# Patient Record
Sex: Female | Born: 1940 | Race: White | Hispanic: No | Smoking: Never smoker
Health system: Southern US, Community
[De-identification: ages and names within clinical notes are randomized; demographics above are authoritative.]

## PROBLEM LIST (undated history)

## (undated) DIAGNOSIS — Z8709 Personal history of other diseases of the respiratory system: Secondary | ICD-10-CM

## (undated) DIAGNOSIS — R011 Cardiac murmur, unspecified: Secondary | ICD-10-CM

## (undated) DIAGNOSIS — I350 Nonrheumatic aortic (valve) stenosis: Secondary | ICD-10-CM

## (undated) DIAGNOSIS — H269 Unspecified cataract: Secondary | ICD-10-CM

## (undated) DIAGNOSIS — Z953 Presence of xenogenic heart valve: Secondary | ICD-10-CM

## (undated) DIAGNOSIS — I5032 Chronic diastolic (congestive) heart failure: Secondary | ICD-10-CM

## (undated) DIAGNOSIS — E785 Hyperlipidemia, unspecified: Secondary | ICD-10-CM

## (undated) DIAGNOSIS — R590 Localized enlarged lymph nodes: Secondary | ICD-10-CM

## (undated) DIAGNOSIS — K759 Inflammatory liver disease, unspecified: Secondary | ICD-10-CM

## (undated) DIAGNOSIS — Q2381 Bicuspid aortic valve: Secondary | ICD-10-CM

## (undated) DIAGNOSIS — R112 Nausea with vomiting, unspecified: Secondary | ICD-10-CM

## (undated) DIAGNOSIS — Q231 Congenital insufficiency of aortic valve: Secondary | ICD-10-CM

## (undated) DIAGNOSIS — I10 Essential (primary) hypertension: Secondary | ICD-10-CM

## (undated) DIAGNOSIS — Z9889 Other specified postprocedural states: Secondary | ICD-10-CM

## (undated) DIAGNOSIS — M1990 Unspecified osteoarthritis, unspecified site: Secondary | ICD-10-CM

## (undated) HISTORY — PX: TONSILLECTOMY: SUR1361

## (undated) HISTORY — DX: Localized enlarged lymph nodes: R59.0

## (undated) HISTORY — DX: Bicuspid aortic valve: Q23.81

## (undated) HISTORY — DX: Congenital insufficiency of aortic valve: Q23.1

## (undated) HISTORY — PX: TUBAL LIGATION: SHX77

## (undated) HISTORY — PX: DILATION AND CURETTAGE OF UTERUS: SHX78

## (undated) HISTORY — DX: Nonrheumatic aortic (valve) stenosis: I35.0

## (undated) HISTORY — DX: Personal history of other diseases of the respiratory system: Z87.09

---

## 1983-06-03 HISTORY — PX: ABDOMINAL HYSTERECTOMY: SHX81

## 1983-06-03 HISTORY — PX: CARPAL TUNNEL RELEASE: SHX101

## 2010-06-02 DIAGNOSIS — R011 Cardiac murmur, unspecified: Secondary | ICD-10-CM

## 2010-06-02 HISTORY — DX: Cardiac murmur, unspecified: R01.1

## 2011-10-01 HISTORY — PX: HAMMER TOE SURGERY: SHX385

## 2012-03-16 ENCOUNTER — Encounter (INDEPENDENT_AMBULATORY_CARE_PROVIDER_SITE_OTHER): Payer: Self-pay

## 2012-04-07 ENCOUNTER — Ambulatory Visit (INDEPENDENT_AMBULATORY_CARE_PROVIDER_SITE_OTHER): Payer: Medicare Other | Admitting: Surgery

## 2012-04-07 ENCOUNTER — Encounter (INDEPENDENT_AMBULATORY_CARE_PROVIDER_SITE_OTHER): Payer: Self-pay | Admitting: Surgery

## 2012-04-07 VITALS — BP 126/80 | HR 80 | Temp 98.0°F | Resp 18 | Ht 65.0 in | Wt 201.0 lb

## 2012-04-07 DIAGNOSIS — R590 Localized enlarged lymph nodes: Secondary | ICD-10-CM | POA: Insufficient documentation

## 2012-04-07 DIAGNOSIS — R599 Enlarged lymph nodes, unspecified: Secondary | ICD-10-CM

## 2012-04-07 NOTE — Patient Instructions (Signed)
See me in six weeks for a recheck

## 2012-04-08 NOTE — Progress Notes (Signed)
NAME: [name] DOB: Jun 29, 1940 MRN: [id number]                                                                                      DATE: 04/07/2012  PCP: Arma Heading, MD Referring Provider: Arma Heading, MD  IMPRESSION:  Left supraclavicular lymphadenopathy, probably benign  PLAN:   I reviewed the options with the patient. The area is not enlarged any of the last several weeks. It is asymptomatic. It looked negative on the CT scan for malignancy. In I think the alternatives are surgical excisional biopsy or six-week followup visit and if no improvement then consider biopsy the patient was agreeable to that plan and will come back in 6 weeks                 CC:  Chief Complaint  Patient presents with  . Lymphadenopathy    Left    HPI:  Jasmin Allen is a 71 y.o.  female who presents for evaluation of left cervical supraclavicular enlarged lymph node. This was found on her routine physical by her primary physician. The patient is unaware of any enlarged lymph node was having no symptoms. He did not improve after a short course of antibiotics. A CT scan of the neck was done which showed no obvious malignancy the cervical prominent lymph nodes. Surgical consultation was requested.  PMH:  has no past medical history on file.  PSH:   has past surgical history that includes Carpal tunnel release (1985); Abdominal hysterectomy (1985); and Hammer toe surgery (10/01/2011).  ALLERGIES:   Allergies  Allergen Reactions  . Penicillins   . Sulfa Antibiotics     MEDICATIONS: Current outpatient prescriptions:aspirin 81 MG tablet, Take 81 mg by mouth daily., Disp: , Rfl: ;  b complex vitamins tablet, Take 1 tablet by mouth daily., Disp: , Rfl: ;  folic acid (FOLVITE) 400 MCG tablet, Take 400 mcg by mouth daily., Disp: , Rfl: ;  carvedilol (COREG) 6.25 MG tablet, , Disp: , Rfl: ;  hydrochlorothiazide (HYDRODIURIL) 25 MG tablet, , Disp: , Rfl: ;  losartan (COZAAR) 100 MG tablet, ,  Disp: , Rfl:  pravastatin (PRAVACHOL) 20 MG tablet, , Disp: , Rfl:   ROS: She has filled out our 12 point review of systems and it is negative . EXAM:   Vital signs:BP 126/80  Pulse 80  Temp 98 F (36.7 C) (Oral)  Resp 18  Ht 5\' 5"  (1.651 m)  Wt 201 lb (91.173 kg)  BMI 33.45 kg/m2 General: The patient is alert oriented and healthy appearing, NAD. Neck: The neck is supple and there is no thyromegaly. The thyroid gland is normal. There is no adenopathy on the right side of the neck that I can palpate either anterior posterior cervical or supraclavicular. On the left side there is a node in the left clavicular area there is approximately 1.5 cm. It is not particularly hard. It seems mobile. It is not tender. No other cervical adenopathy is noted. Axilla: there is a question of some mild fatty type lymph nodes in the right axilla. Nothing feels pathologic.  DATA REVIEWED:  I reviewed the CT scan report.  Djuna Frechette J 04/08/2012  CC: Arma Heading, MD, Arma Heading, MD

## 2012-05-19 ENCOUNTER — Ambulatory Visit (INDEPENDENT_AMBULATORY_CARE_PROVIDER_SITE_OTHER): Payer: Medicare Other | Admitting: Surgery

## 2012-05-19 ENCOUNTER — Encounter (INDEPENDENT_AMBULATORY_CARE_PROVIDER_SITE_OTHER): Payer: Self-pay | Admitting: Surgery

## 2012-05-19 VITALS — BP 136/84 | HR 72 | Temp 97.7°F | Resp 16 | Ht 65.0 in | Wt 202.2 lb

## 2012-05-19 DIAGNOSIS — R590 Localized enlarged lymph nodes: Secondary | ICD-10-CM

## 2012-05-19 DIAGNOSIS — R599 Enlarged lymph nodes, unspecified: Secondary | ICD-10-CM

## 2012-05-19 NOTE — Progress Notes (Signed)
NAME: Jasmin Allen DOB: 11/06/1940 MRN: [id number]                                                                                      DATE: 05/19/2012  PCP: Arma Heading, MD Referring Provider: Arma Heading, MD  IMPRESSION:  Left supraclavicular lymphadenopathy, probably benign  PLAN:  I again reviewed the options with the patient. There's been no change in the lymph node and after discussion we are going to set up excisional biopsy to be done as an outpatient. We discussed the risks and complications of surgery.                CC:  Chief Complaint  Patient presents with  . Mass    HPI:  Jasmin Allen is a 71 y.o.  female who presents forreevaluation of left cervical supraclavicular enlarged lymph node. This was found on her routine physical by her primary physician.I saw her 6 weeks ago and at that point thought that this was most likely benign. She comes back today for a 6 weeks followup. She does not think it has changed. He continues to be without symptoms.  PMH:  has no past medical history on file.  PSH:   has past surgical history that includes Carpal tunnel release (1985); Abdominal hysterectomy (1985); and Hammer toe surgery (10/01/2011).  ALLERGIES:   Allergies  Allergen Reactions  . Penicillins   . Sulfa Antibiotics     MEDICATIONS: Current outpatient prescriptions:aspirin 81 MG tablet, Take 81 mg by mouth daily., Disp: , Rfl: ;  b complex vitamins tablet, Take 1 tablet by mouth daily., Disp: , Rfl: ;  carvedilol (COREG) 6.25 MG tablet, , Disp: , Rfl: ;  folic acid (FOLVITE) 400 MCG tablet, Take 400 mcg by mouth daily., Disp: , Rfl: ;  hydrochlorothiazide (HYDRODIURIL) 25 MG tablet, , Disp: , Rfl: ;  losartan (COZAAR) 100 MG tablet, , Disp: , Rfl:  pravastatin (PRAVACHOL) 20 MG tablet, , Disp: , Rfl:   ROS: She has filled out our 12 point review of systems and it is negative . EXAM:   Vital signs:BP 136/84  Pulse 72  Temp 97.7 F (36.5 C)  Resp 16   Ht 5\' 5"  (1.651 m)  Wt 202 lb 3.2 oz (91.717 kg)  BMI 33.65 kg/m2 General: The patient is alert oriented and healthy appearing, NAD. Neck: The neck is supple and there is no thyromegaly. The thyroid gland is normal. There is no adenopathy on the right side of the neck that I can palpate either anterior posterior cervical or supraclavicular. On the left side there is a node in the left clavicular area there is approximately 1.5 cm. It is not particularly hard. It seems mobile. It is not tender. No other cervical adenopathy is noted. Axilla: there is a question of some mild fatty type lymph nodes in the right axilla. Nothing feels pathologic.This is the same exam as 6 weeks ago  DATA REVIEWED:  I reviewed the CT scan report.there is no evidence of malignancy although multiple chest subcentimeter nodes were noted.    Shellee Streng J 05/19/2012  CC: Arma Heading, MD, Arma Heading, MD

## 2012-05-19 NOTE — Patient Instructions (Signed)
We will schedule surgery to remove this small but enlarged lymph node from the left side of her neck

## 2012-06-09 ENCOUNTER — Encounter (HOSPITAL_BASED_OUTPATIENT_CLINIC_OR_DEPARTMENT_OTHER): Payer: Self-pay | Admitting: *Deleted

## 2012-06-09 NOTE — Progress Notes (Signed)
Pt lives [place]-cannot come into [place]-will need to come 2 hr early for bmet-ekg-will get notes from dr Samson Frederic pcp

## 2012-06-14 ENCOUNTER — Encounter (HOSPITAL_BASED_OUTPATIENT_CLINIC_OR_DEPARTMENT_OTHER): Payer: Self-pay | Admitting: Anesthesiology

## 2012-06-14 ENCOUNTER — Ambulatory Visit (HOSPITAL_BASED_OUTPATIENT_CLINIC_OR_DEPARTMENT_OTHER)
Admission: RE | Admit: 2012-06-14 | Discharge: 2012-06-14 | Disposition: A | Discharge status: Home / Self Care | Payer: Medicare Other | Source: Ambulatory Visit | Attending: Surgery | Admitting: Surgery

## 2012-06-14 ENCOUNTER — Ambulatory Visit (HOSPITAL_BASED_OUTPATIENT_CLINIC_OR_DEPARTMENT_OTHER): Payer: Medicare Other | Admitting: Anesthesiology

## 2012-06-14 ENCOUNTER — Encounter (HOSPITAL_BASED_OUTPATIENT_CLINIC_OR_DEPARTMENT_OTHER): Payer: Self-pay | Admitting: *Deleted

## 2012-06-14 ENCOUNTER — Encounter (HOSPITAL_BASED_OUTPATIENT_CLINIC_OR_DEPARTMENT_OTHER)
Admission: RE | Disposition: A | Discharge status: Home / Self Care | Payer: Self-pay | Source: Ambulatory Visit | Attending: Surgery

## 2012-06-14 DIAGNOSIS — Z7982 Long term (current) use of aspirin: Secondary | ICD-10-CM | POA: Insufficient documentation

## 2012-06-14 DIAGNOSIS — R590 Localized enlarged lymph nodes: Secondary | ICD-10-CM

## 2012-06-14 DIAGNOSIS — I10 Essential (primary) hypertension: Secondary | ICD-10-CM | POA: Insufficient documentation

## 2012-06-14 DIAGNOSIS — Z79899 Other long term (current) drug therapy: Secondary | ICD-10-CM | POA: Insufficient documentation

## 2012-06-14 DIAGNOSIS — Z6833 Body mass index (BMI) 33.0-33.9, adult: Secondary | ICD-10-CM | POA: Insufficient documentation

## 2012-06-14 DIAGNOSIS — R599 Enlarged lymph nodes, unspecified: Secondary | ICD-10-CM | POA: Insufficient documentation

## 2012-06-14 DIAGNOSIS — R011 Cardiac murmur, unspecified: Secondary | ICD-10-CM | POA: Insufficient documentation

## 2012-06-14 HISTORY — DX: Unspecified osteoarthritis, unspecified site: M19.90

## 2012-06-14 HISTORY — DX: Essential (primary) hypertension: I10

## 2012-06-14 HISTORY — DX: Hyperlipidemia, unspecified: E78.5

## 2012-06-14 HISTORY — DX: Cardiac murmur, unspecified: R01.1

## 2012-06-14 HISTORY — PX: LYMPH NODE BIOPSY: SHX201

## 2012-06-14 SURGERY — LYMPH NODE BIOPSY
Anesthesia: Monitor Anesthesia Care | Site: Neck | Laterality: Left | Wound class: Clean

## 2012-06-14 MED ORDER — ONDANSETRON HCL 4 MG/2ML IJ SOLN
INTRAMUSCULAR | Status: DC | PRN
  Administered 2012-06-14: 4 mg via INTRAVENOUS

## 2012-06-14 MED ORDER — FENTANYL CITRATE 0.05 MG/ML IJ SOLN: 25.0000 ug | INTRAMUSCULAR | Status: DC | PRN

## 2012-06-14 MED ORDER — MEPERIDINE HCL 25 MG/ML IJ SOLN: 6.2500 mg | INTRAMUSCULAR | Status: DC | PRN

## 2012-06-14 MED ORDER — LACTATED RINGERS IV SOLN
INTRAVENOUS | Status: DC
  Administered 2012-06-14: 10:00:00 via INTRAVENOUS

## 2012-06-14 MED ORDER — MIDAZOLAM HCL 5 MG/5ML IJ SOLN
INTRAMUSCULAR | Status: DC | PRN
  Administered 2012-06-14: 2 mg via INTRAVENOUS

## 2012-06-14 MED ORDER — OXYCODONE HCL 5 MG/5ML PO SOLN: 5.0000 mg | Freq: Once | ORAL | Status: DC | PRN

## 2012-06-14 MED ORDER — LIDOCAINE-EPINEPHRINE (PF) 1 %-1:200000 IJ SOLN
INTRAMUSCULAR | Status: DC | PRN
  Administered 2012-06-14: 10 mL

## 2012-06-14 MED ORDER — CHLORHEXIDINE GLUCONATE 4 % EX LIQD: 1.0000 "application " | Freq: Once | CUTANEOUS | Status: DC

## 2012-06-14 MED ORDER — FENTANYL CITRATE 0.05 MG/ML IJ SOLN
INTRAMUSCULAR | Status: DC | PRN
  Administered 2012-06-14 (×2): 50 ug via INTRAVENOUS

## 2012-06-14 MED ORDER — PROPOFOL 10 MG/ML IV EMUL
INTRAVENOUS | Status: DC | PRN
  Administered 2012-06-14: 75 ug/kg/min via INTRAVENOUS

## 2012-06-14 MED ORDER — MIDAZOLAM HCL 2 MG/2ML IJ SOLN: 0.5000 mg | Freq: Once | INTRAMUSCULAR | Status: DC | PRN

## 2012-06-14 MED ORDER — EPHEDRINE SULFATE 50 MG/ML IJ SOLN
INTRAMUSCULAR | Status: DC | PRN
  Administered 2012-06-14: 10 mg via INTRAVENOUS

## 2012-06-14 MED ORDER — OXYCODONE HCL 5 MG PO TABS: 5.0000 mg | ORAL_TABLET | Freq: Once | ORAL | Status: DC | PRN

## 2012-06-14 MED ORDER — PROMETHAZINE HCL 25 MG/ML IJ SOLN: 6.2500 mg | INTRAMUSCULAR | Status: DC | PRN

## 2012-06-14 SURGICAL SUPPLY — 44 items
BANDAGE ELASTIC 6 VELCRO ST LF (GAUZE/BANDAGES/DRESSINGS) ×2 IMPLANT
BLADE HEX COATED 2.75 (ELECTRODE) ×2 IMPLANT
BLADE SURG 15 STRL LF DISP TIS (BLADE) ×1 IMPLANT
BLADE SURG 15 STRL SS (BLADE) ×1
CANISTER SUCTION 1200CC (MISCELLANEOUS) IMPLANT
CHLORAPREP W/TINT 26ML (MISCELLANEOUS) ×2 IMPLANT
CLIP TI MEDIUM 6 (CLIP) IMPLANT
CLIP TI WIDE RED SMALL 6 (CLIP) IMPLANT
CLOTH BEACON ORANGE TIMEOUT ST (SAFETY) ×2 IMPLANT
COVER MAYO STAND STRL (DRAPES) ×2 IMPLANT
COVER TABLE BACK 60X90 (DRAPES) ×2 IMPLANT
DECANTER SPIKE VIAL GLASS SM (MISCELLANEOUS) ×2 IMPLANT
DERMABOND ADVANCED (GAUZE/BANDAGES/DRESSINGS) ×1
DERMABOND ADVANCED .7 DNX12 (GAUZE/BANDAGES/DRESSINGS) ×1 IMPLANT
DRAPE LAPAROTOMY TRNSV 102X78 (DRAPE) ×2 IMPLANT
DRAPE UTILITY XL STRL (DRAPES) ×2 IMPLANT
ELECT REM PT RETURN 9FT ADLT (ELECTROSURGICAL) ×2
ELECTRODE REM PT RTRN 9FT ADLT (ELECTROSURGICAL) ×1 IMPLANT
GLOVE BIOGEL PI IND STRL 7.0 (GLOVE) ×1 IMPLANT
GLOVE BIOGEL PI INDICATOR 7.0 (GLOVE) ×1
GLOVE ECLIPSE 7.0 STRL STRAW (GLOVE) ×4 IMPLANT
GLOVE EUDERMIC 7 POWDERFREE (GLOVE) ×2 IMPLANT
GOWN PREVENTION PLUS XLARGE (GOWN DISPOSABLE) ×6 IMPLANT
NEEDLE HYPO 25X1 1.5 SAFETY (NEEDLE) ×2 IMPLANT
NS IRRIG 1000ML POUR BTL (IV SOLUTION) IMPLANT
PACK BASIN DAY SURGERY FS (CUSTOM PROCEDURE TRAY) ×2 IMPLANT
PEN SKIN MARKING BROAD TIP (MISCELLANEOUS) IMPLANT
PENCIL BUTTON HOLSTER BLD 10FT (ELECTRODE) ×2 IMPLANT
SLEEVE SCD COMPRESS KNEE MED (MISCELLANEOUS) IMPLANT
SPONGE INTESTINAL PEANUT (DISPOSABLE) IMPLANT
SPONGE LAP 4X18 X RAY DECT (DISPOSABLE) ×2 IMPLANT
STAPLER VISISTAT 35W (STAPLE) IMPLANT
SUT MNCRL AB 4-0 PS2 18 (SUTURE) ×2 IMPLANT
SUT VIC AB 3-0 CT1 27 (SUTURE) ×1
SUT VIC AB 3-0 CT1 27XBRD (SUTURE) ×1 IMPLANT
SUT VIC AB 4-0 BRD 54 (SUTURE) ×2 IMPLANT
SUT VICRYL 3-0 CR8 SH (SUTURE) IMPLANT
SYR CONTROL 10ML LL (SYRINGE) ×2 IMPLANT
TAPE HYPAFIX 4 X10 (GAUZE/BANDAGES/DRESSINGS) IMPLANT
TOWEL OR 17X24 6PK STRL BLUE (TOWEL DISPOSABLE) ×2 IMPLANT
TOWEL OR NON WOVEN STRL DISP B (DISPOSABLE) ×2 IMPLANT
TUBE CONNECTING 20X1/4 (TUBING) IMPLANT
WATER STERILE IRR 1000ML POUR (IV SOLUTION) ×2 IMPLANT
YANKAUER SUCT BULB TIP NO VENT (SUCTIONS) IMPLANT

## 2012-06-14 NOTE — Interval H&P Note (Signed)
History and Physical Interval Note:  06/14/2012 10:30 AM  Jasmin Allen  has presented today for surgery, with the diagnosis of lymphadenopathy  The various methods of treatment have been discussed with the patient and family. After consideration of risks, benefits and other options for treatment, the patient has consented to  Procedure(s) (LRB) with comments: LYMPH NODE BIOPSY (Left) - remove left neck lymph node as a surgical intervention .  The patient's history has been reviewed, patient examined, no change in status, stable for surgery.  I have reviewed the patient's chart and labs.  Questions were answered to the patient's satisfaction.   I located the lymph node and marked the skin directly over it for the incision  [name]

## 2012-06-14 NOTE — Transfer of Care (Signed)
Immediate Anesthesia Transfer of Care Note  Patient: Jasmin Allen  Procedure(s) Performed: Procedure(s) (LRB) with comments: LYMPH NODE BIOPSY (Left) - remove left neck lymph node  Patient Location: PACU  Anesthesia Type:MAC  Level of Consciousness: awake, alert  and oriented  Airway & Oxygen Therapy: Patient Spontanous Breathing and Patient connected to face mask oxygen  Post-op Assessment: Report given to PACU RN and Post -op Vital signs reviewed and stable  Post vital signs: Reviewed and stable  Complications: No apparent anesthesia complications

## 2012-06-14 NOTE — Op Note (Signed)
Jasmin Allen [name] Oct 03, 1940 [id number] 05/19/2012  Preoperative diagnosis: enlarged left posterior cervical lymph node  Postoperative diagnosis: same  Procedure: excision of cervical lymph node  Surgeon: Currie Paris, MD, FACS   Anesthesia: MAC   Clinical History and Indications: this patient has a persistent fairly well circumscribed left cervical mass consistent with enlarged lymph node. We have followed this for a time and has now elected to do excisional biopsy. Identified the mass the preoperative area and marked with the patient's cooperation.    Description of Procedure: the patient was taken to the operating room and given IV sedation. With her position on the operating room table the mass was a little bit more posterior than my original marked but still in the same skin crease. After a prep and drape in a time out the area was anesthetized with 1% Xylocaine with epinephrine. I made a curved incision directly over the mass. It was also placed to only with a hemostat. I was able to palpate the mass and grasp it with the forceps and excised it. It is somewhat firm, but grossly was very yellow and could represent either a fat infiltrated lymph node or perhaps a lipoma. Careful palpation revealed no other masses and I believe this is the one that we had palpated preoperatively.  The incision was closed this with 3-0 Vicryl, 4-0 Monocryl subcuticular, plus Dermabond. She tolerated the procedure well. There were no complications. Counts were correct.  Currie Paris, MD, FACS 06/14/2012 11:33 AM

## 2012-06-14 NOTE — H&P (View-Only) (Signed)
NAME: Jasmin Allen DOB: 07/26/1940 MRN: [id number]                                                                                      DATE: 05/19/2012  PCP: [name],[name], MD Referring Provider: [name], [name], MD  IMPRESSION:  Left supraclavicular lymphadenopathy, probably benign  PLAN:  I again reviewed the options with the patient. There's been no change in the lymph node and after discussion we are going to set up excisional biopsy to be done as an outpatient. We discussed the risks and complications of surgery.                CC:  Chief Complaint  Patient presents with  . Mass    HPI:  Jasmin Allen is a 71 y.o.  female who presents forreevaluation of left cervical supraclavicular enlarged lymph node. This was found on her routine physical by her primary physician.I saw her 6 weeks ago and at that point thought that this was most likely benign. She comes back today for a 6 weeks followup. She does not think it has changed. He continues to be without symptoms.  PMH:  has no past medical history on file.  PSH:   has past surgical history that includes Carpal tunnel release (1985); Abdominal hysterectomy (1985); and Hammer toe surgery (10/01/2011).  ALLERGIES:   Allergies  Allergen Reactions  . Penicillins   . Sulfa Antibiotics     MEDICATIONS: Current outpatient prescriptions:aspirin 81 MG tablet, Take 81 mg by mouth daily., Disp: , Rfl: ;  b complex vitamins tablet, Take 1 tablet by mouth daily., Disp: , Rfl: ;  carvedilol (COREG) 6.25 MG tablet, , Disp: , Rfl: ;  folic acid (FOLVITE) 400 MCG tablet, Take 400 mcg by mouth daily., Disp: , Rfl: ;  hydrochlorothiazide (HYDRODIURIL) 25 MG tablet, , Disp: , Rfl: ;  losartan (COZAAR) 100 MG tablet, , Disp: , Rfl:  pravastatin (PRAVACHOL) 20 MG tablet, , Disp: , Rfl:   ROS: She has filled out our 12 point review of systems and it is negative . EXAM:   Vital signs:BP 136/84  Pulse 72  Temp 97.7 F (36.5 C)  Resp 16   Ht 5' 5" (1.651 m)  Wt 202 lb 3.2 oz (91.717 kg)  BMI 33.65 kg/m2 General: The patient is alert oriented and healthy appearing, NAD. Neck: The neck is supple and there is no thyromegaly. The thyroid gland is normal. There is no adenopathy on the right side of the neck that I can palpate either anterior posterior cervical or supraclavicular. On the left side there is a node in the left clavicular area there is approximately 1.5 cm. It is not particularly hard. It seems mobile. It is not tender. No other cervical adenopathy is noted. Axilla: there is a question of some mild fatty type lymph nodes in the right axilla. Nothing feels pathologic.This is the same exam as 6 weeks ago  DATA REVIEWED:  I reviewed the CT scan report.there is no evidence of malignancy although multiple chest subcentimeter nodes were noted.    Jasmin Allen 05/19/2012  CC: [name], [name], MD, [name],[name], MD        

## 2012-06-14 NOTE — Anesthesia Preprocedure Evaluation (Addendum)
Anesthesia Evaluation  Patient identified by MRN, date of birth, ID band Patient awake    Reviewed: Allergy & Precautions, H&P , NPO status , Patient's Chart, lab work & pertinent test results, reviewed documented beta blocker date and time   Airway Mallampati: II TM Distance: >3 FB Neck ROM: Full    Dental  (+) Teeth Intact, Dental Advisory Given and Caps   Pulmonary neg pulmonary ROS,  breath sounds clear to auscultation  Pulmonary exam normal       Cardiovascular hypertension, Pt. on medications and Pt. on home beta blockers + Valvular Problems/Murmurs (8/12 ECHO: possible bicuspid aortic valve, mod AS, normal LVF) AS Rhythm:Regular Rate:Normal + Systolic murmurs (III/VI SEM)    Neuro/Psych negative neurological ROS  negative psych ROS   GI/Hepatic negative GI ROS, Neg liver ROS,   Endo/Other  Morbid obesity  Renal/GU negative Renal ROS     Musculoskeletal   Abdominal (+) + obese,   Peds  Hematology negative hematology ROS (+)   Anesthesia Other Findings   Reproductive/Obstetrics                          Anesthesia Physical Anesthesia Plan  ASA: III  Anesthesia Plan: MAC   Post-op Pain Management:    Induction: Intravenous  Airway Management Planned: Natural Airway and Simple Face Mask  Additional Equipment:   Intra-op Plan:   Post-operative Plan:   Informed Consent: I have reviewed the patients History and Physical, chart, labs and discussed the procedure including the risks, benefits and alternatives for the proposed anesthesia with the patient or authorized representative who has indicated his/her understanding and acceptance.   Dental advisory given  Plan Discussed with: CRNA and Surgeon  Anesthesia Plan Comments: (Plan routine monitors, MAC)       Anesthesia Quick Evaluation

## 2012-06-14 NOTE — Anesthesia Postprocedure Evaluation (Signed)
  Anesthesia Post-op Note  Patient: Jasmin Allen  Procedure(s) Performed: Procedure(s) (LRB) with comments: LYMPH NODE BIOPSY (Left) - remove left neck lymph node  Patient Location: PACU  Anesthesia Type:MAC  Level of Consciousness: awake, alert , oriented and patient cooperative  Airway and Oxygen Therapy: Patient Spontanous Breathing  Post-op Pain: none  Post-op Assessment: Post-op Vital signs reviewed, Patient's Cardiovascular Status Stable, Respiratory Function Stable, Patent Airway, No signs of Nausea or vomiting and Pain level controlled  Post-op Vital Signs: Reviewed and stable  Complications: No apparent anesthesia complications

## 2012-06-15 ENCOUNTER — Encounter (HOSPITAL_BASED_OUTPATIENT_CLINIC_OR_DEPARTMENT_OTHER): Payer: Self-pay | Admitting: Surgery

## 2012-06-15 LAB — POCT I-STAT, CHEM 8
BUN: 15 mg/dL (ref 6–23)
Chloride: 102 mEq/L (ref 96–112)
Potassium: 4.1 mEq/L (ref 3.5–5.1)
Sodium: 141 mEq/L (ref 135–145)
TCO2: 29 mmol/L (ref 0–100)

## 2012-06-17 ENCOUNTER — Telehealth (INDEPENDENT_AMBULATORY_CARE_PROVIDER_SITE_OTHER): Payer: Self-pay | Admitting: General Surgery

## 2012-06-17 NOTE — Telephone Encounter (Signed)
Patient made aware we do not have a pathology report yet. They are sending for additional testing on the specimen and we will call her when we get a report.

## 2012-06-29 ENCOUNTER — Encounter (INDEPENDENT_AMBULATORY_CARE_PROVIDER_SITE_OTHER): Payer: Self-pay | Admitting: Surgery

## 2012-06-29 ENCOUNTER — Telehealth (INDEPENDENT_AMBULATORY_CARE_PROVIDER_SITE_OTHER): Payer: Self-pay

## 2012-06-29 ENCOUNTER — Ambulatory Visit (INDEPENDENT_AMBULATORY_CARE_PROVIDER_SITE_OTHER): Payer: Medicare Other | Admitting: Surgery

## 2012-06-29 VITALS — BP 130/82 | HR 67 | Temp 97.0°F | Resp 18 | Ht 65.0 in | Wt 200.8 lb

## 2012-06-29 DIAGNOSIS — R599 Enlarged lymph nodes, unspecified: Secondary | ICD-10-CM

## 2012-06-29 DIAGNOSIS — R591 Generalized enlarged lymph nodes: Secondary | ICD-10-CM

## 2012-06-29 NOTE — Patient Instructions (Signed)
We will see you again on an as needed basis. Please call the office at [phone] if you have any questions or concerns. Thank you for allowing [place] to take care of you.  We will arrange a consultation with Dr Mariel Sleet in [place]. His office should call you with an appointment time

## 2012-06-29 NOTE — Progress Notes (Signed)
[name] Allen [facility]                                            DOB: 1941/02/10 DATE: 06/29/2012                                                  MRN: [id number]  CC:  Chief Complaint  Patient presents with  . Routine Post Op    lt neck lymph node    HPI: This patient comes in for post op follow-up .Sheunderwent REMOVAL OF ENLARGED LEFT CERVICAL lLymph node on 06/14/12. She feels that she is doing well.  PE:  VITAL SIGNS: BP 130/82  Pulse 67  Temp 97 F (36.1 C) (Temporal)  Resp 18  Ht 5\' 5"  (1.651 m)  Wt 200 lb 12.8 oz (91.082 kg)  BMI 33.41 kg/m2  General: The patient appears to be healthy, NAD Incision healing nicely  DATA REVIEWED:PaTH:  ADDITIONAL INFORMATION: PCR for immunoglobulin heavy chain gene and BCL-2 gene were performed on paraffin embedded tissue at the [facility] in [place], [place]. Both results are negative. Despite the negative results, close clinical follow-up and excisional biopsy is recommended if adenopathy is persistent or recurrent. 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 Microscopic Comment The sections show partial effacement of the lymph node architecture by an atypical lymphoid proliferation characterized by predominance of small to medium sized lymphoid cells with round to slightly irregular nuclei, dense chromatin, and small to inconspicuous nucleoli. The atypical lymphoid cells show scanty to moderately abundant cytoplasm which often appears clear giving a monocytoid appearance to many of the atypical cells. The atypical lymphoid cells form nodules and intersecting bands as well as small diffuse areas which occasionally surround variably sized but reactive appearing germinal centers. Obvious proliferation centers  or atypical follicular structures are not seen. No necrosis or increased mitosis is seen. In this background, scattered small foci of paracortical, reactive appearing tissue with scattered histiocytes are also seen. Flow cytometric analysis was performed but failed to show any monoclonal B cell population or abnormal B cell phenotype. Immunohistochemical stains for CD20, CD21, CD79a, CD3, CD43, CD5, CD10, Cyclin D1, BCL2, and BCL6 were performed with appropriate controls. The atypical lymphoid cells in question stain positively for B cell markers CD79a and CD20 in addition to staining for BCL2 protein. The cells in question are negative for Cyclin D1, CD5, CD21, CD10, and BCL6. CD21 in particular highlights many of the follicular dendritic networks throughout the lymph node. CD3, CD43 and CD5 highlights the admixed T cell component 1 of 2 FINAL for Jasmin Allen, Jasmin Allen [phone]) Microscopic Comment(continued) present in the background. BCL-2 is negative in scattered germinal centers present. The morphologic and immunoperoxidase findings are highly suspicious for partial involvement by low grade non-Hodgkin's B cell lymphoma, particularly marginal zone lymphoma. However I am unable to confirm this suspicion by flow cytometric analysis which essentially shows a polyclonal B cell population. The latter discrepancy may be due to sampling. Gene rearrangement study on paraffin embedded tissue will be performed and the results reported in an addendum. (BNS:kh 06-17-12)  Guerry Bruin MD Pathologist, Electronic Signature (Case signed 06/17/2012) Interpretation Tissue-Flow Cytometry - NO MONOCLONAL B-CELL POPULATION OR ABNORMAL B-CELL PHENOTYPE IDENTIFIED. - T-CELLS WITH A PREDOMINANT HELPER PHENOTYPE (NONSPECIFIC) Guerry Bruin MD Pathologist, Electronic Signature (Case signed 06/17/2012) GROSS AND MICROSCOPIC INFORMATION Source Tissue-Flow Cytometry Microscopic Gated population: Flow cytometric  immunophenotyping is performed using antibiodies to the antigens listed in the table below. Electronic gates are placed around a cell cluster displaying light scatter properties corresponding to lymphocytes. - Abnormal Cells in gated population: NA - Phenotype of Abnormal Cells: NA 1 of 2 FINAL for Jasmin Allen, Jasmin Allen Allen [phone]) Specimen Table Lymphoid Associated Myeloid Associated Misc. As CD2 tested CD19 tested CD11c ND CD45 ND CD3 tested CD20 tested CD13 ND HLA-DR tested CD4 tested CD21 tested CD14 ND CD10 tested CD5 tested CD22 tested CD15 ND CD56/16 ND CD7 tested CD23 tested CD33 ND ZAP70 ND CD8 tested CD103 ND MPO ND CD34 ND CD25 ND FMC7 ND CD117 ND CD52 ND sKappa tested CD38 ND sLambda tested 7AAD tested cKappa ND cLambda ND Gross ref. [phone] All controls stained appropriately. The above tests were developed and their performance characteristics determined by the [facility]. They have not been cleared or approved by the U.S. Food and Drug Administration." Report signed from the following location(s) [facility] [address], [place], [place] [address]. CLIA #: [id number],  IMPRESSION: The patient is doing well S/P LN biopsy.    PLAN: RTC PRN Gave her a copy of path and reviewed it and make recommendation for oncology follow up

## 2012-06-29 NOTE — Telephone Encounter (Signed)
LMOM giving the pt her new appt with Dr Mariel Sleet for 07/09/12 arrive at 3:15/3:30.

## 2012-07-09 ENCOUNTER — Encounter (HOSPITAL_COMMUNITY): Payer: Medicare Other | Attending: Oncology | Admitting: Oncology

## 2012-07-09 ENCOUNTER — Encounter (HOSPITAL_COMMUNITY): Payer: Self-pay | Admitting: Oncology

## 2012-07-09 VITALS — BP 127/79 | HR 69 | Temp 97.9°F | Resp 18 | Ht 64.75 in | Wt 198.5 lb

## 2012-07-09 DIAGNOSIS — R161 Splenomegaly, not elsewhere classified: Secondary | ICD-10-CM

## 2012-07-09 DIAGNOSIS — I10 Essential (primary) hypertension: Secondary | ICD-10-CM | POA: Insufficient documentation

## 2012-07-09 DIAGNOSIS — E7800 Pure hypercholesterolemia, unspecified: Secondary | ICD-10-CM | POA: Insufficient documentation

## 2012-07-09 DIAGNOSIS — R590 Localized enlarged lymph nodes: Secondary | ICD-10-CM

## 2012-07-09 DIAGNOSIS — R599 Enlarged lymph nodes, unspecified: Secondary | ICD-10-CM | POA: Insufficient documentation

## 2012-07-09 LAB — COMPREHENSIVE METABOLIC PANEL
ALT: 15 U/L (ref 0–35)
Alkaline Phosphatase: 99 U/L (ref 39–117)
CO2: 31 mEq/L (ref 19–32)
GFR calc Af Amer: 82 mL/min — ABNORMAL LOW (ref >90)
GFR calc non Af Amer: 71 mL/min — ABNORMAL LOW (ref >90)
Glucose, Bld: 88 mg/dL (ref 70–99)
Potassium: 4 mEq/L (ref 3.5–5.1)
Sodium: 138 mEq/L (ref 135–145)

## 2012-07-09 LAB — CBC WITH DIFFERENTIAL/PLATELET
Eosinophils Relative: 0 % (ref 0–5)
Hemoglobin: 12.6 g/dL (ref 12.0–15.0)
Lymphocytes Relative: 36 % (ref 12–46)
Lymphs Abs: 1.7 10*3/uL (ref 0.7–4.0)
MCV: 93.4 fL (ref 78.0–100.0)
Monocytes Relative: 6 % (ref 3–12)
Neutrophils Relative %: 58 % (ref 43–77)
Platelets: 176 10*3/uL (ref 150–400)
RBC: 3.79 MIL/uL — ABNORMAL LOW (ref 3.87–5.11)
WBC: 4.8 10*3/uL (ref 4.0–10.5)

## 2012-07-09 NOTE — Progress Notes (Signed)
Jasmin Allen presented for labwork. Labs per MD order drawn via Peripheral Line 23 gauge needle inserted in right AC.  Good blood return present. Procedure without incident.  Needle removed intact. Patient tolerated procedure well.

## 2012-07-09 NOTE — Progress Notes (Signed)
Problem number 1 possible lymphoma Jasmin Allen 72 year old lady who was found by her primary care physician in August have a mildly enlarged lymph node in the left supraclavicular fossa. She was treated with an antibiotic and reevaluated. It persisted. She was then referred in the fall to Dr. Cicero Duck. He also observed for 6 weeks still with persistence of disease. This was confirmed by CT scan done in [place]. He therefore proceeded with resection. It was very suspicious but not diagnostic of lymphoma. Special stains and consultation were also performed, consultation at the [facility] [place]. She does not have B. symptomatology. She is not aware of any other lymph nodes. She also has a history of hypertension, hypercholesterolemia, and excessive weight. She also takes folic acid on a daily basis. She was not however aware of folic acid deficiency specifically. She had one episode Christmas 2013 where she had a low-grade temperature 100.1 degrees for less than 24 hours.  She is accompanied by her husband. She has one son. He is in good health.  BP 127/79  Pulse 69  Temp 97.9 F (36.6 C) (Oral)  Resp 18  Ht 5' 4.75" (1.645 m)  Wt 198 lb 8 oz (90.039 kg)  BMI 33.29 kg/m2  She is in no acute distress. She is alert and oriented but clearly overweight for her height. She has no palpable lymphadenopathy in the cervical, supraclavicular, infraclavicular, axillary, epitrochlear, or inguinal areas. Her left supraclavicular incision is well-healed. Breast exam is negative for masses. Her lungs are clear to auscultation and percussion. Skin exam is unremarkable. Abdomen is obese without hepatomegaly but she clearly has a spleen tip palpable in the left upper quadrant both supine and in the right lateral decubitus position. Bowel sounds are normal. Heart exam does not reveal a murmur rub or gallop. She has no leg edema. Pulses 1+ and symmetrical. She is right handed. HEENT exam is unremarkable. Pupils  are equally round and reactive to light. There is a question of early cataracts bilaterally. She has no enlargement of tonsillar tissue.  I think this lady deserves a PET scan. She knows that she does not have an absolute diagnosis of lymphoma at this time. She does know that I felt her spleen tip. That certainly could be enlarged for other reasons such as fatty infiltration of the liver but I think we need to exclude lymphomatous involvement elsewhere in the body. We will do some blood work today and see her back in a couple weeks. She and her husband are both agreeable to this plan of action at this time

## 2012-07-09 NOTE — Patient Instructions (Addendum)
[facility] Discharge Instructions  RECOMMENDATIONS MADE BY THE CONSULTANT AND ANY TEST RESULTS WILL BE SENT TO YOUR REFERRING PHYSICIAN.  EXAM FINDINGS BY THE PHYSICIAN TODAY AND SIGNS OR SYMPTOMS TO REPORT TO CLINIC OR PRIMARY PHYSICIAN: Exam and discussion by MD.  Bonita Quin don't have a definite diagnosis of  Lymphoma.  Your lymph node was very suspicious but did not definitely show lymphoma.  Need to do some additional testing.  Your spleen was palpable upon exam but your spleen can be large due to other reasons.  Will check some blood work today and do PET Scan to see if there's anything that shows up.  MEDICATIONS PRESCRIBED:  none     SPECIAL INSTRUCTIONS/FOLLOW-UP: For PET scan - nothing to eat or drink for 6 hours prior to the scan.  No gum, candy or anything with sugar. Follow-up in 2 weeks.  Thank you for choosing [facility] to provide your oncology and hematology care.  To afford each patient quality time with our providers, please arrive at least 15 minutes before your scheduled appointment time.  With your help, our goal is to use those 15 minutes to complete the necessary work-up to ensure our physicians have the information they need to help with your evaluation and healthcare recommendations.    Effective January 1st, 2014, we ask that you re-schedule your appointment with our physicians should you arrive 10 or more minutes late for your appointment.  We strive to give you quality time with our providers, and arriving late affects you and other patients whose appointments are after yours.    Again, thank you for choosing [facility].  Our hope is that these requests will decrease the amount of time that you wait before being seen by our physicians.       _____________________________________________________________  Should you have questions after your visit to [facility], please contact our office at [phone]  between the hours of 8:30 a.m. and 5:00 p.m.  Voicemails left after 4:30 p.m. will not be returned until the following business day.  For prescription refill requests, have your pharmacy contact our office with your prescription refill request.

## 2012-07-20 ENCOUNTER — Ambulatory Visit (HOSPITAL_COMMUNITY)
Admission: RE | Admit: 2012-07-20 | Discharge: 2012-07-20 | Disposition: A | Discharge status: Home / Self Care | Payer: Medicare Other | Source: Ambulatory Visit | Attending: Oncology | Admitting: Oncology

## 2012-07-20 DIAGNOSIS — K449 Diaphragmatic hernia without obstruction or gangrene: Secondary | ICD-10-CM | POA: Insufficient documentation

## 2012-07-20 DIAGNOSIS — R590 Localized enlarged lymph nodes: Secondary | ICD-10-CM

## 2012-07-20 DIAGNOSIS — R599 Enlarged lymph nodes, unspecified: Secondary | ICD-10-CM | POA: Insufficient documentation

## 2012-07-20 DIAGNOSIS — C8589 Other specified types of non-Hodgkin lymphoma, extranodal and solid organ sites: Secondary | ICD-10-CM | POA: Insufficient documentation

## 2012-07-20 DIAGNOSIS — R161 Splenomegaly, not elsewhere classified: Secondary | ICD-10-CM

## 2012-07-20 MED ORDER — FLUDEOXYGLUCOSE F - 18 (FDG) INJECTION
19.2000 | Freq: Once | INTRAVENOUS | Status: AC | PRN
Start: 2012-07-20 — End: 2012-07-20
  Administered 2012-07-20: 19.2 via INTRAVENOUS

## 2012-07-26 ENCOUNTER — Encounter (HOSPITAL_COMMUNITY): Payer: Self-pay | Admitting: Oncology

## 2012-07-26 ENCOUNTER — Encounter (HOSPITAL_BASED_OUTPATIENT_CLINIC_OR_DEPARTMENT_OTHER): Payer: Medicare Other | Admitting: Oncology

## 2012-07-26 VITALS — BP 109/72 | HR 74 | Temp 98.2°F | Resp 18 | Wt 200.6 lb

## 2012-07-26 DIAGNOSIS — R590 Localized enlarged lymph nodes: Secondary | ICD-10-CM

## 2012-07-26 NOTE — Patient Instructions (Addendum)
[facility] Discharge Instructions  RECOMMENDATIONS MADE BY THE CONSULTANT AND ANY TEST RESULTS WILL BE SENT TO YOUR REFERRING PHYSICIAN.  EXAM FINDINGS BY THE PHYSICIAN TODAY AND SIGNS OR SYMPTOMS TO REPORT TO CLINIC OR PRIMARY PHYSICIAN: Test results discussed by MD.  Will just watch you.  MEDICATIONS PRESCRIBED:  none  INSTRUCTIONS GIVEN AND DISCUSSED: Report any new lumps, night sweats, recurring infections, fevers.  SPECIAL INSTRUCTIONS/FOLLOW-UP: Follow-up in May with blood work and MD appointment.  Thank you for choosing [facility] to provide your oncology and hematology care.  To afford each patient quality time with our providers, please arrive at least 15 minutes before your scheduled appointment time.  With your help, our goal is to use those 15 minutes to complete the necessary work-up to ensure our physicians have the information they need to help with your evaluation and healthcare recommendations.    Effective January 1st, 2014, we ask that you re-schedule your appointment with our physicians should you arrive 10 or more minutes late for your appointment.  We strive to give you quality time with our providers, and arriving late affects you and other patients whose appointments are after yours.    Again, thank you for choosing [facility].  Our hope is that these requests will decrease the amount of time that you wait before being seen by our physicians.       _____________________________________________________________  Should you have questions after your visit to [facility], please contact our office at [phone] between the hours of 8:30 a.m. and 5:00 p.m.  Voicemails left after 4:30 p.m. will not be returned until the following business day.  For prescription refill requests, have your pharmacy contact our office with your prescription refill request.

## 2012-07-26 NOTE — Progress Notes (Signed)
#  1 possible lymphoma presenting with a mildly enlarged lymph node in the left supraclavicular fossa status post removal after was found initially in August of 2013 by her primary care physician. Date of removal was 06/14/2012. She has no B. symptomatology. She had one day namely Christmas 2013 of a low-grade temperature 100.1 degrees and one sweat at night which was overall body and none since and then before. PET scan the other day shows no evidence for enlargement or abnormal activity. Her spleen is not radiologically enlarged even though the tip was felt on physical exam. Laboratory work was excellent on 07/09/2012. Therefore we have 2 options one is to proceed with a bone marrow aspirate and biopsy to look for lymphoma in this regard. The other option is to watch her bring her back in 12 weeks with repeat blood work and a physical exam. I think if this was a high-grade lymphoma it would have declared itself easily by now. If it is a low-grade lymphoma then there may not be any need to treat her now or in the near future. If this is a reactive node then there will be no reason to treat her at all. After some discussion we have decided to just observe her every her back in 12 weeks. She knows what symptoms and signs to look for. She and her husband are happy with this decision as am I. We will see her in May

## 2012-10-05 ENCOUNTER — Encounter (HOSPITAL_COMMUNITY): Payer: Medicare Other | Attending: Oncology

## 2012-10-05 DIAGNOSIS — R590 Localized enlarged lymph nodes: Secondary | ICD-10-CM

## 2012-10-05 DIAGNOSIS — R599 Enlarged lymph nodes, unspecified: Secondary | ICD-10-CM

## 2012-10-05 LAB — CBC WITH DIFFERENTIAL/PLATELET
Basophils Absolute: 0 10*3/uL (ref 0.0–0.1)
Basophils Relative: 0 % (ref 0–1)
Eosinophils Relative: 0 % (ref 0–5)
HCT: 33.5 % — ABNORMAL LOW (ref 36.0–46.0)
MCH: 33 pg (ref 26.0–34.0)
MCHC: 35.2 g/dL (ref 30.0–36.0)
MCV: 93.6 fL (ref 78.0–100.0)
Monocytes Absolute: 0.4 10*3/uL (ref 0.1–1.0)
RDW: 13.3 % (ref 11.5–15.5)

## 2012-10-05 LAB — COMPREHENSIVE METABOLIC PANEL
AST: 17 U/L (ref 0–37)
Albumin: 3.9 g/dL (ref 3.5–5.2)
CO2: 32 mEq/L (ref 19–32)
Calcium: 9.6 mg/dL (ref 8.4–10.5)
Creatinine, Ser: 0.81 mg/dL (ref 0.50–1.10)
GFR calc non Af Amer: 72 mL/min — ABNORMAL LOW (ref >90)

## 2012-10-05 LAB — SEDIMENTATION RATE: Sed Rate: 14 mm/hr (ref 0–22)

## 2012-10-05 NOTE — Progress Notes (Signed)
Labs drawn today for cbc/diff,cmp,ldh,sed rate 

## 2012-10-08 ENCOUNTER — Ambulatory Visit (HOSPITAL_COMMUNITY): Payer: Medicare Other | Admitting: Oncology

## 2012-10-13 ENCOUNTER — Encounter (HOSPITAL_COMMUNITY): Payer: Self-pay | Admitting: Oncology

## 2012-10-13 ENCOUNTER — Encounter (HOSPITAL_BASED_OUTPATIENT_CLINIC_OR_DEPARTMENT_OTHER): Payer: Medicare Other | Admitting: Oncology

## 2012-10-13 VITALS — BP 130/64 | HR 67 | Temp 97.7°F | Resp 16 | Wt 202.0 lb

## 2012-10-13 DIAGNOSIS — R590 Localized enlarged lymph nodes: Secondary | ICD-10-CM

## 2012-10-13 DIAGNOSIS — D649 Anemia, unspecified: Secondary | ICD-10-CM

## 2012-10-13 DIAGNOSIS — D696 Thrombocytopenia, unspecified: Secondary | ICD-10-CM

## 2012-10-13 DIAGNOSIS — R599 Enlarged lymph nodes, unspecified: Secondary | ICD-10-CM

## 2012-10-13 NOTE — Patient Instructions (Addendum)
[facility] Discharge Instructions  RECOMMENDATIONS MADE BY THE CONSULTANT AND ANY TEST RESULTS WILL BE SENT TO YOUR REFERRING PHYSICIAN.  EXAM FINDINGS BY THE PHYSICIAN TODAY AND SIGNS OR SYMPTOMS TO REPORT TO CLINIC OR PRIMARY PHYSICIAN: Exam and discussion by PA.  We will continue to watch you for now.  Will not treat unless you become symptomatic.  MEDICATIONS PRESCRIBED:  none  INSTRUCTIONS GIVEN AND DISCUSSED: Report fevers, recurring infections, night sweats, etc.  SPECIAL INSTRUCTIONS/FOLLOW-UP: Blood work in 12 weeks and to see MD in 12 weeks.  Thank you for choosing [facility] to provide your oncology and hematology care.  To afford each patient quality time with our providers, please arrive at least 15 minutes before your scheduled appointment time.  With your help, our goal is to use those 15 minutes to complete the necessary work-up to ensure our physicians have the information they need to help with your evaluation and healthcare recommendations.    Effective January 1st, 2014, we ask that you re-schedule your appointment with our physicians should you arrive 10 or more minutes late for your appointment.  We strive to give you quality time with our providers, and arriving late affects you and other patients whose appointments are after yours.    Again, thank you for choosing [facility].  Our hope is that these requests will decrease the amount of time that you wait before being seen by our physicians.       _____________________________________________________________  Should you have questions after your visit to [facility], please contact our office at [phone] between the hours of 8:30 a.m. and 5:00 p.m.  Voicemails left after 4:30 p.m. will not be returned until the following business day.  For prescription refill requests, have your pharmacy contact our office with your prescription refill request.

## 2012-10-13 NOTE — Progress Notes (Signed)
[name],[name], MD No address on file  Lymphadenopathy of left cervical region  CURRENT THERAPY: Observation  INTERVAL HISTORY: Jasmin Allen 72 y.o. female returns for  regular  visit for followup of possible lymphoma presenting with a mildly enlarged lymph node in the left supraclavicular fossa status post removal after was found initially in August of 2013 by her primary care physician. Date of removal was 06/14/2012.    I personally reviewed and went over laboratory results with the patient.  Hgb is stable but mildly anemic with Hgb of 11.8 g/dL and a minimally low platelet count at 147,000.  LDH is WNL and ESR is WNL.  Metabolic panel is unremarkable.   I personally reviewed and went over pathology results with the patient.  I personally reviewed and went over radiographic studies with the patient.  PET scan in Feb 2014 showed No convincing evidence of active lymphoma within the neck, chest, abdomen, or pelvis. PET scan shows an upper-limits-of-normal spleen and it is clearly generous when images are reviewed.  As a result of this picture, she possibly has a low grade lymphoma since her diagnosis has not yet clearly identified itself.  In Feb 2014 when she was seen by Dr. Mariel Allen, it was decided to observe the patient with repeat lab work and follow-up visit.    Jasmin Allen continues to deny any B symptoms including fevers, chills, night sweats, unintentional weight loss, early satiety, etc.  She was educated on signs and symptoms of B symptoms.    She was provided patient education regarding low-grade lymphoma. For now we will continue to observe and we will see her back with lab work in 12 weeks.   Hematologically, she denies any complaints and ROS questioning is negative.     Past Medical History  Diagnosis Date  . Hypertension   . Hyperlipemia   . Heart murmur 2012    echo  . Wears glasses   . Arthritis     has Lymphadenopathy of left cervical region on her problem list.      is allergic to penicillins and sulfa antibiotics.  Jasmin Allen does not currently have medications on file.  Past Surgical History  Procedure Laterality Date  . Carpal tunnel release  1985    rt hand  . Abdominal hysterectomy  1985  . Hammer toe surgery  10/01/2011    left toe  . Tonsillectomy    . Dilation and curettage of uterus      several  . Tubal ligation    . Lymph node biopsy  06/14/2012    Procedure: LYMPH NODE BIOPSY;  Surgeon: Jasmin Paris, MD;  Location: [facility];  Service: General;  Laterality: Left;  remove left neck lymph node    Denies any headaches, dizziness, double vision, fevers, chills, night sweats, nausea, vomiting, diarrhea, constipation, chest pain, heart palpitations, shortness of breath, blood in stool, black tarry stool, urinary pain, urinary burning, urinary frequency, hematuria.   PHYSICAL EXAMINATION  ECOG PERFORMANCE STATUS: 0 - Asymptomatic  Filed Vitals:   10/13/12 1415  BP: 130/64  Pulse: 67  Temp: 97.7 F (36.5 C)  Resp: 16    GENERAL:alert, no distress, well nourished, well developed, comfortable, cooperative, obese and smiling SKIN: skin color, texture, turgor are normal, no rashes or significant lesions HEAD: Normocephalic, No masses, lesions, tenderness or abnormalities EYES: normal, Conjunctiva are pink and non-injected EARS: External ears normal OROPHARYNX:mucous membranes are moist  NECK: supple, no adenopathy, thyroid normal size, non-tender, without nodularity,  no stridor, non-tender, trachea midline LYMPH:  no palpable lymphadenopathy BREAST:not examined LUNGS: clear to auscultation and percussion HEART: regular rate & rhythm, no gallops, S1 normal, S2 normal and systolic ejection murmur heard best on RSB 2nd ICS, but also noted on LSB 2nd ICS ABDOMEN:abdomen soft, non-tender, obese, normal bowel sounds and spleen tip palpated on deep inspiration BACK: Back symmetric, no curvature., No CVA  tenderness EXTREMITIES:less then 2 second capillary refill, no joint deformities, effusion, or inflammation, no edema, no skin discoloration, no clubbing, no cyanosis  NEURO: alert & oriented x 3 with fluent speech, no focal motor/sensory deficits, gait normal   LABORATORY DATA: CBC    Component Value Date/Time   WBC 4.2 10/05/2012 1032   RBC 3.58* 10/05/2012 1032   HGB 11.8* 10/05/2012 1032   HCT 33.5* 10/05/2012 1032   PLT 147* 10/05/2012 1032   MCV 93.6 10/05/2012 1032   MCH 33.0 10/05/2012 1032   MCHC 35.2 10/05/2012 1032   RDW 13.3 10/05/2012 1032   LYMPHSABS 1.5 10/05/2012 1032   MONOABS 0.4 10/05/2012 1032   EOSABS 0.0 10/05/2012 1032   BASOSABS 0.0 10/05/2012 1032      Chemistry      Component Value Date/Time   NA 139 10/05/2012 1032   K 4.0 10/05/2012 1032   CL 101 10/05/2012 1032   CO2 32 10/05/2012 1032   BUN 14 10/05/2012 1032   CREATININE 0.81 10/05/2012 1032      Component Value Date/Time   CALCIUM 9.6 10/05/2012 1032   ALKPHOS 105 10/05/2012 1032   AST 17 10/05/2012 1032   ALT 17 10/05/2012 1032   BILITOT 0.6 10/05/2012 1032     Lab Results  Component Value Date   ESRSEDRATE 14 10/05/2012   Results for Jasmin Allen, Jasmin Allen (MRN [id number]) as of 10/13/2012 14:17  Ref. Range 10/05/2012 10:32  LDH Latest Range: 94-250 U/L 220     RADIOGRAPHIC STUDIES:  07/20/2012  *RADIOLOGY REPORT*  Clinical Data: Initial treatment strategy for staging of lymphoma.  Lymphadenopathy of left cervical region. History of splenomegaly.  Biopsy of left supraclavicular lymph node suspicious but not  diagnostic of lymphoma.  NUCLEAR MEDICINE PET SKULL BASE TO THIGH  Fasting Blood Glucose: 103  Technique: 19.2 mCi F-18 FDG was injected intravenously. CT data  was obtained and used for attenuation correction and anatomic  localization only. (This was not acquired as a diagnostic CT  examination.) Additional exam technical data entered on  technologist worksheet.  Comparison: None.  Findings:  Neck: Right  submandibular activity which is primarily felt to be  physiologic and related to the submandibular gland. There is a 9  mm node anterior to the submandibular gland which measures a S.U.V.  max of 2.8 on image 39/series 2.  Chest: Diffuse hypermetabolism involves the left trapezius  musculature, without CT correlate. No hypermetabolic nodes within  the chest.  Abdomen/Pelvis: No hypermetabolic nodes within the abdomen or  pelvis. There is bilateral greater trochanteric bursitis.  Skeleton: No abnormal osseous activity to suggest marrow  involvement.  CT images performed for attenuation correction demonstrate no  further findings within the neck. Aortic valvular calcifications  versus prior valvuloplasty. Small hiatal hernia. No evidence of  splenomegaly. Spleen measures less than 10 cm. Hysterectomy.  IMPRESSION:  1. No convincing evidence of active lymphoma within the neck,  chest, abdomen, or pelvis.  2. A small right submandibular node demonstrates mild low level  hypermetabolism. Favored to be physiologic. Consider physical  exam surveillance.  3. Aortic  valvular calcification versus prior valve repair. If  there has not been prior surgery to the aortic valve,  echocardiography should be considered.  4. Hypermetabolism about the left trapezius musculature could be  post-traumatic or due to movement after radiopharmaceutical  injection.  Original Report Authenticated By: Jeronimo Greaves, M.D.        PATHOLOGY: 06/04/2012  Interpretation Tissue-Flow Cytometry - NO MONOCLONAL B-CELL POPULATION OR ABNORMAL B-CELL PHENOTYPE IDENTIFIED. - T-CELLS WITH A PREDOMINANT HELPER PHENOTYPE (NONSPECIFIC) Guerry Bruin MD Pathologist, Electronic Signature (Case signed 06/17/2012)   06/04/2012  ADDITIONAL INFORMATION: PCR for immunoglobulin heavy chain gene and BCL-2 gene were performed on paraffin embedded tissue at the [facility] in [place], [place]. Both results  are negative. Despite the negative results, close clinical follow-up and excisional biopsy is recommended if adenopathy is persistent or recurrent. 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 Microscopic Comment The sections show partial effacement of the lymph node architecture by an atypical lymphoid proliferation characterized by predominance of small to medium sized lymphoid cells with round to slightly irregular nuclei, dense chromatin, and small to inconspicuous nucleoli. The atypical lymphoid cells show scanty to moderately abundant cytoplasm which often appears clear giving a monocytoid appearance to many of the atypical cells. The atypical lymphoid cells form nodules and intersecting bands as well as small diffuse areas which occasionally surround variably sized but reactive appearing germinal centers. Obvious proliferation centers or atypical follicular structures are not seen. No necrosis or increased mitosis is seen. In this background, scattered small foci of paracortical, reactive appearing tissue with scattered histiocytes are also seen. Flow cytometric analysis was performed but failed to show any monoclonal B cell population or abnormal B cell phenotype. Immunohistochemical stains for CD20, CD21, CD79a, CD3, CD43, CD5, CD10, Cyclin D1, BCL2, and BCL6 were performed with appropriate controls. The atypical lymphoid cells in question stain positively for B cell markers CD79a and CD20 in addition to staining for BCL2 protein. The cells in question are negative for Cyclin D1, CD5, CD21, CD10, and BCL6. CD21 in particular highlights many of the follicular dendritic networks throughout the lymph node. CD3, CD43 and CD5 highlights the admixed T cell component 1 of 2 FINAL for [facility],  Rondi J [phone]) Microscopic Comment(continued) present in the background. BCL-2 is negative in scattered germinal centers present. The morphologic and immunoperoxidase findings are highly suspicious for partial involvement by low grade non-Hodgkin's B cell lymphoma, particularly marginal zone lymphoma. However I am unable to confirm this suspicion by flow cytometric analysis which essentially shows a polyclonal B cell population. The latter discrepancy may be due to sampling. Gene rearrangement study on paraffin embedded tissue will be performed and the results reported in an addendum. (BNS:kh 06-17-12) Guerry Bruin MD Pathologist, Electronic Signature (Case signed 06/17/2012)     ASSESSMENT:  1. Possible lymphoma presenting with a mildly enlarged lymph node in the left supraclavicular fossa status post removal after was found initially in August of 2013 by her primary care physician. Date of removal was 06/14/2012.   2. Mild normocytic, normochromic anemia 3. Minimal thrombocytopenia  Patient Active Problem List   Diagnosis Date Noted  . Lymphadenopathy of left cervical region 04/07/2012      PLAN:  1. I personally reviewed and went over laboratory results with the patient. 2. I personally reviewed and went over radiographic studies with the patient.  3. I personally reviewed and went over pathology results with the patient. 4. Patient education regarding low-grade lymphoma 5. Labs in 12 weeks: CBC diff, CMET, LDH, ESR, B2 Microglobulin level 6. Patient educated on B symptoms. Patient will contact the clinic with any clinical changes 7. Return in 12 weeks for follow-up.   All questions were answered. The patient knows to call the clinic with any problems, questions or concerns. We can certainly see the patient much sooner if necessary.  The patient and plan discussed with Glenford Peers, MD and he is in agreement with the aforementioned.  KEFALAS,THOMAS

## 2012-12-07 ENCOUNTER — Encounter (INDEPENDENT_AMBULATORY_CARE_PROVIDER_SITE_OTHER): Payer: Self-pay | Admitting: General Surgery

## 2012-12-07 ENCOUNTER — Ambulatory Visit (INDEPENDENT_AMBULATORY_CARE_PROVIDER_SITE_OTHER): Payer: Medicare Other | Admitting: General Surgery

## 2012-12-07 VITALS — BP 124/72 | HR 65 | Temp 97.4°F | Resp 18 | Ht 65.0 in | Wt 201.2 lb

## 2012-12-07 DIAGNOSIS — K8050 Calculus of bile duct without cholangitis or cholecystitis without obstruction: Secondary | ICD-10-CM

## 2012-12-07 DIAGNOSIS — K8021 Calculus of gallbladder without cholecystitis with obstruction: Secondary | ICD-10-CM

## 2012-12-07 DIAGNOSIS — K8020 Calculus of gallbladder without cholecystitis without obstruction: Secondary | ICD-10-CM

## 2012-12-07 NOTE — Progress Notes (Signed)
Patient ID: Jasmin Allen, female   DOB: 1940-09-26, 72 y.o.   MRN: [id number]  No chief complaint on file.   HPI Jasmin Allen is a 72 y.o. female.  The patient is a 72 year old female who was referred by Dr. Burna Mortimer for evaluation of gallbladder sludge. Patient was previously worked up and is currently undergoing continued observation for possible lymphoma.  The patient states that approximately 1 in 2 months ago she began having abdominal pain included a right upper quadrant. She states this was associated with nausea and vomiting. Since that time she's continued with some localized right upper quadrant pain. The chart for her to pinpoint certain food type because his pain.   HPI  Past Medical History  Diagnosis Date  . Hypertension   . Hyperlipemia   . Heart murmur 2012    echo  . Wears glasses   . Arthritis     Past Surgical History  Procedure Laterality Date  . Carpal tunnel release  1985    rt hand  . Abdominal hysterectomy  1985  . Hammer toe surgery  10/01/2011    left toe  . Tonsillectomy    . Dilation and curettage of uterus      several  . Tubal ligation    . Lymph node biopsy  06/14/2012    Procedure: LYMPH NODE BIOPSY;  Surgeon: Currie Paris, MD;  Location: [facility];  Service: General;  Laterality: Left;  remove left neck lymph node    Family History  Problem Relation Age of Onset  . Hypertension Mother   . COPD Father   . Stroke Maternal Grandfather     Social History History  Substance Use Topics  . Smoking status: Never Smoker   . Smokeless tobacco: Never Used  . Alcohol Use: Yes     Comment: occ    Allergies  Allergen Reactions  . Penicillins   . Sulfa Antibiotics     Current Outpatient Prescriptions  Medication Sig Dispense Refill  . aspirin 81 MG tablet Take 81 mg by mouth daily.      Marland Kitchen b complex vitamins tablet Take 1 tablet by mouth daily.      . carvedilol (COREG) 6.25 MG tablet 6.25 mg 2 (two) times daily  with a meal.       . folic acid (FOLVITE) 400 MCG tablet Take 400 mcg by mouth daily.      . hydrochlorothiazide (HYDRODIURIL) 25 MG tablet 25 mg every morning.       Marland Kitchen losartan (COZAAR) 100 MG tablet 100 mg daily.       . pravastatin (PRAVACHOL) 20 MG tablet 20 mg daily.        No current facility-administered medications for this visit.    Review of Systems Review of Systems  Constitutional: Negative.   HENT: Negative.   Eyes: Negative.   Respiratory: Negative.   Cardiovascular: Negative.   Gastrointestinal: Positive for nausea and abdominal pain.  Musculoskeletal: Negative.   Neurological: Negative.   Hematological: Negative.   All other systems reviewed and are negative.    There were no vitals taken for this visit.  Physical Exam Physical Exam  Constitutional: She is oriented to person, place, and time. She appears well-developed and well-nourished.  HENT:  Head: Normocephalic and atraumatic.  Eyes: Conjunctivae and EOM are normal. Pupils are equal, round, and reactive to light.  Neck: Normal range of motion. Neck supple.  Cardiovascular: Normal rate, regular rhythm and normal heart sounds.  Pulmonary/Chest: Effort normal and breath sounds normal.  Abdominal: Soft. Bowel sounds are normal. There is tenderness (RUq).  Musculoskeletal: Normal range of motion.  Neurological: She is alert and oriented to person, place, and time.    Data Reviewed Ultrasound of gallbladder  Reveals gallbladder sludge without gallbladder wall not thickened. T. Bili elevated at 1.14 alkaline phosphatase of 98 LFTs within normal limits.  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 We'll proceed to the operating room for a laparoscopic cholecystectomy with IOC. 2. We will obtain Surgical clearance from Dr. Burna Mortimer prior to scheduling. 3. All risks and benefits were discussed with the patient to generally include: infection, bleeding, possible need for  post op ERCP, damage to the bile ducts, and bile leak. Alternatives were offered and described.  All questions were answered and the patient voiced understanding of the procedure and wishes to proceed at this point with a laparoscopic cholecystectomy         Marigene Ehlers., Jed Limerick 12/07/2012, 2:01 PM

## 2012-12-08 ENCOUNTER — Encounter (INDEPENDENT_AMBULATORY_CARE_PROVIDER_SITE_OTHER): Payer: Self-pay

## 2012-12-13 ENCOUNTER — Encounter (INDEPENDENT_AMBULATORY_CARE_PROVIDER_SITE_OTHER): Payer: Self-pay

## 2012-12-21 ENCOUNTER — Encounter (HOSPITAL_COMMUNITY): Payer: Self-pay | Admitting: Pharmacy Technician

## 2012-12-27 ENCOUNTER — Encounter (HOSPITAL_COMMUNITY): Payer: Self-pay

## 2012-12-27 ENCOUNTER — Ambulatory Visit (HOSPITAL_COMMUNITY)
Admission: RE | Admit: 2012-12-27 | Discharge: 2012-12-27 | Disposition: A | Discharge status: Home / Self Care | Payer: Medicare Other | Source: Ambulatory Visit | Attending: General Surgery | Admitting: General Surgery

## 2012-12-27 ENCOUNTER — Encounter (HOSPITAL_COMMUNITY)
Admission: RE | Admit: 2012-12-27 | Discharge: 2012-12-27 | Disposition: A | Discharge status: Home / Self Care | Payer: Medicare Other | Source: Ambulatory Visit | Attending: General Surgery | Admitting: General Surgery

## 2012-12-27 DIAGNOSIS — I10 Essential (primary) hypertension: Secondary | ICD-10-CM | POA: Insufficient documentation

## 2012-12-27 DIAGNOSIS — Z01810 Encounter for preprocedural cardiovascular examination: Secondary | ICD-10-CM | POA: Insufficient documentation

## 2012-12-27 DIAGNOSIS — Z01818 Encounter for other preprocedural examination: Secondary | ICD-10-CM | POA: Insufficient documentation

## 2012-12-27 DIAGNOSIS — Z01812 Encounter for preprocedural laboratory examination: Secondary | ICD-10-CM | POA: Insufficient documentation

## 2012-12-27 HISTORY — DX: Nausea with vomiting, unspecified: R11.2

## 2012-12-27 HISTORY — DX: Other specified postprocedural states: Z98.890

## 2012-12-27 LAB — BASIC METABOLIC PANEL
CO2: 30 mEq/L (ref 19–32)
Calcium: 9.9 mg/dL (ref 8.4–10.5)
Creatinine, Ser: 0.84 mg/dL (ref 0.50–1.10)
Glucose, Bld: 108 mg/dL — ABNORMAL HIGH (ref 70–99)
Sodium: 140 mEq/L (ref 135–145)

## 2012-12-27 LAB — CBC
HCT: 35.2 % — ABNORMAL LOW (ref 36.0–46.0)
MCH: 32.9 pg (ref 26.0–34.0)
MCV: 94.9 fL (ref 78.0–100.0)
Platelets: 150 10*3/uL (ref 150–400)
RBC: 3.71 MIL/uL — ABNORMAL LOW (ref 3.87–5.11)

## 2012-12-27 NOTE — Pre-Procedure Instructions (Signed)
Jasmin Allen  12/27/2012   Your procedure is scheduled on:  01/03/13  Report to [facility] at 730 AM.  Call this number if you have problems the morning of surgery: [phone]   Remember:   Do not eat food or drink liquids after midnight.   Take these medicines the morning of surgery with A SIP OF WATER: carvedilol   Do not wear jewelry, make-up or nail polish.  Do not wear lotions, powders, or perfumes. You may wear deodorant.  Do not shave 48 hours prior to surgery. Men may shave face and neck.  Do not bring valuables to the hospital.  [facility] is not responsible                   for any belongings or valuables.  Contacts, dentures or bridgework may not be worn into surgery.  Leave suitcase in the car. After surgery it may be brought to your room.  For patients admitted to the hospital, checkout time is 11:00 AM the day of  discharge.   Patients discharged the day of surgery will not be allowed to drive  home.  Name and phone number of your driver: family  Special Instructions: Shower using CHG 2 nights before surgery and the night before surgery.  If you shower the day of surgery use CHG.  Use special wash - you have one bottle of CHG for all showers.  You should use approximately 1/3 of the bottle for each shower.   Please read over the following fact sheets that you were given: Pain Booklet, Coughing and Deep Breathing and Surgical Site Infection Prevention

## 2013-01-02 MED ORDER — CIPROFLOXACIN IN D5W 400 MG/200ML IV SOLN
400.0000 mg | INTRAVENOUS | Status: AC
  Administered 2013-01-03: 400 mg via INTRAVENOUS
  Filled 2013-01-02: qty 200

## 2013-01-02 MED ORDER — CHLORHEXIDINE GLUCONATE 4 % EX LIQD: 1.0000 "application " | Freq: Once | CUTANEOUS | Status: DC

## 2013-01-03 ENCOUNTER — Encounter (HOSPITAL_COMMUNITY): Payer: Self-pay | Admitting: *Deleted

## 2013-01-03 ENCOUNTER — Encounter (HOSPITAL_COMMUNITY): Payer: Self-pay | Admitting: Certified Registered Nurse Anesthetist

## 2013-01-03 ENCOUNTER — Ambulatory Visit (HOSPITAL_COMMUNITY)
Admission: RE | Admit: 2013-01-03 | Discharge: 2013-01-03 | Disposition: A | Discharge status: Home / Self Care | Payer: Medicare Other | Source: Ambulatory Visit | Attending: General Surgery | Admitting: General Surgery

## 2013-01-03 ENCOUNTER — Ambulatory Visit (HOSPITAL_COMMUNITY): Payer: Medicare Other | Admitting: Certified Registered Nurse Anesthetist

## 2013-01-03 ENCOUNTER — Encounter (HOSPITAL_COMMUNITY)
Admission: RE | Disposition: A | Discharge status: Home / Self Care | Payer: Self-pay | Source: Ambulatory Visit | Attending: General Surgery

## 2013-01-03 DIAGNOSIS — Z79899 Other long term (current) drug therapy: Secondary | ICD-10-CM | POA: Insufficient documentation

## 2013-01-03 DIAGNOSIS — Z823 Family history of stroke: Secondary | ICD-10-CM | POA: Insufficient documentation

## 2013-01-03 DIAGNOSIS — K8066 Calculus of gallbladder and bile duct with acute and chronic cholecystitis without obstruction: Secondary | ICD-10-CM

## 2013-01-03 DIAGNOSIS — I10 Essential (primary) hypertension: Secondary | ICD-10-CM | POA: Insufficient documentation

## 2013-01-03 DIAGNOSIS — Z882 Allergy status to sulfonamides status: Secondary | ICD-10-CM | POA: Insufficient documentation

## 2013-01-03 DIAGNOSIS — E785 Hyperlipidemia, unspecified: Secondary | ICD-10-CM | POA: Insufficient documentation

## 2013-01-03 DIAGNOSIS — R011 Cardiac murmur, unspecified: Secondary | ICD-10-CM | POA: Insufficient documentation

## 2013-01-03 DIAGNOSIS — M129 Arthropathy, unspecified: Secondary | ICD-10-CM | POA: Insufficient documentation

## 2013-01-03 DIAGNOSIS — Z880 Allergy status to penicillin: Secondary | ICD-10-CM | POA: Insufficient documentation

## 2013-01-03 DIAGNOSIS — Z7982 Long term (current) use of aspirin: Secondary | ICD-10-CM | POA: Insufficient documentation

## 2013-01-03 DIAGNOSIS — K8000 Calculus of gallbladder with acute cholecystitis without obstruction: Secondary | ICD-10-CM | POA: Insufficient documentation

## 2013-01-03 DIAGNOSIS — Z90710 Acquired absence of both cervix and uterus: Secondary | ICD-10-CM | POA: Insufficient documentation

## 2013-01-03 DIAGNOSIS — K8010 Calculus of gallbladder with chronic cholecystitis without obstruction: Secondary | ICD-10-CM | POA: Insufficient documentation

## 2013-01-03 HISTORY — PX: CHOLECYSTECTOMY: SHX55

## 2013-01-03 SURGERY — LAPAROSCOPIC CHOLECYSTECTOMY
Wound class: Clean Contaminated

## 2013-01-03 MED ORDER — LIDOCAINE HCL (CARDIAC) 20 MG/ML IV SOLN
INTRAVENOUS | Status: DC | PRN
  Administered 2013-01-03: 80 mg via INTRAVENOUS

## 2013-01-03 MED ORDER — PROPOFOL 10 MG/ML IV BOLUS
INTRAVENOUS | Status: DC | PRN
  Administered 2013-01-03: 150 mg via INTRAVENOUS

## 2013-01-03 MED ORDER — SODIUM CHLORIDE 0.9 % IV SOLN: 250.0000 mL | INTRAVENOUS | Status: DC | PRN

## 2013-01-03 MED ORDER — FENTANYL CITRATE 0.05 MG/ML IJ SOLN
INTRAMUSCULAR | Status: DC | PRN
  Administered 2013-01-03 (×4): 50 ug via INTRAVENOUS

## 2013-01-03 MED ORDER — ONDANSETRON HCL 4 MG/2ML IJ SOLN
INTRAMUSCULAR | Status: DC | PRN
  Administered 2013-01-03: 4 mg via INTRAVENOUS

## 2013-01-03 MED ORDER — SODIUM CHLORIDE 0.9 % IR SOLN
Status: DC | PRN
  Administered 2013-01-03: 1000 mL

## 2013-01-03 MED ORDER — OXYCODONE HCL 5 MG/5ML PO SOLN: 5.0000 mg | Freq: Once | ORAL | Status: DC | PRN

## 2013-01-03 MED ORDER — OXYCODONE HCL 5 MG PO TABS: 5.0000 mg | ORAL_TABLET | Freq: Once | ORAL | Status: DC | PRN

## 2013-01-03 MED ORDER — ROCURONIUM BROMIDE 100 MG/10ML IV SOLN
INTRAVENOUS | Status: DC | PRN
  Administered 2013-01-03: 50 mg via INTRAVENOUS

## 2013-01-03 MED ORDER — BUPIVACAINE HCL 0.25 % IJ SOLN
INTRAMUSCULAR | Status: DC | PRN
  Administered 2013-01-03: 7 mL

## 2013-01-03 MED ORDER — ONDANSETRON HCL 4 MG/2ML IJ SOLN
INTRAMUSCULAR | Status: AC
  Administered 2013-01-03: 4 mg via INTRAVENOUS
  Filled 2013-01-03: qty 2

## 2013-01-03 MED ORDER — MIDAZOLAM HCL 5 MG/5ML IJ SOLN
INTRAMUSCULAR | Status: DC | PRN
  Administered 2013-01-03: 1 mg via INTRAVENOUS

## 2013-01-03 MED ORDER — ARTIFICIAL TEARS OP OINT
TOPICAL_OINTMENT | OPHTHALMIC | Status: DC | PRN
  Administered 2013-01-03: 1 via OPHTHALMIC

## 2013-01-03 MED ORDER — ACETAMINOPHEN 325 MG PO TABS: 650.0000 mg | ORAL_TABLET | ORAL | Status: DC | PRN

## 2013-01-03 MED ORDER — PHENYLEPHRINE HCL 10 MG/ML IJ SOLN
20.0000 mg | INTRAVENOUS | Status: DC | PRN
  Administered 2013-01-03: 25 ug/min via INTRAVENOUS

## 2013-01-03 MED ORDER — OXYCODONE-ACETAMINOPHEN 5-325 MG PO TABS: 1.0000 | ORAL_TABLET | ORAL | Status: DC | PRN

## 2013-01-03 MED ORDER — HYDROMORPHONE HCL PF 1 MG/ML IJ SOLN
INTRAMUSCULAR | Status: AC
  Administered 2013-01-03: 0.5 mg via INTRAVENOUS
  Filled 2013-01-03: qty 1

## 2013-01-03 MED ORDER — PHENYLEPHRINE HCL 10 MG/ML IJ SOLN
INTRAMUSCULAR | Status: DC | PRN
  Administered 2013-01-03 (×2): 40 ug via INTRAVENOUS

## 2013-01-03 MED ORDER — PROMETHAZINE HCL 25 MG/ML IJ SOLN
INTRAMUSCULAR | Status: AC
  Filled 2013-01-03: qty 1

## 2013-01-03 MED ORDER — 0.9 % SODIUM CHLORIDE (POUR BTL) OPTIME
TOPICAL | Status: DC | PRN
  Administered 2013-01-03: 1000 mL

## 2013-01-03 MED ORDER — HYDROMORPHONE HCL PF 1 MG/ML IJ SOLN: 0.2500 mg | INTRAMUSCULAR | Status: DC | PRN

## 2013-01-03 MED ORDER — GLYCOPYRROLATE 0.2 MG/ML IJ SOLN
INTRAMUSCULAR | Status: DC | PRN
  Administered 2013-01-03: .8 mg via INTRAVENOUS

## 2013-01-03 MED ORDER — NEOSTIGMINE METHYLSULFATE 1 MG/ML IJ SOLN
INTRAMUSCULAR | Status: DC | PRN
  Administered 2013-01-03: 5 mg via INTRAVENOUS

## 2013-01-03 MED ORDER — ACETAMINOPHEN 650 MG RE SUPP: 650.0000 mg | RECTAL | Status: DC | PRN

## 2013-01-03 MED ORDER — LACTATED RINGERS IV SOLN
INTRAVENOUS | Status: DC
  Administered 2013-01-03: 08:00:00 via INTRAVENOUS

## 2013-01-03 MED ORDER — BUPIVACAINE HCL (PF) 0.25 % IJ SOLN
INTRAMUSCULAR | Status: AC
  Filled 2013-01-03: qty 30

## 2013-01-03 MED ORDER — ONDANSETRON HCL 4 MG/2ML IJ SOLN: 4.0000 mg | Freq: Four times a day (QID) | INTRAMUSCULAR | Status: DC | PRN

## 2013-01-03 MED ORDER — PROMETHAZINE HCL 25 MG/ML IJ SOLN: 6.2500 mg | INTRAMUSCULAR | Status: DC | PRN

## 2013-01-03 MED ORDER — OXYCODONE HCL 5 MG PO TABS: 5.0000 mg | ORAL_TABLET | ORAL | Status: DC | PRN

## 2013-01-03 SURGICAL SUPPLY — 39 items
APPLIER CLIP 5 13 M/L LIGAMAX5 (MISCELLANEOUS) ×3
BENZOIN TINCTURE PRP APPL 2/3 (GAUZE/BANDAGES/DRESSINGS) ×3 IMPLANT
CANISTER SUCTION 2500CC (MISCELLANEOUS) ×3 IMPLANT
CHLORAPREP W/TINT 26ML (MISCELLANEOUS) ×3 IMPLANT
CLIP APPLIE 5 13 M/L LIGAMAX5 (MISCELLANEOUS) ×2 IMPLANT
CLOTH BEACON ORANGE TIMEOUT ST (SAFETY) ×3 IMPLANT
COVER MAYO STAND STRL (DRAPES) ×3 IMPLANT
COVER SURGICAL LIGHT HANDLE (MISCELLANEOUS) ×3 IMPLANT
COVER TRANSDUCER ULTRASND (DRAPES) ×6 IMPLANT
DEVICE TROCAR PUNCTURE CLOSURE (ENDOMECHANICALS) ×3 IMPLANT
DRAPE C-ARM 42X72 X-RAY (DRAPES) ×3 IMPLANT
DRAPE UTILITY 15X26 W/TAPE STR (DRAPE) ×6 IMPLANT
ELECT REM PT RETURN 9FT ADLT (ELECTROSURGICAL) ×3
ELECTRODE REM PT RTRN 9FT ADLT (ELECTROSURGICAL) ×2 IMPLANT
GAUZE SPONGE 2X2 8PLY STRL LF (GAUZE/BANDAGES/DRESSINGS) ×2 IMPLANT
GLOVE BIO SURGEON STRL SZ7.5 (GLOVE) ×3 IMPLANT
GOWN STRL NON-REIN LRG LVL3 (GOWN DISPOSABLE) ×9 IMPLANT
GOWN STRL REIN XL XLG (GOWN DISPOSABLE) ×3 IMPLANT
IV CATH 14GX2 1/4 (CATHETERS) ×3 IMPLANT
KIT BASIN OR (CUSTOM PROCEDURE TRAY) ×3 IMPLANT
KIT ROOM TURNOVER OR (KITS) ×3 IMPLANT
NEEDLE INSUFFLATION 14GA 120MM (NEEDLE) ×6 IMPLANT
NS IRRIG 1000ML POUR BTL (IV SOLUTION) ×3 IMPLANT
PAD ARMBOARD 7.5X6 YLW CONV (MISCELLANEOUS) ×6 IMPLANT
POUCH SPECIMEN RETRIEVAL 10MM (ENDOMECHANICALS) IMPLANT
SCISSORS LAP 5X35 DISP (ENDOMECHANICALS) ×3 IMPLANT
SET CHOLANGIOGRAPHY FRANKLIN (SET/KITS/TRAYS/PACK) ×3 IMPLANT
SET IRRIG TUBING LAPAROSCOPIC (IRRIGATION / IRRIGATOR) ×3 IMPLANT
SLEEVE ENDOPATH XCEL 5M (ENDOMECHANICALS) ×3 IMPLANT
SPECIMEN JAR SMALL (MISCELLANEOUS) ×3 IMPLANT
SPONGE GAUZE 2X2 STER 10/PKG (GAUZE/BANDAGES/DRESSINGS) ×1
STRIP CLOSURE SKIN 1/2X4 (GAUZE/BANDAGES/DRESSINGS) ×3 IMPLANT
SUT MNCRL AB 3-0 PS2 18 (SUTURE) ×3 IMPLANT
TAPE CLOTH SURG 4X10 WHT LF (GAUZE/BANDAGES/DRESSINGS) ×3 IMPLANT
TOWEL OR 17X24 6PK STRL BLUE (TOWEL DISPOSABLE) ×3 IMPLANT
TOWEL OR 17X26 10 PK STRL BLUE (TOWEL DISPOSABLE) ×3 IMPLANT
TRAY LAPAROSCOPIC (CUSTOM PROCEDURE TRAY) ×3 IMPLANT
TROCAR XCEL NON-BLD 11X100MML (ENDOMECHANICALS) ×3 IMPLANT
TROCAR XCEL NON-BLD 5MMX100MML (ENDOMECHANICALS) ×3 IMPLANT

## 2013-01-03 NOTE — Op Note (Addendum)
Pre Operative Diagnosis:  Symptomatic gallstones  Post Operative Diagnosis: same  Procedure: Lap chole   Surgeon: Dr. Axel Filler  Assistant: none  Anesthesia: GETA  EBL: <5cc  Complications: none  Counts: reported as correct x 2  Findings:  Chronically inflammed gallbladder  Indications for procedure:  Pt is a 72 year old Female with symptomatic gallstones.  Pt was seen in clinic and decided to have her gallbladder electively removed.  Details of the procedure: The patient was taken to the operating and placed in the supine position with bilateral SCDs in place. A time out was called and all facts were verified. A pneumoperitoneum was obtained via A Veress needle technique to a pressure of 14mm of mercury. A 5mm trochar was then placed in the right upper quadrant under visualization, and there were no injuries to any abdominal organs. A 11 mm port was then placed in the umbilical region after infiltrating with local anesthesia under direct visualization. A second epigastric port was placed under direct visualization. The gallbladder was identified and retracted, the peritoneum was then sharply dissected from the gallbladder and this dissection was carried down to Calot's triangle. The cystic duct was identified and stripped away circumferentially and seen going into the gallbladder 360.   2 clips were placed proximally one distally and the cystic duct transected. The cystic artery was identified and 2 clips placed proximally and one distally and transected.  We then proceeded to remove the gallbladder off the hepatic fossa with Bovie cautery. A latex retrieval bag was then placed in the abdomen and gallbladder placed in the bag. The hepatic fossa was then reexamined and hemostasis was achieved with laparoscopic specula and Bovie cautery and was excellent at this portion of the case. The subhepatic fossa and perihepatic fossa was then irrigated until the effluent was clear. The 11 mm  trocar fascia was reapproximated with the Endo Close #1 Vicryl x2.  The pneumoperitoneum was evacuated and all trochars removed under direct visulalization.  The skin was then closed with 4-0 Monocryl and the skin dressed with Steri-Strips, gauze, and tape.  The patient was awaken from general anesthesia and taken to the recovery room in stable condition.

## 2013-01-03 NOTE — Anesthesia Procedure Notes (Addendum)
Procedure Name: Intubation Date/Time: 01/03/2013 9:51 AM Performed by: Margaree Mackintosh Pre-anesthesia Checklist: Patient identified, Emergency Drugs available, Patient being monitored, Suction available and Timeout performed Patient Re-evaluated:Patient Re-evaluated prior to inductionOxygen Delivery Method: Circle system utilized Preoxygenation: Pre-oxygenation with 100% oxygen Intubation Type: IV induction Ventilation: Mask ventilation without difficulty Laryngoscope Size: Mac and 3 Grade View: Grade I Tube type: Oral Tube size: 7.0 mm Number of attempts: 1 Airway Equipment and Method: Stylet Placement Confirmation: ETT inserted through vocal cords under direct vision,  positive ETCO2,  CO2 detector and breath sounds checked- equal and bilateral Secured at: 23 cm Tube secured with: Tape Dental Injury: Teeth and Oropharynx as per pre-operative assessment

## 2013-01-03 NOTE — Anesthesia Postprocedure Evaluation (Signed)
  Anesthesia Post-op Note  Patient: Jasmin Allen  Procedure(s) Performed: Procedure(s): LAPAROSCOPIC CHOLECYSTECTOMY attempted cholangiogram  Patient Location: PACU  Anesthesia Type:General  Level of Consciousness: awake and alert   Airway and Oxygen Therapy: Patient Spontanous Breathing  Post-op Pain: mild  Post-op Assessment: Post-op Vital signs reviewed and Patient's Cardiovascular Status Stable  Post-op Vital Signs: stable  Complications: No apparent anesthesia complications

## 2013-01-03 NOTE — Anesthesia Preprocedure Evaluation (Addendum)
Anesthesia Evaluation  Patient identified by MRN, date of birth, ID band Patient awake    Reviewed: Allergy & Precautions, H&P , NPO status , Patient's Chart, lab work & pertinent test results  History of Anesthesia Complications (+) PONV  Airway Mallampati: II  Neck ROM: Full    Dental  (+) Teeth Intact   Pulmonary neg pulmonary ROS,  breath sounds clear to auscultation        Cardiovascular hypertension, + Valvular Problems/Murmurs AS Rhythm:Regular Rate:Normal + Systolic murmurs Hx of ECHO reveals moderate AS and good LV function   Neuro/Psych    GI/Hepatic negative GI ROS, Neg liver ROS,   Endo/Other    Renal/GU      Musculoskeletal  (+) Arthritis -,   Abdominal (+) + obese,   Peds  Hematology  (+) Blood dyscrasia, ,   Anesthesia Other Findings   Reproductive/Obstetrics                        Anesthesia Physical Anesthesia Plan  ASA: II  Anesthesia Plan: General   Post-op Pain Management:    Induction: Intravenous  Airway Management Planned: Oral ETT  Additional Equipment: Arterial line  Intra-op Plan:   Post-operative Plan: Extubation in OR  Informed Consent: I have reviewed the patients History and Physical, chart, labs and discussed the procedure including the risks, benefits and alternatives for the proposed anesthesia with the patient or authorized representative who has indicated his/her understanding and acceptance.   Dental advisory given  Plan Discussed with: CRNA and Surgeon  Anesthesia Plan Comments:        Anesthesia Quick Evaluation

## 2013-01-03 NOTE — Interval H&P Note (Signed)
History and Physical Interval Note:  01/03/2013 7:06 AM  Jasmin Allen  has presented today for surgery, with the diagnosis of gallstones  The various methods of treatment have been discussed with the patient and family. After consideration of risks, benefits and other options for treatment, the patient has consented to  Procedure(s): LAPAROSCOPIC CHOLECYSTECTOMY WITH INTRAOPERATIVE CHOLANGIOGRAM (N/A) as a surgical intervention .  The patient's history has been reviewed, patient examined, no change in status, stable for surgery.  I have reviewed the patient's chart and labs.  Questions were answered to the patient's satisfaction.     Marigene Ehlers., Jed Limerick

## 2013-01-03 NOTE — H&P (View-Only) (Signed)
Patient ID: Jacquita J Morici, female   DOB: 06/30/1940, 72 y.o.   MRN: [id number]  No chief complaint on file.   HPI Lanijah J Cowger is a 72 y.o. female.  The patient is a 72-year-old female who was referred by Dr. [name] for evaluation of gallbladder sludge. Patient was previously worked up and is currently undergoing continued observation for possible lymphoma.  The patient states that approximately 1 in 2 months ago she began having abdominal pain included a right upper quadrant. She states this was associated with nausea and vomiting. Since that time she's continued with some localized right upper quadrant pain. The chart for her to pinpoint certain food type because his pain.   HPI  Past Medical History  Diagnosis Date  . Hypertension   . Hyperlipemia   . Heart murmur 2012    echo  . Wears glasses   . Arthritis     Past Surgical History  Procedure Laterality Date  . Carpal tunnel release  1985    rt hand  . Abdominal hysterectomy  1985  . Hammer toe surgery  10/01/2011    left toe  . Tonsillectomy    . Dilation and curettage of uterus      several  . Tubal ligation    . Lymph node biopsy  06/14/2012    Procedure: LYMPH NODE BIOPSY;  Surgeon: Christian J Streck, MD;  Location: [facility];  Service: General;  Laterality: Left;  remove left neck lymph node    Family History  Problem Relation Age of Onset  . Hypertension Mother   . COPD Father   . Stroke Maternal Grandfather     Social History History  Substance Use Topics  . Smoking status: Never Smoker   . Smokeless tobacco: Never Used  . Alcohol Use: Yes     Comment: occ    Allergies  Allergen Reactions  . Penicillins   . Sulfa Antibiotics     Current Outpatient Prescriptions  Medication Sig Dispense Refill  . aspirin 81 MG tablet Take 81 mg by mouth daily.      . b complex vitamins tablet Take 1 tablet by mouth daily.      . carvedilol (COREG) 6.25 MG tablet 6.25 mg 2 (two) times daily  with a meal.       . folic acid (FOLVITE) 400 MCG tablet Take 400 mcg by mouth daily.      . hydrochlorothiazide (HYDRODIURIL) 25 MG tablet 25 mg every morning.       . losartan (COZAAR) 100 MG tablet 100 mg daily.       . pravastatin (PRAVACHOL) 20 MG tablet 20 mg daily.        No current facility-administered medications for this visit.    Review of Systems Review of Systems  Constitutional: Negative.   HENT: Negative.   Eyes: Negative.   Respiratory: Negative.   Cardiovascular: Negative.   Gastrointestinal: Positive for nausea and abdominal pain.  Musculoskeletal: Negative.   Neurological: Negative.   Hematological: Negative.   All other systems reviewed and are negative.    There were no vitals taken for this visit.  Physical Exam Physical Exam  Constitutional: She is oriented to person, place, and time. She appears well-developed and well-nourished.  HENT:  Head: Normocephalic and atraumatic.  Eyes: Conjunctivae and EOM are normal. Pupils are equal, round, and reactive to light.  Neck: Normal range of motion. Neck supple.  Cardiovascular: Normal rate, regular rhythm and normal heart sounds.     Pulmonary/Chest: Effort normal and breath sounds normal.  Abdominal: Soft. Bowel sounds are normal. There is tenderness (RUq).  Musculoskeletal: Normal range of motion.  Neurological: She is alert and oriented to person, place, and time.    Data Reviewed Ultrasound of gallbladder  Reveals gallbladder sludge without gallbladder wall not thickened. T. Bili elevated at 1.14 alkaline phosphatase of 98 LFTs within normal limits.  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 We'll proceed to the operating room for a laparoscopic cholecystectomy with IOC. 2. We will obtain Surgical clearance from Dr. [name] prior to scheduling. 3. All risks and benefits were discussed with the patient to generally include: infection, bleeding, possible need for  post op ERCP, damage to the bile ducts, and bile leak. Alternatives were offered and described.  All questions were answered and the patient voiced understanding of the procedure and wishes to proceed at this point with a laparoscopic cholecystectomy         Marka Treloar Jr., Raynisha Avilla 12/07/2012, 2:01 PM    

## 2013-01-03 NOTE — Preoperative (Signed)
Beta Blockers   Reason not to administer Beta Blockers:Not Applicable 

## 2013-01-03 NOTE — Transfer of Care (Signed)
Immediate Anesthesia Transfer of Care Note  Patient: Jasmin Allen  Procedure(s) Performed: Procedure(s): LAPAROSCOPIC CHOLECYSTECTOMY attempted cholangiogram  Patient Location: PACU  Anesthesia Type:General  Level of Consciousness: awake, alert  and oriented  Airway & Oxygen Therapy: Patient Spontanous Breathing and Patient connected to nasal cannula oxygen  Post-op Assessment: Report given to PACU RN, Post -op Vital signs reviewed and stable and Patient moving all extremities  Post vital signs: Reviewed and stable  Complications: No apparent anesthesia complications

## 2013-01-04 ENCOUNTER — Telehealth (INDEPENDENT_AMBULATORY_CARE_PROVIDER_SITE_OTHER): Payer: Self-pay | Admitting: General Surgery

## 2013-01-04 ENCOUNTER — Encounter (HOSPITAL_COMMUNITY): Payer: Self-pay | Admitting: General Surgery

## 2013-01-04 NOTE — Telephone Encounter (Signed)
Spoke with pt and informed her that she has a PO appt w/ Dr. Derrell Lolling on 01/19/13 at 4:20 w/ arrival time 15 minutes earlier.

## 2013-01-05 ENCOUNTER — Telehealth (INDEPENDENT_AMBULATORY_CARE_PROVIDER_SITE_OTHER): Payer: Self-pay

## 2013-01-05 NOTE — Telephone Encounter (Signed)
Pt called stating she had a fever last night of 102.8 and this morning it is 101. She states incision looks good. No redness, swelling, chills. Advised she take a fever reducer and keep eye on incision for signs of infection. She will continue to monitor temperature and call if it should start going back up.

## 2013-01-10 ENCOUNTER — Encounter (HOSPITAL_COMMUNITY): Payer: Medicare Other | Attending: Oncology

## 2013-01-10 DIAGNOSIS — R599 Enlarged lymph nodes, unspecified: Secondary | ICD-10-CM | POA: Insufficient documentation

## 2013-01-10 DIAGNOSIS — D649 Anemia, unspecified: Secondary | ICD-10-CM

## 2013-01-10 DIAGNOSIS — R590 Localized enlarged lymph nodes: Secondary | ICD-10-CM

## 2013-01-10 LAB — CBC WITH DIFFERENTIAL/PLATELET
Basophils Absolute: 0 10*3/uL (ref 0.0–0.1)
Eosinophils Relative: 0 % (ref 0–5)
HCT: 35.4 % — ABNORMAL LOW (ref 36.0–46.0)
Lymphocytes Relative: 33 % (ref 12–46)
MCHC: 34.5 g/dL (ref 30.0–36.0)
MCV: 96.5 fL (ref 78.0–100.0)
Monocytes Absolute: 0.3 10*3/uL (ref 0.1–1.0)
RDW: 13.2 % (ref 11.5–15.5)
WBC: 4.5 10*3/uL (ref 4.0–10.5)

## 2013-01-10 LAB — COMPREHENSIVE METABOLIC PANEL
ALT: 13 U/L (ref 0–35)
Calcium: 9.6 mg/dL (ref 8.4–10.5)
Chloride: 101 mEq/L (ref 96–112)
GFR calc Af Amer: 81 mL/min — ABNORMAL LOW (ref >90)
GFR calc non Af Amer: 70 mL/min — ABNORMAL LOW (ref >90)
Potassium: 3.6 mEq/L (ref 3.5–5.1)
Sodium: 139 mEq/L (ref 135–145)

## 2013-01-10 LAB — SEDIMENTATION RATE: Sed Rate: 18 mm/hr (ref 0–22)

## 2013-01-10 LAB — LACTATE DEHYDROGENASE: LDH: 185 U/L (ref 94–250)

## 2013-01-10 NOTE — Progress Notes (Signed)
Labs drawn today for b77mic,cbc/diff,cmp,ldh,sed rate

## 2013-01-17 ENCOUNTER — Encounter (HOSPITAL_BASED_OUTPATIENT_CLINIC_OR_DEPARTMENT_OTHER): Payer: Medicare Other

## 2013-01-17 VITALS — BP 119/78 | HR 76 | Temp 97.7°F | Resp 20 | Wt 201.1 lb

## 2013-01-17 DIAGNOSIS — R590 Localized enlarged lymph nodes: Secondary | ICD-10-CM

## 2013-01-17 DIAGNOSIS — R599 Enlarged lymph nodes, unspecified: Secondary | ICD-10-CM

## 2013-01-17 NOTE — Progress Notes (Signed)
[facility] [phone]   [phone]  OFFICE PROGRESS NOTE  [name],[name], MD No address on file  DIAGNOSIS:  Left cervical lymphadenopathy.   INTERVAL HISTORY:   Jasmin Allen 71 y.o. female returns to the clinic today for follow up of left cervical lymphadenopathy. She was diagnosed in Jan ,13, 2014 with cervical lymphadenopathy which was excised.  Tissue flow cytometry and PCR for immunoglobulin heavy chain gene and BCL-2 failed to show evidence of lymphoma. Imaging studies also failed to show evidence of lymphoma. Patient is being clinically followed. She is accompanied by her husband today and tell me that she recently had the laparoscopic cholecystectomy done on 01/03/2013.  She also tell me that she recovering well. She denies  unintended weight loss, unexplained fever, lymphadenopathy, night sweats or loss of appetite.  Overall she feels well.  MEDICAL HISTORY: Past Medical History  Diagnosis Date  . Hypertension   . Hyperlipemia   . Heart murmur 2012    echo  . Wears glasses   . Arthritis   . PONV (postoperative nausea and vomiting)     ALLERGIES:  is allergic to penicillins and sulfa antibiotics.  MEDICATIONS: Reviewed.   SURGICAL HISTORY:  Past Surgical History  Procedure Laterality Date  . Carpal tunnel release  1985    rt hand  . Abdominal hysterectomy  1985  . Hammer toe surgery  10/01/2011    left toe  . Tonsillectomy    . Dilation and curettage of uterus      several  . Tubal ligation    . Lymph node biopsy  06/14/2012    Procedure: LYMPH NODE BIOPSY;  Surgeon: Currie Paris, MD;  Location: [facility];  Service: General;  Laterality: Left;  remove left neck lymph node  . Cholecystectomy  01/03/2013    Procedure: LAPAROSCOPIC CHOLECYSTECTOMY attempted cholangiogram;  Surgeon: Axel Filler, MD;  Location: [facility] OR;  Service: General;;     REVIEW OF SYSTEMS:14 point review of system is as in  the history above otherwise negative.  PHYSICAL EXAMINATION:  Blood pressure 119/78, pulse 76, temperature 97.7 F (36.5 C), temperature source Oral, resp. rate 20, weight 201 lb 1.6 oz (91.218 kg). GENERAL: No acute distress. SKIN:  No rashes or significant lesions . No ecchymosis or petechial rash. HEAD: Normocephalic, No masses, lesions, tenderness or abnormalities  EYES: Conjunctiva are pink and non-injected and no jaundice ENT: External ears normal ,lips, buccal mucosa, and tongue normal and mucous membranes are moist . No evidence of thrush. LYMPH: No palpable lymphadenopathy, in the neck, supraclavicular areas or axilla or inguinal areas. LUNGS: Clear to auscultation , no crackles or wheezes HEART: regular rate & rhythm, no murmurs, no gallops, S1 normal and S2 normal and no S3. ABDOMEN: Abdomen soft, fresh laparoscopic scar.  Deep  palpation not done due to her recent surgery. EXTREMITIES: No edema, no skin discoloration or tenderness      LABORATORY DATA: Lab Results  Component Value Date   WBC 4.5 01/10/2013   HGB 12.2 01/10/2013   HCT 35.4* 01/10/2013   MCV 96.5 01/10/2013   PLT 169 01/10/2013      Chemistry      Component Value Date/Time   NA 139 01/10/2013 1015   K 3.6 01/10/2013 1015   CL 101 01/10/2013 1015   CO2 31 01/10/2013 1015   BUN 9 01/10/2013 1015   CREATININE 0.83 01/10/2013 1015      Component Value Date/Time  CALCIUM 9.6 01/10/2013 1015   ALKPHOS 106 01/10/2013 1015   AST 12 01/10/2013 1015   ALT 13 01/10/2013 1015   BILITOT 0.7 01/10/2013 1015       ASSESSMENT:  Left cervical lymphadenopathy status post excision biopsy.  Patient has no clinical evidence of lymphoma or recurrence of lymphadenopathy at this time.  She also has no prior proven evidence of lymphoma.  PLAN:  1. Return to clinic in 6 months for follow up. CBC CMP LDH ordered for this visit. 2. I educated her on B. Symptoms(explained these to her) and signs of lymphoma. 3. Given no  evidence of lymphoma I do not feel that routine imaging scans are needed.  That said, if patient developed new lymphadenopathy further imaging may be warranted.  All questions were satisfactorily answered. Patient Was instructed to call if she notices any B. Symptoms or lymphadenopathy.  I reassured her there is no clinical evidence of lymphoma at this time.  I spent more than 50 % counseling the patient face to face. The total time spent in the appointment was 30 minutes.   Sherral Hammers, MD FACP. Hematology/Oncology.

## 2013-01-17 NOTE — Patient Instructions (Addendum)
[facility] Discharge Instructions  RECOMMENDATIONS MADE BY THE CONSULTANT AND ANY TEST RESULTS WILL BE SENT TO YOUR REFERRING PHYSICIAN.  EXAM FINDINGS BY THE PHYSICIAN TODAY AND SIGNS OR SYMPTOMS TO REPORT TO CLINIC OR PRIMARY PHYSICIAN: Exam and discussion by Dr. Sharia Reeve.  You are doing well.  MEDICATIONS PRESCRIBED:  none  INSTRUCTIONS GIVEN AND DISCUSSED: Report any new lumps, fevers, unexplained weight loss, night sweats, etc.  SPECIAL INSTRUCTIONS/FOLLOW-UP: Follow-up in 6 months with blood work and MD visit.  Thank you for choosing [facility] to provide your oncology and hematology care.  To afford each patient quality time with our providers, please arrive at least 15 minutes before your scheduled appointment time.  With your help, our goal is to use those 15 minutes to complete the necessary work-up to ensure our physicians have the information they need to help with your evaluation and healthcare recommendations.    Effective January 1st, 2014, we ask that you re-schedule your appointment with our physicians should you arrive 10 or more minutes late for your appointment.  We strive to give you quality time with our providers, and arriving late affects you and other patients whose appointments are after yours.    Again, thank you for choosing [facility].  Our hope is that these requests will decrease the amount of time that you wait before being seen by our physicians.       _____________________________________________________________  Should you have questions after your visit to [facility], please contact our office at [phone] between the hours of 8:30 a.m. and 5:00 p.m.  Voicemails left after 4:30 p.m. will not be returned until the following business day.  For prescription refill requests, have your pharmacy contact our office with your prescription refill request.

## 2013-01-19 ENCOUNTER — Encounter (INDEPENDENT_AMBULATORY_CARE_PROVIDER_SITE_OTHER): Payer: Self-pay | Admitting: General Surgery

## 2013-01-19 ENCOUNTER — Ambulatory Visit (INDEPENDENT_AMBULATORY_CARE_PROVIDER_SITE_OTHER): Payer: Medicare Other | Admitting: General Surgery

## 2013-01-19 VITALS — BP 118/62 | HR 78 | Resp 18 | Ht 65.0 in | Wt 202.0 lb

## 2013-01-19 DIAGNOSIS — Z9049 Acquired absence of other specified parts of digestive tract: Secondary | ICD-10-CM

## 2013-01-19 DIAGNOSIS — Z9889 Other specified postprocedural states: Secondary | ICD-10-CM

## 2013-01-19 NOTE — Progress Notes (Signed)
Patient ID: SKILER TYE, female   DOB: 12/07/1940, 73 y.o.   MRN: [id number] The patient is a 72 year old female status post laparoscopic cholecystectomy with IOC.  The patient has been doing well postoperatively without any complaints. She did have a fever of 1-20 postoperatively but over the last 24 hours. He has not returned since that time.  Pathology: Revealed chronic cholecystitis and cholelithiasis. This was discussed with the patient.  On exam: Wounds are clean dry and intact.  Assessment and plan: The patient is a 72 year old female status post laparoscopic cholecystectomy with IOC 1. The patient to follow up as needed 2. I discussed with her what lifting restrictions for another month.

## 2013-07-03 DIAGNOSIS — Z8709 Personal history of other diseases of the respiratory system: Secondary | ICD-10-CM

## 2013-07-03 HISTORY — DX: Personal history of other diseases of the respiratory system: Z87.09

## 2013-07-18 ENCOUNTER — Encounter (HOSPITAL_COMMUNITY): Payer: Medicare Other | Attending: Hematology and Oncology

## 2013-07-18 DIAGNOSIS — R599 Enlarged lymph nodes, unspecified: Secondary | ICD-10-CM | POA: Insufficient documentation

## 2013-07-18 DIAGNOSIS — R161 Splenomegaly, not elsewhere classified: Secondary | ICD-10-CM | POA: Insufficient documentation

## 2013-07-18 DIAGNOSIS — R0982 Postnasal drip: Secondary | ICD-10-CM | POA: Insufficient documentation

## 2013-07-18 DIAGNOSIS — R590 Localized enlarged lymph nodes: Secondary | ICD-10-CM

## 2013-07-18 DIAGNOSIS — I10 Essential (primary) hypertension: Secondary | ICD-10-CM | POA: Insufficient documentation

## 2013-07-18 DIAGNOSIS — E785 Hyperlipidemia, unspecified: Secondary | ICD-10-CM | POA: Insufficient documentation

## 2013-07-18 DIAGNOSIS — R011 Cardiac murmur, unspecified: Secondary | ICD-10-CM | POA: Insufficient documentation

## 2013-07-18 DIAGNOSIS — Z880 Allergy status to penicillin: Secondary | ICD-10-CM | POA: Insufficient documentation

## 2013-07-18 LAB — COMPREHENSIVE METABOLIC PANEL WITH GFR
ALT: 13 U/L (ref 0–35)
AST: 14 U/L (ref 0–37)
Albumin: 4 g/dL (ref 3.5–5.2)
Alkaline Phosphatase: 101 U/L (ref 39–117)
BUN: 12 mg/dL (ref 6–23)
CO2: 30 meq/L (ref 19–32)
Calcium: 9.3 mg/dL (ref 8.4–10.5)
Chloride: 101 meq/L (ref 96–112)
Creatinine, Ser: 0.89 mg/dL (ref 0.50–1.10)
GFR calc Af Amer: 74 mL/min — ABNORMAL LOW
GFR calc non Af Amer: 64 mL/min — ABNORMAL LOW
Glucose, Bld: 115 mg/dL — ABNORMAL HIGH (ref 70–99)
Potassium: 4.2 meq/L (ref 3.7–5.3)
Sodium: 142 meq/L (ref 137–147)
Total Bilirubin: 0.7 mg/dL (ref 0.3–1.2)
Total Protein: 7 g/dL (ref 6.0–8.3)

## 2013-07-18 LAB — CBC WITH DIFFERENTIAL/PLATELET
BASOS PCT: 0 % (ref 0–1)
Basophils Absolute: 0 10*3/uL (ref 0.0–0.1)
EOS ABS: 0 10*3/uL (ref 0.0–0.7)
Eosinophils Relative: 0 % (ref 0–5)
HEMATOCRIT: 35.3 % — AB (ref 36.0–46.0)
HEMOGLOBIN: 12.4 g/dL (ref 12.0–15.0)
LYMPHS ABS: 1.8 10*3/uL (ref 0.7–4.0)
Lymphocytes Relative: 37 % (ref 12–46)
MCH: 33.4 pg (ref 26.0–34.0)
MCHC: 35.1 g/dL (ref 30.0–36.0)
MCV: 95.1 fL (ref 78.0–100.0)
MONO ABS: 0.4 10*3/uL (ref 0.1–1.0)
MONOS PCT: 9 % (ref 3–12)
NEUTROS ABS: 2.6 10*3/uL (ref 1.7–7.7)
Neutrophils Relative %: 54 % (ref 43–77)
Platelets: 159 10*3/uL (ref 150–400)
RBC: 3.71 MIL/uL — AB (ref 3.87–5.11)
RDW: 13.4 % (ref 11.5–15.5)
WBC: 4.9 10*3/uL (ref 4.0–10.5)

## 2013-07-18 LAB — LACTATE DEHYDROGENASE: LDH: 211 U/L (ref 94–250)

## 2013-07-18 NOTE — Progress Notes (Signed)
Labs drawn today for cbc/diff,ldh,cmp,b41mic

## 2013-07-19 LAB — BETA 2 MICROGLOBULIN, SERUM: BETA 2 MICROGLOBULIN: 2.25 mg/L — AB (ref 1.01–1.73)

## 2013-07-20 ENCOUNTER — Encounter (HOSPITAL_BASED_OUTPATIENT_CLINIC_OR_DEPARTMENT_OTHER): Payer: Medicare Other

## 2013-07-20 ENCOUNTER — Other Ambulatory Visit (HOSPITAL_COMMUNITY): Payer: Self-pay | Admitting: Hematology and Oncology

## 2013-07-20 ENCOUNTER — Encounter (HOSPITAL_COMMUNITY): Payer: Self-pay

## 2013-07-20 VITALS — BP 108/71 | HR 68 | Temp 97.8°F | Resp 16 | Wt 204.3 lb

## 2013-07-20 DIAGNOSIS — R161 Splenomegaly, not elsewhere classified: Secondary | ICD-10-CM

## 2013-07-20 DIAGNOSIS — R599 Enlarged lymph nodes, unspecified: Secondary | ICD-10-CM

## 2013-07-20 DIAGNOSIS — R590 Localized enlarged lymph nodes: Secondary | ICD-10-CM

## 2013-07-20 MED ORDER — FLUTICASONE PROPIONATE 50 MCG/ACT NA SUSP: 2.0000 | Freq: Every day | NASAL | Status: DC

## 2013-07-20 NOTE — Progress Notes (Signed)
[place], MD No address on file  DIAGNOSIS: Lymphadenopathy of left cervical region - Plan: Beta 2 microglobuline, serum  Splenomegaly  Chief Complaint  Patient presents with  . 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  INTERVAL HISTORY: Jasmin Allen 73 y.o. female returns for followup of cervical lymphadenopathy, status post biopsy in January 2014 at which time atypical lymphoid hyperplasia was noted with negative BCL-2 and heavy chain PCR. She continues to have postnasal drip and uses Claritin-D periodically when she feels more stuffy. Appetite is good with no nausea, vomiting, fever, night sweats, diarrhea, constipation, melena, hematochezia, hematuria, epistaxis, hemoptysis, lower extremity swelling or redness, PND, orthopnea, palpitations, or worsening cervical lymphadenopathy.  MEDICAL HISTORY: Past Medical History  Diagnosis Date  . Hypertension   . Hyperlipemia   . Heart murmur 2012    echo  . Wears glasses   . Arthritis   . PONV (postoperative nausea and vomiting)   . Cervical lymphadenopathy     INTERIM HISTORY: has Lymphadenopathy of left cervical region on her problem list.    ALLERGIES:  is allergic to penicillins and sulfa antibiotics.  MEDICATIONS: has a current medication list which includes the following prescription(s): acetaminophen, aspirin, b complex vitamins, carvedilol, vitamin d, co q 10, folic acid, hydrochlorothiazide, losartan, pravastatin, fluticasone, and oxycodone-acetaminophen.  SURGICAL HISTORY:  Past Surgical History  Procedure Laterality Date  . Carpal tunnel release  1985    rt hand  . Abdominal hysterectomy  1985  . Hammer toe surgery  10/01/2011    left toe  . Tonsillectomy    . Dilation and curettage of uterus      several  . Tubal ligation    . Lymph  node biopsy  06/14/2012    Procedure: LYMPH NODE BIOPSY;  Surgeon: Haywood Lasso, MD;  Location: [facility];  Service: General;  Laterality: Left;  remove left neck lymph node  . Cholecystectomy  01/03/2013    Procedure: LAPAROSCOPIC CHOLECYSTECTOMY attempted cholangiogram;  Surgeon: Ralene Ok, MD;  Location: [facility] OR;  Service: General;;    FAMILY HISTORY: family history includes COPD in her father; Hypertension in her mother; Stroke in her maternal grandfather.  SOCIAL HISTORY:  reports that she has never smoked. She has never used smokeless tobacco. She reports that she drinks alcohol. She reports that she does not use illicit drugs.  REVIEW OF SYSTEMS:  Other than that discussed above is noncontributory.  PHYSICAL EXAMINATION: ECOG PERFORMANCE STATUS: 0 - Asymptomatic  Blood pressure 108/71, pulse 68, temperature 97.8 F (36.6 C), temperature source Oral, resp. rate 16, weight 204 lb 4.8 oz (92.67 kg).  GENERAL:alert, no distress and comfortable SKIN: skin color, texture, turgor are normal, no rashes or significant lesions EYES: PERLA; Conjunctiva are pink and non-injected, sclera clear OROPHARYNX: Posterior pharyngeal exudate. No evidence of bleeding. NECK: supple, thyroid normal size, non-tender, without nodularity. No masses CHEST: Normal AP diameter with no breast masses. LYMPH:  no palpable lymphadenopathy in the cervical, axillary or inguinal LUNGS: clear to auscultation and percussion with normal breathing effort HEART: regular rate & rhythm and no murmurs. ABDOMEN:abdomen soft, non-tender and normal bowel sounds MUSCULOSKELETAL:no cyanosis of digits and no clubbing. Range of motion normal.  NEURO: alert & oriented x 3 with fluent speech, no focal motor/sensory deficits   LABORATORY DATA: Infusion on  07/18/2013  Component Date Value Ref Range Status  . WBC 07/18/2013 4.9  4.0 - 10.5 K/uL Final  . RBC 07/18/2013 3.71* 3.87 - 5.11 MIL/uL Final  .  Hemoglobin 07/18/2013 12.4  12.0 - 15.0 g/dL Final  . HCT 76/78/1183 35.3* 36.0 - 46.0 % Final  . MCV 07/18/2013 95.1  78.0 - 100.0 fL Final  . MCH 07/18/2013 33.4  26.0 - 34.0 pg Final  . MCHC 07/18/2013 35.1  30.0 - 36.0 g/dL Final  . RDW 74/00/2140 13.4  11.5 - 15.5 % Final  . Platelets 07/18/2013 159  150 - 400 K/uL Final  . Neutrophils Relative % 07/18/2013 54  43 - 77 % Final  . Neutro Abs 07/18/2013 2.6  1.7 - 7.7 K/uL Final  . Lymphocytes Relative 07/18/2013 37  12 - 46 % Final  . Lymphs Abs 07/18/2013 1.8  0.7 - 4.0 K/uL Final  . Monocytes Relative 07/18/2013 9  3 - 12 % Final  . Monocytes Absolute 07/18/2013 0.4  0.1 - 1.0 K/uL Final  . Eosinophils Relative 07/18/2013 0  0 - 5 % Final  . Eosinophils Absolute 07/18/2013 0.0  0.0 - 0.7 K/uL Final  . Basophils Relative 07/18/2013 0  0 - 1 % Final  . Basophils Absolute 07/18/2013 0.0  0.0 - 0.1 K/uL Final  . LDH 07/18/2013 211  94 - 250 U/L Final  . Sodium 07/18/2013 142  137 - 147 mEq/L Final  . Potassium 07/18/2013 4.2  3.7 - 5.3 mEq/L Final  . Chloride 07/18/2013 101  96 - 112 mEq/L Final  . CO2 07/18/2013 30  19 - 32 mEq/L Final  . Glucose, Bld 07/18/2013 115* 70 - 99 mg/dL Final  . BUN 69/14/5886 12  6 - 23 mg/dL Final  . Creatinine, Ser 07/18/2013 0.89  0.50 - 1.10 mg/dL Final  . Calcium 91/91/2443 9.3  8.4 - 10.5 mg/dL Final  . Total Protein 07/18/2013 7.0  6.0 - 8.3 g/dL Final  . Albumin 90/98/3180 4.0  3.5 - 5.2 g/dL Final  . AST 15/27/0703 14  0 - 37 U/L Final  . ALT 07/18/2013 13  0 - 35 U/L Final  . Alkaline Phosphatase 07/18/2013 101  39 - 117 U/L Final  . Total Bilirubin 07/18/2013 0.7  0.3 - 1.2 mg/dL Final  . GFR calc non Af Amer 07/18/2013 64* >90 mL/min Final  . GFR calc Af Amer 07/18/2013 74* >90 mL/min Final   Comment: (NOTE)                          The eGFR has been calculated using the CKD EPI equation.                          This calculation has not been validated in all clinical situations.                           eGFR's persistently <90 mL/min signify possible Chronic Kidney                          Disease.  . Beta-2 Microglobulin 07/18/2013 2.25* 1.01 - 1.73 mg/L Final   Performed at [facility]    PATHOLOGY:  FINAL for Jasmin Allen ([id number]) Patient Name: Jasmin Allen Accession #: [id number] DOB: Apr 03, 1941 Age: 44 Gender: F Client Name [facility].  [facility] Collected Date: 06/14/2012 Received Date: 06/14/2012 Physician: Neldon Mc Chart #: MRN # : [id number] Physician cc: Race:W Visit #: [id number] REPORT OF SURGICAL PATHOLOGY ADDITIONAL INFORMATION: PCR for immunoglobulin heavy chain gene and BCL-2 gene were performed on paraffin embedded tissue at the [facility] in [place], [place]. Both results are negative. Despite the negative results, close clinical follow-up and excisional biopsy is recommended if adenopathy is persistent or recurrent. 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 Microscopic Comment The sections show partial effacement of the lymph node architecture by an atypical lymphoid proliferation characterized by predominance of small to medium sized lymphoid cells with round to slightly irregular nuclei, dense chromatin, and small to inconspicuous nucleoli. The atypical lymphoid cells show scanty to moderately abundant cytoplasm which often appears clear giving a monocytoid appearance to many of the atypical cells. The atypical lymphoid cells form nodules and intersecting bands as well as small diffuse areas which occasionally surround variably sized but reactive appearing germinal centers. Obvious proliferation centers or atypical follicular structures are not seen. No necrosis or increased mitosis is  seen. In this background, scattered small foci of paracortical, reactive appearing tissue with scattered histiocytes are also seen. Flow cytometric analysis was performed but failed to show any monoclonal B cell population or abnormal B cell phenotype. Immunohistochemical stains for CD20, CD21, CD79a, CD3, CD43, CD5, CD10, Cyclin D1, BCL2, and BCL6 were performed with appropriate controls. The atypical lymphoid cells in question stain positively for B cell markers CD79a and CD20 in addition to staining for BCL2 protein. The cells in question are negative for Cyclin D1, CD5, CD21, CD10, and BCL6. CD21 in particular highlights many of the follicular dendritic networks throughout the lymph node. CD3, CD43 and CD5 highlights the admixed T cell component 1 of 2 FINAL for [facility], [place] J [phone]) Microscopic Comment(continued) present in the background. BCL-2 is negative in scattered germinal centers present. The morphologic and immunoperoxidase findings are highly suspicious for partial involvement by low grade non-Hodgkin's B cell lymphoma, particularly marginal zone lymphoma. However I am unable to confirm this suspicion by flow cytometric analysis which essentially shows a polyclonal B cell population. The latter discrepancy may be due to sampling. Gene rearrangement study on paraffin embedded tissue will be performed and the results reported in an addendum. (BNS:kh 06-17-12) Susanne Greenhouse MD Pathologist, Electronic Signature (Case signed 06/17/2012) Specimen Gross and Clinical Information Specimen(s) Obtained: Lymph node for lymphoma, Left neck Specimen Clinical Information Lymphadenopathy left neck (tl) Gross Received fresh is a 1.3 x 1.2 x 0.8 cm rubbery, tan yellow ovoid nodule. The cut surfaces are solid and homogeneous. Touch preparations are made from the cut surface and a portion of the specimen is placed in RPMI for flow cytometry. The remainder of the specimen is entirely  submitted in two cassettes. (GP:eps 06/14/12) Stain(s) used in Diagnosis: The following stain(s) were used in diagnosing the case: BCl 6 , CD 79a, CD 5, BCl 2, CD 3, CD 43, CD-10, CD 21, CD 20, CYCLIN D1. The control(s) stained appropriately. Disclaimer Some of these immunohistochemical stains may have been developed and the performance characteristics determined by [facility]. Some may not have been cleared or approved by the U.S. Food and Drug Administration. The FDA has determined that such clearance or approval is not necessary. This test is used for clinical purposes. It  should not be regarded as investigational or for research. This laboratory is certified under the [employer] (CLIA-88) as qualified to perform high complexity clinical laboratory testing. Report signed out from the following location(s) [facility],[place] [address].[id number]., [address], [place],    FINAL for LEIGH, BLAS ([id number]) Patient Name: HAYLEE, MCANANY Accession #: [id number] DOB: 1940/12/13 Age: 37 Gender: F Client Name . [facility] Collected Date: 06/14/2012 Received Date: 06/14/2012 Physician: Neldon Mc Chart #: MRN # : [id number] Physician cc: Race:W Visit #: [id number] FLOW CYTOMETRY REPORT INTERPRETATION Interpretation Tissue-Flow Cytometry - NO MONOCLONAL B-CELL POPULATION OR ABNORMAL B-CELL PHENOTYPE IDENTIFIED. - T-CELLS WITH A PREDOMINANT HELPER PHENOTYPE (NONSPECIFIC) Susanne Greenhouse MD Pathologist, Electronic Signature (Case signed 06/17/2012) GROSS AND MICROSCOPIC INFORMATION Source Tissue-Flow Cytometry Microscopic Gated population: Flow cytometric immunophenotyping is performed using antibiodies to the antigens listed in the table below. Electronic gates are placed around a cell cluster displaying light  scatter properties corresponding to lymphocytes. - Abnormal Cells in gated population: NA - Phenotype of Abnormal Cells:    Urinalysis No results found for this basename: colorurine,  appearanceur,  labspec,  phurine,  glucoseu,  hgbur,  bilirubinur,  ketonesur,  proteinur,  urobilinogen,  nitrite,  leukocytesur    RADIOGRAPHIC STUDIES: No results found.  ASSESSMENT:  #1. Reactive cervical lymphadenopathy, no evidence of progression. #2. Perennial rhinitis. #3. Hypertension, controlled.   PLAN:  #1. Flonase 2 puffs in these nostril at bedtime. #2. Followup in 6 months with lab tests. Patient was told to call should she develop increasing fatigue, worsening lymphadenopathy, fever, night sweats, or dark urine.   All questions were answered. The patient knows to call the clinic with any problems, questions or concerns. We can certainly see the patient much sooner if necessary.   I spent 25 minutes counseling the patient face to face. The total time spent in the appointment was 30 minutes.    Doroteo Bradford, MD 07/20/2013 10:59 AM

## 2013-07-20 NOTE — Patient Instructions (Signed)
[facility] Discharge Instructions  RECOMMENDATIONS MADE BY THE CONSULTANT AND ANY TEST RESULTS WILL BE SENT TO YOUR REFERRING PHYSICIAN.  EXAM FINDINGS BY THE PHYSICIAN TODAY AND SIGNS OR SYMPTOMS TO REPORT TO CLINIC OR PRIMARY PHYSICIAN: Exam and findings as discussed by Dr. Barnet Glasgow.  Labs were stable.  Report night sweats, fevers, new lumps, unexplained weight loss or other problems.  MEDICATIONS PRESCRIBED:  Flonase - use as directed   INSTRUCTIONS/FOLLOW-UP: Follow-up in 6 months with labs and MD visit.  Thank you for choosing [facility] to provide your oncology and hematology care.  To afford each patient quality time with our providers, please arrive at least 15 minutes before your scheduled appointment time.  With your help, our goal is to use those 15 minutes to complete the necessary work-up to ensure our physicians have the information they need to help with your evaluation and healthcare recommendations.    Effective January 1st, 2014, we ask that you re-schedule your appointment with our physicians should you arrive 10 or more minutes late for your appointment.  We strive to give you quality time with our providers, and arriving late affects you and other patients whose appointments are after yours.    Again, thank you for choosing [facility].  Our hope is that these requests will decrease the amount of time that you wait before being seen by our physicians.       _____________________________________________________________  Should you have questions after your visit to [facility], please contact our office at [phone] between the hours of 8:30 a.m. and 5:00 p.m.  Voicemails left after 4:30 p.m. will not be returned until the following business day.  For prescription refill requests, have your pharmacy contact our office with your prescription refill request.

## 2013-12-28 ENCOUNTER — Other Ambulatory Visit (HOSPITAL_COMMUNITY): Payer: Self-pay

## 2013-12-28 DIAGNOSIS — R590 Localized enlarged lymph nodes: Secondary | ICD-10-CM

## 2014-01-13 ENCOUNTER — Encounter (HOSPITAL_COMMUNITY): Payer: Medicare Other | Attending: Hematology and Oncology

## 2014-01-13 DIAGNOSIS — R011 Cardiac murmur, unspecified: Secondary | ICD-10-CM | POA: Diagnosis not present

## 2014-01-13 DIAGNOSIS — Z882 Allergy status to sulfonamides status: Secondary | ICD-10-CM | POA: Insufficient documentation

## 2014-01-13 DIAGNOSIS — Z79899 Other long term (current) drug therapy: Secondary | ICD-10-CM | POA: Diagnosis not present

## 2014-01-13 DIAGNOSIS — R599 Enlarged lymph nodes, unspecified: Secondary | ICD-10-CM | POA: Diagnosis not present

## 2014-01-13 DIAGNOSIS — Z8249 Family history of ischemic heart disease and other diseases of the circulatory system: Secondary | ICD-10-CM | POA: Insufficient documentation

## 2014-01-13 DIAGNOSIS — Z9889 Other specified postprocedural states: Secondary | ICD-10-CM | POA: Insufficient documentation

## 2014-01-13 DIAGNOSIS — R161 Splenomegaly, not elsewhere classified: Secondary | ICD-10-CM | POA: Diagnosis present

## 2014-01-13 DIAGNOSIS — I10 Essential (primary) hypertension: Secondary | ICD-10-CM | POA: Diagnosis not present

## 2014-01-13 DIAGNOSIS — Z7982 Long term (current) use of aspirin: Secondary | ICD-10-CM | POA: Diagnosis not present

## 2014-01-13 DIAGNOSIS — E785 Hyperlipidemia, unspecified: Secondary | ICD-10-CM | POA: Insufficient documentation

## 2014-01-13 DIAGNOSIS — M129 Arthropathy, unspecified: Secondary | ICD-10-CM | POA: Insufficient documentation

## 2014-01-13 DIAGNOSIS — J309 Allergic rhinitis, unspecified: Secondary | ICD-10-CM | POA: Insufficient documentation

## 2014-01-13 DIAGNOSIS — Z880 Allergy status to penicillin: Secondary | ICD-10-CM | POA: Insufficient documentation

## 2014-01-13 DIAGNOSIS — R590 Localized enlarged lymph nodes: Secondary | ICD-10-CM

## 2014-01-13 LAB — CBC WITH DIFFERENTIAL/PLATELET
BASOS PCT: 0 % (ref 0–1)
Basophils Absolute: 0 10*3/uL (ref 0.0–0.1)
Eosinophils Absolute: 0 10*3/uL (ref 0.0–0.7)
Eosinophils Relative: 0 % (ref 0–5)
HCT: 34.2 % — ABNORMAL LOW (ref 36.0–46.0)
HEMOGLOBIN: 11.9 g/dL — AB (ref 12.0–15.0)
Lymphocytes Relative: 33 % (ref 12–46)
Lymphs Abs: 1.1 10*3/uL (ref 0.7–4.0)
MCH: 33.2 pg (ref 26.0–34.0)
MCHC: 34.8 g/dL (ref 30.0–36.0)
MCV: 95.5 fL (ref 78.0–100.0)
MONOS PCT: 8 % (ref 3–12)
Monocytes Absolute: 0.3 10*3/uL (ref 0.1–1.0)
NEUTROS ABS: 1.9 10*3/uL (ref 1.7–7.7)
Neutrophils Relative %: 59 % (ref 43–77)
PLATELETS: 150 10*3/uL (ref 150–400)
RBC: 3.58 MIL/uL — AB (ref 3.87–5.11)
RDW: 13.8 % (ref 11.5–15.5)
WBC: 3.2 10*3/uL — ABNORMAL LOW (ref 4.0–10.5)

## 2014-01-13 LAB — COMPREHENSIVE METABOLIC PANEL
ALK PHOS: 96 U/L (ref 39–117)
ALT: 14 U/L (ref 0–35)
AST: 17 U/L (ref 0–37)
Albumin: 3.9 g/dL (ref 3.5–5.2)
Anion gap: 11 (ref 5–15)
BILIRUBIN TOTAL: 0.9 mg/dL (ref 0.3–1.2)
BUN: 13 mg/dL (ref 6–23)
CHLORIDE: 101 meq/L (ref 96–112)
CO2: 30 mEq/L (ref 19–32)
Calcium: 9.4 mg/dL (ref 8.4–10.5)
Creatinine, Ser: 0.86 mg/dL (ref 0.50–1.10)
GFR calc Af Amer: 77 mL/min — ABNORMAL LOW (ref >90)
GFR calc non Af Amer: 66 mL/min — ABNORMAL LOW (ref >90)
Glucose, Bld: 144 mg/dL — ABNORMAL HIGH (ref 70–99)
POTASSIUM: 4.1 meq/L (ref 3.7–5.3)
SODIUM: 142 meq/L (ref 137–147)
Total Protein: 6.7 g/dL (ref 6.0–8.3)

## 2014-01-13 LAB — LACTATE DEHYDROGENASE: LDH: 208 U/L (ref 94–250)

## 2014-01-16 LAB — BETA 2 MICROGLOBULIN, SERUM: Beta-2 Microglobulin: 2.91 mg/L — ABNORMAL HIGH (ref <=2.51)

## 2014-01-18 ENCOUNTER — Ambulatory Visit (HOSPITAL_COMMUNITY): Payer: Medicare Other

## 2014-01-19 ENCOUNTER — Encounter (HOSPITAL_COMMUNITY): Payer: Self-pay

## 2014-01-19 ENCOUNTER — Encounter (HOSPITAL_BASED_OUTPATIENT_CLINIC_OR_DEPARTMENT_OTHER): Payer: Medicare Other

## 2014-01-19 VITALS — BP 120/59 | HR 74 | Temp 98.0°F | Resp 16 | Wt 204.1 lb

## 2014-01-19 DIAGNOSIS — R161 Splenomegaly, not elsewhere classified: Secondary | ICD-10-CM

## 2014-01-19 DIAGNOSIS — I10 Essential (primary) hypertension: Secondary | ICD-10-CM

## 2014-01-19 DIAGNOSIS — R590 Localized enlarged lymph nodes: Secondary | ICD-10-CM

## 2014-01-19 DIAGNOSIS — R599 Enlarged lymph nodes, unspecified: Secondary | ICD-10-CM

## 2014-01-19 NOTE — Progress Notes (Signed)
Bearden, MD No address on file  DIAGNOSIS: Lymphadenopathy of left cervical region  Splenomegaly  Chief Complaint  Patient presents with  . Cervical lymphadenopathy    CURRENT THERAPY: Watchful expectation  INTERVAL HISTORY: Jasmin Allen 73 y.o. female returns for followup of cervical lymphadenopathy, status post biopsy in January 2014 at which time atypical lymphoid hyperplasia was noted with negative BCL-2 and heavy chain PCR. Her husband is currently undergoing combined modality therapy for stage III lung cancer. She herself has not had any problems with allergies at this time of year. Appetite is good with no nausea, vomiting, diarrhea, constipation, vaginal bleeding or discharge, incontinence, lower extremity swelling or redness, easy satiety, new lymphadenopathy, cough, wheezing, PND, orthopnea, palpitations, or headache, or seizures.    MEDICAL HISTORY: Past Medical History  Diagnosis Date  . Hypertension   . Hyperlipemia   . Heart murmur 2012    echo  . Wears glasses   . Arthritis   . PONV (postoperative nausea and vomiting)   . Cervical lymphadenopathy   . H/O bronchitis 07/2013    INTERIM HISTORY: has Lymphadenopathy of left cervical region on her problem list.    ALLERGIES:  is allergic to penicillins and sulfa antibiotics.  MEDICATIONS: has a current medication list which includes the following prescription(s): acetaminophen, aspirin, b complex vitamins, calcium, carvedilol, vitamin d, co q 10, fluticasone, folic acid, hydrochlorothiazide, losartan, and pravastatin.  SURGICAL HISTORY:  Past Surgical History  Procedure Laterality Date  . Carpal tunnel release  1985    rt hand  . Abdominal hysterectomy  1985  . Hammer toe surgery  10/01/2011    left toe  . Tonsillectomy    . Dilation and curettage of uterus      several  . Tubal ligation    . Lymph node biopsy   06/14/2012    Procedure: LYMPH NODE BIOPSY;  Surgeon: Haywood Lasso, MD;  Location: [facility];  Service: General;  Laterality: Left;  remove left neck lymph node  . Cholecystectomy  01/03/2013    Procedure: LAPAROSCOPIC CHOLECYSTECTOMY attempted cholangiogram;  Surgeon: Ralene Ok, MD;  Location: [facility];  Service: General;;    FAMILY HISTORY: family history includes COPD in her father; Hypertension in her mother; Stroke in her maternal grandfather.  SOCIAL HISTORY:  reports that she has never smoked. She has never used smokeless tobacco. She reports that she drinks alcohol. She reports that she does not use illicit drugs.  REVIEW OF SYSTEMS:  Other than that discussed above is noncontributory.  PHYSICAL EXAMINATION: ECOG PERFORMANCE STATUS: 0 - Asymptomatic  Blood pressure 120/59, pulse 74, temperature 98 F (36.7 C), temperature source Oral, resp. rate 16, weight 204 lb 1.6 oz (92.579 kg).  GENERAL:alert, no distress and comfortable SKIN: skin color, texture, turgor are normal, no rashes or significant lesions EYES: PERLA; Conjunctiva are pink and non-injected, sclera clear SINUSES: No redness or tenderness over maxillary or ethmoid sinuses OROPHARYNX:no exudate, no erythema on lips, buccal mucosa, or tongue. NECK: supple, thyroid normal size, non-tender, without nodularity. No masses CHEST: Normal AP diameter with no breast masses. LYMPH:  no palpable lymphadenopathy in the cervical, axillary or inguinal LUNGS: clear to auscultation and percussion with normal breathing effort HEART: regular rate & rhythm and no murmurs. ABDOMEN:abdomen soft, non-tender and normal bowel sounds. Obese with spleen not palpable. No free fluid wave or shifting dullness. MUSCULOSKELETAL:no cyanosis of  digits and no clubbing. Range of motion normal.  NEURO: alert & oriented x 3 with fluent speech, no focal motor/sensory deficits   LABORATORY DATA: Appointment on 01/13/2014    Component Date Value Ref Range Status  . WBC 01/13/2014 3.2* 4.0 - 10.5 K/uL Final  . RBC 01/13/2014 3.58* 3.87 - 5.11 MIL/uL Final  . Hemoglobin 01/13/2014 11.9* 12.0 - 15.0 g/dL Final  . HCT 01/13/2014 34.2* 36.0 - 46.0 % Final  . MCV 01/13/2014 95.5  78.0 - 100.0 fL Final  . MCH 01/13/2014 33.2  26.0 - 34.0 pg Final  . MCHC 01/13/2014 34.8  30.0 - 36.0 g/dL Final  . RDW 01/13/2014 13.8  11.5 - 15.5 % Final  . Platelets 01/13/2014 150  150 - 400 K/uL Final  . Neutrophils Relative % 01/13/2014 59  43 - 77 % Final  . Neutro Abs 01/13/2014 1.9  1.7 - 7.7 K/uL Final  . Lymphocytes Relative 01/13/2014 33  12 - 46 % Final  . Lymphs Abs 01/13/2014 1.1  0.7 - 4.0 K/uL Final  . Monocytes Relative 01/13/2014 8  3 - 12 % Final  . Monocytes Absolute 01/13/2014 0.3  0.1 - 1.0 K/uL Final  . Eosinophils Relative 01/13/2014 0  0 - 5 % Final  . Eosinophils Absolute 01/13/2014 0.0  0.0 - 0.7 K/uL Final  . Basophils Relative 01/13/2014 0  0 - 1 % Final  . Basophils Absolute 01/13/2014 0.0  0.0 - 0.1 K/uL Final  . Sodium 01/13/2014 142  137 - 147 mEq/L Final  . Potassium 01/13/2014 4.1  3.7 - 5.3 mEq/L Final  . Chloride 01/13/2014 101  96 - 112 mEq/L Final  . CO2 01/13/2014 30  19 - 32 mEq/L Final  . Glucose, Bld 01/13/2014 144* 70 - 99 mg/dL Final  . BUN 01/13/2014 13  6 - 23 mg/dL Final  . Creatinine, Ser 01/13/2014 0.86  0.50 - 1.10 mg/dL Final  . Calcium 01/13/2014 9.4  8.4 - 10.5 mg/dL Final  . Total Protein 01/13/2014 6.7  6.0 - 8.3 g/dL Final  . Albumin 01/13/2014 3.9  3.5 - 5.2 g/dL Final  . AST 01/13/2014 17  0 - 37 U/L Final  . ALT 01/13/2014 14  0 - 35 U/L Final  . Alkaline Phosphatase 01/13/2014 96  39 - 117 U/L Final  . Total Bilirubin 01/13/2014 0.9  0.3 - 1.2 mg/dL Final  . GFR calc non Af Amer 01/13/2014 66* >90 mL/min Final  . GFR calc Af Amer 01/13/2014 77* >90 mL/min Final   Comment: (NOTE)                          The eGFR has been calculated using the CKD EPI equation.                           This calculation has not been validated in all clinical situations.                          eGFR's persistently <90 mL/min signify possible Chronic Kidney                          Disease.  . Anion gap 01/13/2014 11  5 - 15 Final  . LDH 01/13/2014 208  94 - 250 U/L Final  . Beta-2 Microglobulin 01/13/2014 2.91* <=2.51 mg/L Final  Performed at [facility]:  FINAL for Jasmin Allen, Jasmin Allen ([id number])  Patient Name: BRAYLEN, DENUNZIO Accession #: [id number]  DOB: August 11, 1940 Age: 59 Gender: F Client Name Jasmin Allen  Collected Date: 06/14/2012 Received Date: 06/14/2012 Physician: Neldon Mc  Chart #: MRN # : [id number] Physician cc:  Race:W Visit #: [id number]  REPORT OF SURGICAL PATHOLOGY  ADDITIONAL INFORMATION:  PCR for immunoglobulin heavy chain gene and BCL-2 gene were performed on paraffin embedded  tissue at the [facility] in [place], [place]. Both results are negative.  Despite the negative results, close clinical follow-up and excisional biopsy is recommended if  adenopathy is persistent or recurrent. (BNS:eps 06/28/12)  Susanne Greenhouse MD  Pathologist, Electronic Signature  ( Signed 06/28/2012)  FINAL DIAGNOSIS  Diagnosis  Lymph node for lymphoma, Left neck  ATYPICAL LYMPHOID PROLIFERATION SUSPICIOUS FOR NON-HODGKIN'S B-CELL LYMPHOMA,  SEE COMMENT.  Microscopic Comment  The sections show partial effacement of the lymph node architecture by an atypical lymphoid proliferation  characterized by predominance of small to medium sized lymphoid cells with round to slightly irregular nuclei,  dense chromatin, and small to inconspicuous nucleoli. The atypical lymphoid cells show scanty to moderately  abundant cytoplasm which often appears clear giving a monocytoid appearance to many of the atypical cells.  The atypical lymphoid cells form nodules and intersecting bands as well as small diffuse areas  which  occasionally surround variably sized but reactive appearing germinal centers. Obvious proliferation centers or  atypical follicular structures are not seen. No necrosis or increased mitosis is seen. In this background,  scattered small foci of paracortical, reactive appearing tissue with scattered histiocytes are also seen. Flow  cytometric analysis was performed but failed to show any monoclonal B cell population or abnormal B cell  phenotype. Immunohistochemical stains for CD20, CD21, CD79a, CD3, CD43, CD5, CD10, Cyclin D1,  BCL2, and BCL6 were performed with appropriate controls. The atypical lymphoid cells in question stain  positively for B cell markers CD79a and CD20 in addition to staining for BCL2 protein. The cells in question  are negative for Cyclin D1, CD5, CD21, CD10, and BCL6. CD21 in particular highlights many of the follicular  dendritic networks throughout the lymph node. CD3, CD43 and CD5 highlights the admixed T cell component  1 of 2  FINAL for [facility], Jasmin Allen [phone])  Microscopic Comment(continued)  present in the background. BCL-2 is negative in scattered germinal centers present. The morphologic and  immunoperoxidase findings are highly suspicious for partial involvement by low grade non-Hodgkin's B cell  lymphoma, particularly marginal zone lymphoma. However I am unable to confirm this suspicion by flow  cytometric analysis which essentially shows a polyclonal B cell population. The latter discrepancy may be due  to sampling. Gene rearrangement study on paraffin embedded tissue will be performed and the results  reported in an addendum. (BNS:kh 06-17-12)  Susanne Greenhouse MD  Pathologist, Electronic Signature  (Case signed 06/17/2012)  Specimen Gross and Clinical Information  Specimen(s) Obtained:  Lymph node for lymphoma, Left neck  Specimen Clinical Information  Lymphadenopathy left neck (tl)  Gross  Received fresh is a 1.3 x 1.2 x 0.8 cm rubbery, tan  yellow ovoid nodule. The cut surfaces are solid and  homogeneous. Touch preparations are made from the cut surface and a portion of the specimen is placed in  RPMI for flow cytometry. The remainder of the specimen is entirely submitted in two cassettes. (GP:eps  06/14/12)  Stain(s) used in Diagnosis:  The following stain(s) were used in diagnosing the case: BCl 6 , CD 79a, CD 5, BCl 2, CD 3, CD 43, CD-10,  CD 21, CD 20, CYCLIN D1. The control(s) stained appropriately.  Disclaimer  Some of these immunohistochemical stains may have been developed and the performance characteristics determined by [facility]. Some may not have been  cleared or approved by the U.S. Food and Drug Administration. The FDA has determined that such clearance or approval is not necessary. This test is used for clinical purposes.  It should not be regarded as investigational or for research. This laboratory is certified under the [place] (CLIA-88) as qualified to  perform high complexity clinical laboratory testing.  Report signed out from the following location(s)  [place].  [address],[place] [address].[id number].,  [address], [place],  FINAL for ABIGAEL, MOGLE ([id number])  Patient Name: Jasmin Allen, Jasmin Allen Accession #: [id number]  DOB: [birth year] Age: 30 Gender: F Client Name [facility]  Collected Date: 06/14/2012 Received Date: 06/14/2012 Physician: Neldon Mc  Chart #: MRN # : [id number] Physician cc:  Race:W Visit #: [id number]  FLOW CYTOMETRY REPORT  INTERPRETATION  Interpretation  Tissue-Flow Cytometry  - NO MONOCLONAL B-CELL POPULATION OR ABNORMAL B-CELL PHENOTYPE IDENTIFIED.  - T-CELLS WITH A PREDOMINANT HELPER PHENOTYPE (NONSPECIFIC)  Susanne Greenhouse MD  Pathologist, Electronic Signature  (Case signed 06/17/2012)  GROSS AND MICROSCOPIC  INFORMATION  Source  Tissue-Flow Cytometry  Microscopic  Gated population: Flow cytometric immunophenotyping is performed using antibiodies to the antigens listed in  the table below. Electronic gates are placed around a cell cluster displaying light scatter properties  corresponding to lymphocytes.  - Abnormal Cells in gated population: NA  - Phenotype of Abnormal Cells:        Urinalysis No results found for this basename: colorurine,  appearanceur,  labspec,  phurine,  glucoseu,  hgbur,  bilirubinur,  ketonesur,  proteinur,  urobilinogen,  nitrite,  leukocytesur    RADIOGRAPHIC STUDIES: No results found.  ASSESSMENT:  #1. Reactive cervical lymphadenopathy, no evidence of progression.  #2. Seasonal rhinitis, worse in the spring.  #3. Hypertension, controlled    PLAN:  #1. Continue watchful expectation but patient was told to call should she develop increasing fatigue, easy satiety, NuLev adenopathy, fever, night sweats, or persistent lower extremity swelling. #2. Followup in 6 months with CBC, chem profile, reticulocyte count, LDH, and beta-2 microglobulin.   All questions were answered. The patient knows to call the clinic with any problems, questions or concerns. We can certainly see the patient much sooner if necessary.   I spent 25 minutes counseling the patient face to face. The total time spent in the appointment was 30 minutes.    Doroteo Bradford, MD 01/19/2014 2:14 PM  DISCLAIMER:  This note was dictated with voice recognition software.  Similar sounding words can inadvertently be transcribed inaccurately and may not be corrected upon review.

## 2014-01-19 NOTE — Patient Instructions (Signed)
[facility] Discharge Instructions  RECOMMENDATIONS MADE BY THE CONSULTANT AND ANY TEST RESULTS WILL BE SENT TO YOUR REFERRING PHYSICIAN.  EXAM FINDINGS BY THE PHYSICIAN TODAY AND SIGNS OR SYMPTOMS TO REPORT TO CLINIC OR PRIMARY PHYSICIAN: Exam and findings as discussed by Dr. Barnet Glasgow.  Labs are stable.  Report night sweats, unexplained weight loss, fevers or other concerns.    INSTRUCTIONS/FOLLOW-UP: Follow-up in 6 months with labs and office visit.  Thank you for choosing [facility] to provide your oncology and hematology care.  To afford each patient quality time with our providers, please arrive at least 15 minutes before your scheduled appointment time.  With your help, our goal is to use those 15 minutes to complete the necessary work-up to ensure our physicians have the information they need to help with your evaluation and healthcare recommendations.    Effective January 1st, 2014, we ask that you re-schedule your appointment with our physicians should you arrive 10 or more minutes late for your appointment.  We strive to give you quality time with our providers, and arriving late affects you and other patients whose appointments are after yours.    Again, thank you for choosing [facility].  Our hope is that these requests will decrease the amount of time that you wait before being seen by our physicians.       _____________________________________________________________  Should you have questions after your visit to [facility], please contact our office at [phone] between the hours of 8:30 a.m. and 4:30 p.m.  Voicemails left after 4:30 p.m. will not be returned until the following business day.  For prescription refill requests, have your pharmacy contact our office with your prescription refill request.    _______________________________________________________________  We hope that we have given you very good  care.  You may receive a patient satisfaction survey in the mail, please complete it and return it as soon as possible.  We value your feedback!  _______________________________________________________________  Have you asked about our STAR program?  STAR stands for Survivorship Training and Rehabilitation, and this is a nationally recognized cancer care program that focuses on survivorship and rehabilitation.  Cancer and cancer treatments may cause problems, such as, pain, making you feel tired and keeping you from doing the things that you need or want to do. Cancer rehabilitation can help. Our goal is to reduce these troubling effects and help you have the best quality of life possible.  You may receive a survey from a nurse that asks questions about your current state of health.  Based on the survey results, all eligible patients will be referred to the [facility] program for an evaluation so we can better serve you!  A frequently asked questions sheet is available upon request.

## 2014-07-12 ENCOUNTER — Other Ambulatory Visit (HOSPITAL_COMMUNITY): Payer: Self-pay

## 2014-07-12 DIAGNOSIS — R590 Localized enlarged lymph nodes: Secondary | ICD-10-CM

## 2014-07-14 ENCOUNTER — Telehealth (HOSPITAL_COMMUNITY): Payer: Self-pay

## 2014-07-14 ENCOUNTER — Encounter (HOSPITAL_COMMUNITY): Payer: Medicare Other | Attending: Hematology & Oncology

## 2014-07-14 DIAGNOSIS — R590 Localized enlarged lymph nodes: Secondary | ICD-10-CM | POA: Diagnosis not present

## 2014-07-14 DIAGNOSIS — R599 Enlarged lymph nodes, unspecified: Secondary | ICD-10-CM

## 2014-07-14 DIAGNOSIS — Z9049 Acquired absence of other specified parts of digestive tract: Secondary | ICD-10-CM | POA: Insufficient documentation

## 2014-07-14 LAB — COMPREHENSIVE METABOLIC PANEL
ALT: 23 U/L (ref 0–35)
AST: 26 U/L (ref 0–37)
Albumin: 4.2 g/dL (ref 3.5–5.2)
Alkaline Phosphatase: 89 U/L (ref 39–117)
Anion gap: 5 (ref 5–15)
BILIRUBIN TOTAL: 0.9 mg/dL (ref 0.3–1.2)
BUN: 17 mg/dL (ref 6–23)
CALCIUM: 9 mg/dL (ref 8.4–10.5)
CO2: 29 mmol/L (ref 19–32)
CREATININE: 0.99 mg/dL (ref 0.50–1.10)
Chloride: 106 mmol/L (ref 96–112)
GFR, EST AFRICAN AMERICAN: 64 mL/min — AB (ref >90)
GFR, EST NON AFRICAN AMERICAN: 56 mL/min — AB (ref >90)
Glucose, Bld: 110 mg/dL — ABNORMAL HIGH (ref 70–99)
Potassium: 3.8 mmol/L (ref 3.5–5.1)
SODIUM: 140 mmol/L (ref 135–145)
Total Protein: 6.7 g/dL (ref 6.0–8.3)

## 2014-07-14 LAB — CBC WITH DIFFERENTIAL/PLATELET
BASOS PCT: 0 % (ref 0–1)
Basophils Absolute: 0 10*3/uL (ref 0.0–0.1)
EOS PCT: 0 % (ref 0–5)
Eosinophils Absolute: 0 10*3/uL (ref 0.0–0.7)
HEMATOCRIT: 34.9 % — AB (ref 36.0–46.0)
Hemoglobin: 11.9 g/dL — ABNORMAL LOW (ref 12.0–15.0)
Lymphocytes Relative: 30 % (ref 12–46)
Lymphs Abs: 1.3 10*3/uL (ref 0.7–4.0)
MCH: 32.5 pg (ref 26.0–34.0)
MCHC: 34.1 g/dL (ref 30.0–36.0)
MCV: 95.4 fL (ref 78.0–100.0)
MONOS PCT: 7 % (ref 3–12)
Monocytes Absolute: 0.3 10*3/uL (ref 0.1–1.0)
Neutro Abs: 2.8 10*3/uL (ref 1.7–7.7)
Neutrophils Relative %: 63 % (ref 43–77)
Platelets: 172 10*3/uL (ref 150–400)
RBC: 3.66 MIL/uL — AB (ref 3.87–5.11)
RDW: 13.5 % (ref 11.5–15.5)
WBC: 4.5 10*3/uL (ref 4.0–10.5)

## 2014-07-14 LAB — RETICULOCYTES
RBC.: 3.63 MIL/uL — ABNORMAL LOW (ref 3.87–5.11)
RETIC COUNT ABSOLUTE: 83.5 10*3/uL (ref 19.0–186.0)
Retic Ct Pct: 2.3 % (ref 0.4–3.1)

## 2014-07-14 LAB — LACTATE DEHYDROGENASE: LDH: 186 U/L (ref 94–250)

## 2014-07-14 NOTE — Telephone Encounter (Signed)
A user error has taken place: encounter opened in error, closed for administrative reasons.

## 2014-07-14 NOTE — Progress Notes (Signed)
LABS FOR CBCD,CMP,LDH,B2M,RET

## 2014-07-15 LAB — BETA 2 MICROGLOBULIN, SERUM: Beta-2 Microglobulin: 0.6 mg/L (ref 0.6–2.4)

## 2014-07-20 ENCOUNTER — Encounter (HOSPITAL_BASED_OUTPATIENT_CLINIC_OR_DEPARTMENT_OTHER): Payer: Medicare Other | Admitting: Hematology & Oncology

## 2014-07-20 ENCOUNTER — Encounter (HOSPITAL_COMMUNITY): Payer: Self-pay | Admitting: Hematology & Oncology

## 2014-07-20 VITALS — BP 133/69 | HR 75 | Temp 97.8°F | Resp 18 | Wt 198.4 lb

## 2014-07-20 DIAGNOSIS — R599 Enlarged lymph nodes, unspecified: Secondary | ICD-10-CM

## 2014-07-20 DIAGNOSIS — R590 Localized enlarged lymph nodes: Secondary | ICD-10-CM

## 2014-07-20 NOTE — Patient Instructions (Signed)
[place] at [facility] Discharge Instructions  RECOMMENDATIONS MADE BY THE CONSULTANT AND ANY TEST RESULTS WILL BE SENT TO YOUR REFERRING PHYSICIAN.  Exam and discussion by Dr. Whitney Muse.  You are doing well. Your lab work is stable. Report any new lumps, night sweats, unusual or unexplained weight loss.  Follow-up in 6 months with labs and office visit.  Thank you for choosing [place] at [facility] to provide your oncology and hematology care.  To afford each patient quality time with our provider, please arrive at least 15 minutes before your scheduled appointment time.    You need to re-schedule your appointment should you arrive 10 or more minutes late.  We strive to give you quality time with our providers, and arriving late affects you and other patients whose appointments are after yours.  Also, if you no show three or more times for appointments you may be dismissed from the clinic at the providers discretion.     Again, thank you for choosing [facility].  Our hope is that these requests will decrease the amount of time that you wait before being seen by our physicians.       _____________________________________________________________  Should you have questions after your visit to [facility], please contact our office at [phone] between the hours of 8:30 a.m. and 4:30 p.m.  Voicemails left after 4:30 p.m. will not be returned until the following business day.  For prescription refill requests, have your pharmacy contact our office.

## 2014-07-20 NOTE — Assessment & Plan Note (Signed)
Pleasant 74 year old female who presented with left cervical adenopathy. Biopsy showed an atypical lymphoid proliferation but flow cytometry was negative. She has no other evidence of adenopathy on exam today; she has no B symptoms. She is overall doing well. Her husband was diagnosed with lung cancer last year and unfortunately it is now stage IV. She says currently that is her biggest struggle. She would like to continue observation therefore I will reschedule her for 6 months. If she develops any adenopathy or significant change in symptoms I have advised her to call and we would see her back sooner.

## 2014-07-20 NOTE — Progress Notes (Signed)
[name],[name], MD No address on file    DIAGNOSIS: Left cervical adenopathy, biopsy with atypical lymphoid proliferation suspicious for NHL, BCL-2 negative, PCR for immunoglobulin heavy chain gene negative. Flow cytometry negative.  PET/CT 07/20/2012 with no convincing evidence of active lymphoma in the neck, chest, abdomen or pelvis.   CURRENT THERAPY: Observation  INTERVAL HISTORY: Jasmin Allen 74 y.o. female returns for follow-up of left cervical adenopathy. She has no night sweats fevers or chills. Her appetite is good. Her energy is good. She is currently taking care of her husband who has stage IV lung cancer. She finds that quite fatiguing. She also has a lot of anxiety because of his illness.  MEDICAL HISTORY: Past Medical History  Diagnosis Date  . Hypertension   . Hyperlipemia   . Heart murmur 2012    echo  . Wears glasses   . Arthritis   . PONV (postoperative nausea and vomiting)   . Cervical lymphadenopathy   . H/O bronchitis 07/2013    has Lymphadenopathy of left cervical region on her problem list.     is allergic to penicillins and sulfa antibiotics.  Ms. Screws does not currently have medications on file.  SURGICAL HISTORY: Past Surgical History  Procedure Laterality Date  . Carpal tunnel release  1985    rt hand  . Abdominal hysterectomy  1985  . Hammer toe surgery  10/01/2011    left toe  . Tonsillectomy    . Dilation and curettage of uterus      several  . Tubal ligation    . Lymph node biopsy  06/14/2012    Procedure: LYMPH NODE BIOPSY;  Surgeon: Haywood Lasso, MD;  Location: [place];  Service: General;  Laterality: Left;  remove left neck lymph node  . Cholecystectomy  01/03/2013    Procedure: LAPAROSCOPIC CHOLECYSTECTOMY attempted cholangiogram;  Surgeon: Ralene Ok, MD;  Location: [place];  Service: General;;    SOCIAL HISTORY: History   Social History  . Marital Status: Married    Spouse Name: N/A  .  Number of Children: N/A  . Years of Education: N/A   Occupational History  . Not on file.   Social History Main Topics  . Smoking status: Never Smoker   . Smokeless tobacco: Never Used  . Alcohol Use: Yes     Comment: occ  . Drug Use: No  . Sexual Activity: Not on file   Other Topics Concern  . Not on file   Social History Narrative    FAMILY HISTORY: Family History  Problem Relation Age of Onset  . Hypertension Mother   . COPD Father   . Stroke Maternal Grandfather     Review of Systems  Constitutional: Negative for fever, chills, weight loss and malaise/fatigue.  HENT: Negative for congestion, hearing loss, nosebleeds, sore throat and tinnitus.   Eyes: Negative for blurred vision, double vision, pain and discharge.  Respiratory: Negative for cough, hemoptysis, sputum production, shortness of breath and wheezing.   Cardiovascular: Negative for chest pain, palpitations, claudication, leg swelling and PND.  Gastrointestinal: Negative for heartburn, nausea, vomiting, abdominal pain, diarrhea, constipation, blood in stool and melena.  Genitourinary: Negative for dysuria, urgency, frequency and hematuria.  Musculoskeletal: Negative for myalgias, joint pain and falls.  Skin: Negative for itching and rash.  Neurological: Negative for dizziness, tingling, tremors, sensory change, speech change, focal weakness, seizures, loss of consciousness, weakness and headaches.  Endo/Heme/Allergies: Does not bruise/bleed easily.  Psychiatric/Behavioral: Negative for  depression, suicidal ideas, memory loss and substance abuse. The patient is nervous/anxious. The patient does not have insomnia.        Husband has stage IV lung cancer     PHYSICAL EXAMINATION  ECOG PERFORMANCE STATUS: 0 - Asymptomatic  Filed Vitals:   07/20/14 1300  BP: 133/69  Pulse: 75  Temp: 97.8 F (36.6 C)  Resp: 18    Physical Exam  Constitutional: She is oriented to person, place, and time and  well-developed, well-nourished, and in no distress.  HENT:  Head: Normocephalic and atraumatic.  Nose: Nose normal.  Mouth/Throat: Oropharynx is clear and moist. No oropharyngeal exudate.  Eyes: Conjunctivae and EOM are normal. Pupils are equal, round, and reactive to light. Right eye exhibits no discharge. Left eye exhibits no discharge. No scleral icterus.  Neck: Normal range of motion. Neck supple. No tracheal deviation present. No thyromegaly present.  Cardiovascular: Normal rate and regular rhythm.  Exam reveals no gallop and no friction rub.   Murmur heard. Pulmonary/Chest: Effort normal and breath sounds normal. She has no wheezes. She has no rales.  Abdominal: Soft. Bowel sounds are normal. She exhibits no distension and no mass. There is no tenderness. There is no rebound and no guarding.  Musculoskeletal: Normal range of motion. She exhibits no edema.  Lymphadenopathy:       Head (right side): No submental and no submandibular adenopathy present.       Head (left side): No submental and no submandibular adenopathy present.    She has no cervical adenopathy.       Right cervical: No superficial cervical, no deep cervical and no posterior cervical adenopathy present.      Left cervical: No superficial cervical, no deep cervical and no posterior cervical adenopathy present.       Right axillary: No pectoral and no lateral adenopathy present.       Left axillary: No pectoral and no lateral adenopathy present.      Right: No supraclavicular adenopathy present.       Left: No supraclavicular adenopathy present.  Neurological: She is alert and oriented to person, place, and time. She has normal reflexes. No cranial nerve deficit. Gait normal. Coordination normal.  Skin: Skin is warm and dry. No rash noted.  Psychiatric: Mood, memory, affect and judgment normal.  Nursing note and vitals reviewed.   LABORATORY DATA:  CBC    Component Value Date/Time   WBC 4.5 07/14/2014 1003   RBC  3.66* 07/14/2014 1003   RBC 3.63* 07/14/2014 1003   HGB 11.9* 07/14/2014 1003   HCT 34.9* 07/14/2014 1003   PLT 172 07/14/2014 1003   MCV 95.4 07/14/2014 1003   MCH 32.5 07/14/2014 1003   MCHC 34.1 07/14/2014 1003   RDW 13.5 07/14/2014 1003   LYMPHSABS 1.3 07/14/2014 1003   MONOABS 0.3 07/14/2014 1003   EOSABS 0.0 07/14/2014 1003   BASOSABS 0.0 07/14/2014 1003   CMP     Component Value Date/Time   NA 140 07/14/2014 1003   K 3.8 07/14/2014 1003   CL 106 07/14/2014 1003   CO2 29 07/14/2014 1003   GLUCOSE 110* 07/14/2014 1003   BUN 17 07/14/2014 1003   CREATININE 0.99 07/14/2014 1003   CALCIUM 9.0 07/14/2014 1003   PROT 6.7 07/14/2014 1003   ALBUMIN 4.2 07/14/2014 1003   AST 26 07/14/2014 1003   ALT 23 07/14/2014 1003   ALKPHOS 89 07/14/2014 1003   BILITOT 0.9 07/14/2014 1003   GFRNONAA 56* 07/14/2014 1003  GFRAA 64* 07/14/2014 1003       ASSESSMENT and THERAPY PLAN:    Lymphadenopathy of left cervical region Pleasant 74 year old female who presented with left cervical adenopathy. Biopsy showed an atypical lymphoid proliferation but flow cytometry was negative. She has no other evidence of adenopathy on exam today; she has no B symptoms. She is overall doing well. Her husband was diagnosed with lung cancer last year and unfortunately it is now stage IV. She says currently that is her biggest struggle. She would like to continue observation therefore I will reschedule her for 6 months. If she develops any adenopathy or significant change in symptoms I have advised her to call and we would see her back sooner.     All questions were answered. The patient knows to call the clinic with any problems, questions or concerns. We can certainly see the patient much sooner if necessary. This note was electronically signed. Molli Hazard 07/20/2014

## 2015-01-17 ENCOUNTER — Other Ambulatory Visit (HOSPITAL_COMMUNITY): Payer: Medicare Other

## 2015-01-19 ENCOUNTER — Ambulatory Visit (HOSPITAL_COMMUNITY): Payer: Medicare Other | Admitting: Hematology & Oncology

## 2015-01-19 ENCOUNTER — Other Ambulatory Visit (HOSPITAL_COMMUNITY): Payer: Medicare Other

## 2015-02-13 ENCOUNTER — Ambulatory Visit (HOSPITAL_COMMUNITY): Payer: Medicare Other | Admitting: Hematology & Oncology

## 2015-02-15 ENCOUNTER — Ambulatory Visit (HOSPITAL_COMMUNITY): Payer: Medicare Other | Admitting: Hematology & Oncology

## 2015-02-15 ENCOUNTER — Other Ambulatory Visit (HOSPITAL_COMMUNITY): Payer: Medicare Other

## 2015-02-22 ENCOUNTER — Encounter (HOSPITAL_COMMUNITY): Payer: Self-pay | Admitting: Hematology & Oncology

## 2015-02-22 ENCOUNTER — Encounter (HOSPITAL_COMMUNITY): Payer: Medicare Other | Attending: Hematology & Oncology | Admitting: Hematology & Oncology

## 2015-02-22 ENCOUNTER — Encounter (HOSPITAL_BASED_OUTPATIENT_CLINIC_OR_DEPARTMENT_OTHER): Payer: Medicare Other

## 2015-02-22 VITALS — BP 121/67 | HR 67 | Temp 98.4°F | Resp 16 | Wt 193.8 lb

## 2015-02-22 DIAGNOSIS — R599 Enlarged lymph nodes, unspecified: Secondary | ICD-10-CM

## 2015-02-22 DIAGNOSIS — R590 Localized enlarged lymph nodes: Secondary | ICD-10-CM

## 2015-02-22 LAB — COMPREHENSIVE METABOLIC PANEL
ALBUMIN: 4.1 g/dL (ref 3.5–5.0)
ALK PHOS: 74 U/L (ref 38–126)
ALT: 18 U/L (ref 14–54)
ANION GAP: 7 (ref 5–15)
AST: 20 U/L (ref 15–41)
BUN: 11 mg/dL (ref 6–20)
CALCIUM: 8.9 mg/dL (ref 8.9–10.3)
CO2: 29 mmol/L (ref 22–32)
Chloride: 106 mmol/L (ref 101–111)
Creatinine, Ser: 0.8 mg/dL (ref 0.44–1.00)
GFR calc non Af Amer: >60 mL/min (ref >60)
Glucose, Bld: 103 mg/dL — ABNORMAL HIGH (ref 65–99)
POTASSIUM: 4.3 mmol/L (ref 3.5–5.1)
SODIUM: 142 mmol/L (ref 135–145)
Total Bilirubin: 1.2 mg/dL (ref 0.3–1.2)
Total Protein: 6.5 g/dL (ref 6.5–8.1)

## 2015-02-22 LAB — CBC WITH DIFFERENTIAL/PLATELET
BASOS PCT: 0 %
Basophils Absolute: 0 10*3/uL (ref 0.0–0.1)
EOS PCT: 0 %
Eosinophils Absolute: 0 10*3/uL (ref 0.0–0.7)
HCT: 34.2 % — ABNORMAL LOW (ref 36.0–46.0)
HEMOGLOBIN: 11.9 g/dL — AB (ref 12.0–15.0)
Lymphocytes Relative: 34 %
Lymphs Abs: 1.5 10*3/uL (ref 0.7–4.0)
MCH: 33.3 pg (ref 26.0–34.0)
MCHC: 34.8 g/dL (ref 30.0–36.0)
MCV: 95.8 fL (ref 78.0–100.0)
MONO ABS: 0.3 10*3/uL (ref 0.1–1.0)
MONOS PCT: 6 %
NEUTROS PCT: 60 %
Neutro Abs: 2.6 10*3/uL (ref 1.7–7.7)
PLATELETS: 163 10*3/uL (ref 150–400)
RBC: 3.57 MIL/uL — ABNORMAL LOW (ref 3.87–5.11)
RDW: 13.2 % (ref 11.5–15.5)
WBC: 4.4 10*3/uL (ref 4.0–10.5)

## 2015-02-22 LAB — LACTATE DEHYDROGENASE: LDH: 195 U/L — ABNORMAL HIGH (ref 98–192)

## 2015-02-22 NOTE — Patient Instructions (Signed)
[place] at [facility] Discharge Instructions  RECOMMENDATIONS MADE BY THE CONSULTANT AND ANY TEST RESULTS WILL BE SENT TO YOUR REFERRING PHYSICIAN.  Exam and discussion by Dr. Whitney Muse. Report any new lumps, unexplained weight loss, night sweats, etc.  Labs and office visit in 6 months.  Thank you for choosing [place] at [facility] to provide your oncology and hematology care.  To afford each patient quality time with our provider, please arrive at least 15 minutes before your scheduled appointment time.    You need to re-schedule your appointment should you arrive 10 or more minutes late.  We strive to give you quality time with our providers, and arriving late affects you and other patients whose appointments are after yours.  Also, if you no show three or more times for appointments you may be dismissed from the clinic at the providers discretion.     Again, thank you for choosing [facility].  Our hope is that these requests will decrease the amount of time that you wait before being seen by our physicians.       _____________________________________________________________  Should you have questions after your visit to [facility], please contact our office at [phone] between the hours of 8:30 a.m. and 4:30 p.m.  Voicemails left after 4:30 p.m. will not be returned until the following business day.  For prescription refill requests, have your pharmacy contact our office.

## 2015-02-22 NOTE — Progress Notes (Signed)
[name],[name], MD No address on file  PROGRESS NOTE  DIAGNOSIS: Left cervical adenopathy, biopsy with atypical lymphoid proliferation suspicious for NHL, BCL-2 negative, PCR for immunoglobulin heavy chain gene negative. Flow cytometry negative.  PET/CT 07/20/2012 with no convincing evidence of active lymphoma in the neck, chest, abdomen or pelvis.   CURRENT THERAPY: Observation  INTERVAL HISTORY: Jasmin Allen 74 y.o. female returns for follow-up of left cervical adenopathy. She has no night sweats fevers or chills. Her appetite is good.   She is currently dealing with grief from the passing of her husband.  Otherwise, she states that she is feeling well.  Denies drenching night sweats.  Her appetite is well. She is up to date on her screenings.  She has had a partial hysterectomy due to migraines and bleeding while pre-menopausal.  The patient notes that she does do self breast examinations.  She has no concerns at this time.   MEDICAL HISTORY: Past Medical History  Diagnosis Date  . Hypertension   . Hyperlipemia   . Heart murmur 2012    echo  . Wears glasses   . Arthritis   . PONV (postoperative nausea and vomiting)   . Cervical lymphadenopathy   . H/O bronchitis 07/2013    has Lymphadenopathy of left cervical region on her problem list.     is allergic to penicillins and sulfa antibiotics.  [name] does not currently have medications on file.  SURGICAL HISTORY: Past Surgical History  Procedure Laterality Date  . Carpal tunnel release  1985    rt hand  . Abdominal hysterectomy  1985  . Hammer toe surgery  10/01/2011    left toe  . Tonsillectomy    . Dilation and curettage of uterus      several  . Tubal ligation    . Lymph node biopsy  06/14/2012    Procedure: LYMPH NODE BIOPSY;  Surgeon: Jasmin Lasso, MD;  Location: [place];  Service: General;  Laterality: Left;  remove left neck lymph node  . Cholecystectomy  01/03/2013   Procedure: LAPAROSCOPIC CHOLECYSTECTOMY attempted cholangiogram;  Surgeon: Ralene Ok, MD;  Location: [place];  Service: General;;    SOCIAL HISTORY: Social History   Social History  . Marital Status: Married    Spouse Name: N/A  . Number of Children: N/A  . Years of Education: N/A   Occupational History  . Not on file.   Social History Main Topics  . Smoking status: Never Smoker   . Smokeless tobacco: Never Used  . Alcohol Use: Yes     Comment: occ  . Drug Use: No  . Sexual Activity: Not on file   Other Topics Concern  . Not on file   Social History Narrative    FAMILY HISTORY: Family History  Problem Relation Age of Onset  . Hypertension Mother   . COPD Father   . Stroke Maternal Grandfather     Review of Systems  Constitutional: Negative for fever, chills, weight loss and malaise/fatigue.  HENT: Negative for congestion, hearing loss, nosebleeds, sore throat and tinnitus.   Eyes: Negative for blurred vision, double vision, pain and discharge.  Respiratory: Negative for cough, hemoptysis, sputum production, shortness of breath and wheezing.   Cardiovascular: Negative for chest pain, palpitations, claudication, leg swelling and PND.  Gastrointestinal: Negative for heartburn, nausea, vomiting, abdominal pain, diarrhea, constipation, blood in stool and melena.  Genitourinary: Negative for dysuria, urgency, frequency and hematuria.  Musculoskeletal: Negative for myalgias,  joint pain and falls.  Skin: Negative for itching and rash.  Neurological: Negative for dizziness, tingling, tremors, sensory change, speech change, focal weakness, seizures, loss of consciousness, weakness and headaches.  Endo/Heme/Allergies: Does not bruise/bleed easily.  Psychiatric/Behavioral: Negative for depression, suicidal ideas, memory loss and substance abuse, nervous/anxious.  The patient does not have insomnia.        Husband has deceased 62 point review of systems was performed and is  negative except as detailed under history of present illness and above   PHYSICAL EXAMINATION  ECOG PERFORMANCE STATUS: 0 - Asymptomatic  Filed Vitals:   02/22/15 1239  BP: 121/67  Pulse: 67  Temp: 98.4 F (36.9 C)  Resp: 16    Physical Exam  Constitutional: She is oriented to person, place, and time and well-developed, well-nourished, and in no distress.  All teeth present HENT:  Head: Normocephalic and atraumatic.  Nose: Nose normal.  Mouth/Throat: Oropharynx is clear and moist. No oropharyngeal exudate.  Eyes: Conjunctivae and EOM are normal. Pupils are equal, round, and reactive to light. Right eye exhibits no discharge. Left eye exhibits no discharge. No scleral icterus.  Neck: Normal range of motion. Neck supple. No tracheal deviation present. No thyromegaly present.  Cardiovascular: Normal rate and regular rhythm.  Exam reveals no gallop and no friction rub.   Murmur heard. Pulmonary/Chest: Effort normal and breath sounds normal. She has no wheezes. She has no rales.  Abdominal: Soft. Bowel sounds are normal. She exhibits no distension and no mass. There is no tenderness. There is no rebound and no guarding.  Musculoskeletal: Normal range of motion. She exhibits no edema.  Lymphadenopathy:       Head (right side): No submental and no submandibular adenopathy present.       Head (left side): No submental and no submandibular adenopathy present.    She has no cervical adenopathy.       Right cervical: No superficial cervical, no deep cervical and no posterior cervical adenopathy present.      Left cervical: No superficial cervical, no deep cervical and no posterior cervical adenopathy present.       Right axillary: No pectoral and no lateral adenopathy present.       Left axillary: No pectoral and no lateral adenopathy present.      Right: No supraclavicular adenopathy present.       Left: No supraclavicular adenopathy present.  Neurological: She is alert and oriented to  person, place, and time. She has normal reflexes. No cranial nerve deficit. Gait normal. Coordination normal.  Skin: Skin is warm and dry. No rash noted.  Psychiatric: Mood, memory, affect and judgment normal.  Nursing note and vitals reviewed. Breast exam:  Dense breast tissue  LABORATORY DATA: I have reviewed the data below as listed.   CBC    Component Value Date/Time   WBC 4.4 02/22/2015 1253   RBC 3.57* 02/22/2015 1253   RBC 3.63* 07/14/2014 1003   HGB 11.9* 02/22/2015 1253   HCT 34.2* 02/22/2015 1253   PLT 163 02/22/2015 1253   MCV 95.8 02/22/2015 1253   MCH 33.3 02/22/2015 1253   MCHC 34.8 02/22/2015 1253   RDW 13.2 02/22/2015 1253   LYMPHSABS 1.5 02/22/2015 1253   MONOABS 0.3 02/22/2015 1253   EOSABS 0.0 02/22/2015 1253   BASOSABS 0.0 02/22/2015 1253   CMP     Component Value Date/Time   NA 142 02/22/2015 1253   K 4.3 02/22/2015 1253   CL 106 02/22/2015 1253  CO2 29 02/22/2015 1253   GLUCOSE 103* 02/22/2015 1253   BUN 11 02/22/2015 1253   CREATININE 0.80 02/22/2015 1253   CALCIUM 8.9 02/22/2015 1253   PROT 6.5 02/22/2015 1253   ALBUMIN 4.1 02/22/2015 1253   AST 20 02/22/2015 1253   ALT 18 02/22/2015 1253   ALKPHOS 74 02/22/2015 1253   BILITOT 1.2 02/22/2015 1253   GFRNONAA >60 02/22/2015 1253   GFRAA >60 02/22/2015 1253     ASSESSMENT and THERAPY PLAN:  History of cervical lymphadenopathy Biopsy showing 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 Her exam is unremarkable. She has no B symptoms. She is overall doing quite well. Her husband has passed away from lung cancer. She is dealing with this fairly well. She has a good support system and seems to be moving forward. We will bring her back in 6 months. We will continue to observe her blood counts.  Orders Placed This Encounter  Procedures  . CBC with Differential    Standing Status: Future      Number of Occurrences:      Standing Expiration Date: 02/22/2016  . Comprehensive metabolic panel    Standing Status: Future     Number of Occurrences:      Standing Expiration Date: 02/22/2016  . Lactate dehydrogenase    Standing Status: Future     Number of Occurrences:      Standing Expiration Date: 02/22/2016    All questions were answered. The patient knows to call the clinic with any problems, questions or concerns. We can certainly see the patient much sooner if necessary.   This document serves as a record of services personally performed by Ancil Linsey, MD. It was created on her behalf by Janace Hoard, a trained medical scribe. The creation of this record is based on the scribe's personal observations and the provider's statements to them. This document has been checked and approved by the attending provider.  I have reviewed the above documentation for accuracy and completeness, and I agree with the above.  This note was electronically signed.  Kelby Fam. Whitney Muse, MD

## 2015-02-23 NOTE — Progress Notes (Signed)
Labs drawn

## 2015-08-22 ENCOUNTER — Encounter (HOSPITAL_COMMUNITY): Payer: Medicare Other

## 2015-08-22 ENCOUNTER — Encounter (HOSPITAL_COMMUNITY): Payer: Medicare Other | Attending: Hematology & Oncology | Admitting: Hematology & Oncology

## 2015-08-22 ENCOUNTER — Encounter (HOSPITAL_COMMUNITY): Payer: Self-pay | Admitting: Hematology & Oncology

## 2015-08-22 VITALS — BP 116/58 | HR 66 | Temp 97.9°F | Resp 18 | Wt 186.7 lb

## 2015-08-22 DIAGNOSIS — R590 Localized enlarged lymph nodes: Secondary | ICD-10-CM

## 2015-08-22 DIAGNOSIS — R599 Enlarged lymph nodes, unspecified: Secondary | ICD-10-CM | POA: Diagnosis present

## 2015-08-22 LAB — COMPREHENSIVE METABOLIC PANEL
ALBUMIN: 4.3 g/dL (ref 3.5–5.0)
ALT: 17 U/L (ref 14–54)
AST: 18 U/L (ref 15–41)
Alkaline Phosphatase: 72 U/L (ref 38–126)
Anion gap: 7 (ref 5–15)
BUN: 14 mg/dL (ref 6–20)
CHLORIDE: 103 mmol/L (ref 101–111)
CO2: 31 mmol/L (ref 22–32)
CREATININE: 0.76 mg/dL (ref 0.44–1.00)
Calcium: 9.2 mg/dL (ref 8.9–10.3)
GFR calc Af Amer: >60 mL/min (ref >60)
GFR calc non Af Amer: >60 mL/min (ref >60)
Glucose, Bld: 107 mg/dL — ABNORMAL HIGH (ref 65–99)
Potassium: 4.1 mmol/L (ref 3.5–5.1)
SODIUM: 141 mmol/L (ref 135–145)
Total Bilirubin: 1 mg/dL (ref 0.3–1.2)
Total Protein: 6.8 g/dL (ref 6.5–8.1)

## 2015-08-22 LAB — CBC WITH DIFFERENTIAL/PLATELET
BASOS ABS: 0 10*3/uL (ref 0.0–0.1)
BASOS PCT: 0 %
EOS ABS: 0 10*3/uL (ref 0.0–0.7)
EOS PCT: 0 %
HCT: 36.4 % (ref 36.0–46.0)
Hemoglobin: 12.9 g/dL (ref 12.0–15.0)
Lymphocytes Relative: 37 %
Lymphs Abs: 1.8 10*3/uL (ref 0.7–4.0)
MCH: 33.8 pg (ref 26.0–34.0)
MCHC: 35.4 g/dL (ref 30.0–36.0)
MCV: 95.3 fL (ref 78.0–100.0)
Monocytes Absolute: 0.3 10*3/uL (ref 0.1–1.0)
Monocytes Relative: 7 %
Neutro Abs: 2.7 10*3/uL (ref 1.7–7.7)
Neutrophils Relative %: 56 %
PLATELETS: 157 10*3/uL (ref 150–400)
RBC: 3.82 MIL/uL — AB (ref 3.87–5.11)
RDW: 13.1 % (ref 11.5–15.5)
WBC: 4.8 10*3/uL (ref 4.0–10.5)

## 2015-08-22 LAB — LACTATE DEHYDROGENASE: LDH: 179 U/L (ref 98–192)

## 2015-08-22 NOTE — Patient Instructions (Addendum)
[place] at [facility] Discharge Instructions  RECOMMENDATIONS MADE BY THE CONSULTANT AND ANY TEST RESULTS WILL BE SENT TO YOUR REFERRING PHYSICIAN.   Exam and discussion by Dr Whitney Muse today Mammogram up to date If you have any new lumps or bumps please call us. Return to see the doctor in 6 months with labs Please call the clinic if you have any questions or concerns     Thank you for choosing [place] at [facility] to provide your oncology and hematology care.  To afford each patient quality time with our provider, please arrive at least 15 minutes before your scheduled appointment time.   Beginning January 23rd 2017 lab work for the [facility] will be done in the  Main lab at [facility] on 1st floor. If you have a lab appointment with the [facility] please come in thru the  Main Entrance and check in at the main information desk  You need to re-schedule your appointment should you arrive 10 or more minutes late.  We strive to give you quality time with our providers, and arriving late affects you and other patients whose appointments are after yours.  Also, if you no show three or more times for appointments you may be dismissed from the clinic at the providers discretion.     Again, thank you for choosing [facility].  Our hope is that these requests will decrease the amount of time that you wait before being seen by our physicians.       _____________________________________________________________  Should you have questions after your visit to [facility], please contact our office at [phone] between the hours of 8:30 a.m. and 4:30 p.m.  Voicemails left after 4:30 p.m. will not be returned until the following business day.  For prescription refill requests, have your pharmacy contact our office.         Resources For Cancer Patients and their Caregivers ? American Cancer Society: Can  assist with transportation, wigs, general needs, runs Look Good Feel Better.        903-638-1732 ? Cancer Care: Provides financial assistance, online support groups, medication/co-pay assistance.  1-800-813-HOPE 713 546 8009) ? [place] Assists [place] cancer patients and their families through emotional , educational and financial support.  (320)368-9806 ? [place] DSS Where to apply for food stamps, Medicaid and utility assistance. 5738425567 ? [facility]: Transportation to medical appointments. 330-348-8156 ? Social Security Administration: May apply for disability if have a Stage IV cancer. 409-772-2908 231-271-1082 ? [employer], Disability and Transit Services: Assists with nutrition, care and transit needs. 720 241 0428

## 2015-08-22 NOTE — Progress Notes (Signed)
[name],[name], MD No address on file  PROGRESS NOTE  DIAGNOSIS: Left cervical adenopathy, biopsy with atypical lymphoid proliferation suspicious for NHL, BCL-2 negative, PCR for immunoglobulin heavy chain gene negative. Flow cytometry negative.  PET/CT 07/20/2012 with no convincing evidence of active lymphoma in the neck, chest, abdomen or pelvis.   CURRENT THERAPY: Observation  INTERVAL HISTORY: Jasmin Allen 75 y.o. female returns for follow-up of left cervical adenopathy. She has no night sweats fevers or chills. Her appetite is good.   Jasmin Allen was here alone today.  She has been doing well. She has been trying to clean out her house because it is on the market and this has been keeping her very busy. She got a new house 3 miles away that is smaller and only 1 level.   She has lost 7 pounds since her last visit on 02/22/15. This has not been intentional but she feels is due to her cleaning out her house. She has a fit bit. One day she said that she was on her feet all day and by the end she had walked 4 miles.   She said that Valentine's day was tough because of the recent passing of her husband.   Has not been sick or had the flu.  She had a mammogram on 08/01/15.  She has a good appetite and her energy is good.  She denies any new lumps or bumps, belly pain or night sweats.  She is not concerned or worried about anything right now.   MEDICAL HISTORY: Past Medical History  Diagnosis Date  . Hypertension   . Hyperlipemia   . Heart murmur 2012    echo  . Wears glasses   . Arthritis   . PONV (postoperative nausea and vomiting)   . Cervical lymphadenopathy   . H/O bronchitis 07/2013    has Lymphadenopathy of left cervical region on her problem list.     is allergic to penicillins and sulfa antibiotics.  Jasmin Allen does not currently have medications on file.  SURGICAL HISTORY: Past Surgical History  Procedure Laterality Date  . Carpal tunnel  release  1985    rt hand  . Abdominal hysterectomy  1985  . Hammer toe surgery  10/01/2011    left toe  . Tonsillectomy    . Dilation and curettage of uterus      several  . Tubal ligation    . Lymph node biopsy  06/14/2012    Procedure: LYMPH NODE BIOPSY;  Surgeon: Haywood Lasso, MD;  Location: [facility];  Service: General;  Laterality: Left;  remove left neck lymph node  . Cholecystectomy  01/03/2013    Procedure: LAPAROSCOPIC CHOLECYSTECTOMY attempted cholangiogram;  Surgeon: Ralene Ok, MD;  Location: [facility];  Service: General;;    SOCIAL HISTORY: Social History   Social History  . Marital Status: Married    Spouse Name: N/A  . Number of Children: N/A  . Years of Education: N/A   Occupational History  . Not on file.   Social History Main Topics  . Smoking status: Never Smoker   . Smokeless tobacco: Never Used  . Alcohol Use: Yes     Comment: occ  . Drug Use: No  . Sexual Activity: Not on file   Other Topics Concern  . Not on file   Social History Narrative    FAMILY HISTORY: Family History  Problem Relation Age of Onset  . Hypertension Mother   . COPD  Father   . Stroke Maternal Grandfather     Review of Systems  Constitutional: Negative for fever, chills, weight loss and malaise/fatigue.  HENT: Negative for congestion, hearing loss, nosebleeds, sore throat and tinnitus.   Eyes: Negative for blurred vision, double vision, pain and discharge.  Respiratory: Negative for cough, hemoptysis, sputum production, shortness of breath and wheezing.   Cardiovascular: Negative for chest pain, palpitations, claudication, leg swelling and PND.  Gastrointestinal: Negative for heartburn, nausea, vomiting, abdominal pain, diarrhea, constipation, blood in stool and melena.  Genitourinary: Negative for dysuria, urgency, frequency and hematuria.  Musculoskeletal: Negative for myalgias, joint pain and falls.  Skin: Negative for itching and rash.    Neurological: Negative for dizziness, tingling, tremors, sensory change, speech change, focal weakness, seizures, loss of consciousness, weakness and headaches.  Endo/Heme/Allergies: Does not bruise/bleed easily.  Psychiatric/Behavioral: Negative for depression, suicidal ideas, memory loss and substance abuse, nervous/anxious.  The patient does not have insomnia.        Husband is deceased 35 point review of systems was performed and is negative except as detailed under history of present illness and above   PHYSICAL EXAMINATION  ECOG PERFORMANCE STATUS: 0 - Asymptomatic  Filed Vitals:   08/22/15 1258  BP: 116/58  Pulse: 66  Temp: 97.9 F (36.6 C)  Resp: 18    Physical Exam  Constitutional: She is oriented to person, place, and time and well-developed, well-nourished, and in no distress.  All teeth present HENT:  Head: Normocephalic and atraumatic.  Nose: Nose normal.  Mouth/Throat: Oropharynx is clear and moist. No oropharyngeal exudate.  Eyes: Conjunctivae and EOM are normal. Pupils are equal, round, and reactive to light. Right eye exhibits no discharge. Left eye exhibits no discharge. No scleral icterus.  Neck: Normal range of motion. Neck supple. No tracheal deviation present. No thyromegaly present.  Cardiovascular: Normal rate and regular rhythm.  Exam reveals no gallop and no friction rub.   Murmur heard. Pulmonary/Chest: Effort normal and breath sounds normal. She has no wheezes. She has no rales.  Abdominal: Soft. Bowel sounds are normal. She exhibits no distension and no mass. There is no tenderness. There is no rebound and no guarding.  Musculoskeletal: Normal range of motion. She exhibits no edema.  Lymphadenopathy:       Head (right side): No submental and no submandibular adenopathy present.       Head (left side): No submental and no submandibular adenopathy present.    She has no cervical adenopathy.       Right cervical: No superficial cervical, no deep  cervical and no posterior cervical adenopathy present.      Left cervical: No superficial cervical, no deep cervical and no posterior cervical adenopathy present.       Right axillary: No pectoral and no lateral adenopathy present.       Left axillary: No pectoral and no lateral adenopathy present.      Right: No supraclavicular adenopathy present.       Left: No supraclavicular adenopathy present.  Neurological: She is alert and oriented to person, place, and time. She has normal reflexes. No cranial nerve deficit. Gait normal. Coordination normal.  Skin: Skin is warm and dry. No rash noted.  Psychiatric: Mood, memory, affect and judgment normal.  Nursing note and vitals reviewed. Breast exam:  Dense breast tissue, no palpable masses no suspicious skin changes, no nipple retraction   LABORATORY DATA: I have reviewed the data below as listed.   CBC  Component Value Date/Time   WBC 4.8 08/22/2015 1220   RBC 3.82* 08/22/2015 1220   RBC 3.63* 07/14/2014 1003   HGB 12.9 08/22/2015 1220   HCT 36.4 08/22/2015 1220   PLT 157 08/22/2015 1220   MCV 95.3 08/22/2015 1220   MCH 33.8 08/22/2015 1220   MCHC 35.4 08/22/2015 1220   RDW 13.1 08/22/2015 1220   LYMPHSABS 1.8 08/22/2015 1220   MONOABS 0.3 08/22/2015 1220   EOSABS 0.0 08/22/2015 1220   BASOSABS 0.0 08/22/2015 1220   CMP     Component Value Date/Time   NA 141 08/22/2015 1220   K 4.1 08/22/2015 1220   CL 103 08/22/2015 1220   CO2 31 08/22/2015 1220   GLUCOSE 107* 08/22/2015 1220   BUN 14 08/22/2015 1220   CREATININE 0.76 08/22/2015 1220   CALCIUM 9.2 08/22/2015 1220   PROT 6.8 08/22/2015 1220   ALBUMIN 4.3 08/22/2015 1220   AST 18 08/22/2015 1220   ALT 17 08/22/2015 1220   ALKPHOS 72 08/22/2015 1220   BILITOT 1.0 08/22/2015 1220   GFRNONAA >60 08/22/2015 1220   GFRAA >60 08/22/2015 1220   ASSESSMENT and THERAPY PLAN:  History of cervical lymphadenopathy Biopsy showing atypical lymphoid proliferation suspicious for  non-Hodgkin's lymphoma, flow cytometry unremarkable Mild anemia  I recommended ongoing observation. Her exam is unremarkable. She has no B symptoms. She is overall doing quite well. Her husband has passed away from lung cancer. She is dealing with this fairly well. She has a good support system and seems to be moving forward. We will bring her back in 6 months. We will continue to observe her blood counts.  Orders Placed This Encounter  Procedures  . CBC with Differential    Standing Status: Future     Number of Occurrences:      Standing Expiration Date: 08/21/2016  . Comprehensive metabolic panel    Standing Status: Future     Number of Occurrences:      Standing Expiration Date: 08/21/2016  . Lactate dehydrogenase    Standing Status: Future     Number of Occurrences:      Standing Expiration Date: 08/21/2016   She had a mammogram done on 08/01/15. She notes that she is up to date with other well care.   All questions were answered. The patient knows to call the clinic with any problems, questions or concerns. We can certainly see the patient much sooner if necessary.   This document serves as a record of services personally performed by Ancil Linsey, MD. It was created on her behalf by Kandace Blitz, a trained medical scribe. The creation of this record is based on the scribe's personal observations and the provider's statements to them. This document has been checked and approved by the attending provider.  I have reviewed the above documentation for accuracy and completeness, and I agree with the above.  This note was electronically signed.  Kelby Fam. Whitney Muse, MD

## 2015-10-03 ENCOUNTER — Encounter (HOSPITAL_COMMUNITY): Payer: Self-pay | Admitting: Hematology & Oncology

## 2016-02-22 ENCOUNTER — Encounter (HOSPITAL_COMMUNITY): Payer: Medicare Other

## 2016-02-22 ENCOUNTER — Encounter (HOSPITAL_COMMUNITY): Payer: Self-pay | Admitting: Hematology & Oncology

## 2016-02-22 ENCOUNTER — Encounter (HOSPITAL_COMMUNITY): Payer: Medicare Other | Attending: Hematology & Oncology | Admitting: Hematology & Oncology

## 2016-02-22 VITALS — BP 143/61 | HR 62 | Temp 97.7°F | Resp 18 | Wt 187.8 lb

## 2016-02-22 DIAGNOSIS — Z882 Allergy status to sulfonamides status: Secondary | ICD-10-CM | POA: Insufficient documentation

## 2016-02-22 DIAGNOSIS — R590 Localized enlarged lymph nodes: Secondary | ICD-10-CM | POA: Diagnosis present

## 2016-02-22 DIAGNOSIS — I10 Essential (primary) hypertension: Secondary | ICD-10-CM | POA: Insufficient documentation

## 2016-02-22 DIAGNOSIS — E785 Hyperlipidemia, unspecified: Secondary | ICD-10-CM | POA: Diagnosis not present

## 2016-02-22 DIAGNOSIS — Z880 Allergy status to penicillin: Secondary | ICD-10-CM | POA: Insufficient documentation

## 2016-02-22 DIAGNOSIS — D649 Anemia, unspecified: Secondary | ICD-10-CM | POA: Diagnosis not present

## 2016-02-22 DIAGNOSIS — D696 Thrombocytopenia, unspecified: Secondary | ICD-10-CM

## 2016-02-22 LAB — COMPREHENSIVE METABOLIC PANEL
ALT: 19 U/L (ref 14–54)
AST: 22 U/L (ref 15–41)
Albumin: 4.2 g/dL (ref 3.5–5.0)
Alkaline Phosphatase: 72 U/L (ref 38–126)
Anion gap: 4 — ABNORMAL LOW (ref 5–15)
BUN: 11 mg/dL (ref 6–20)
CHLORIDE: 105 mmol/L (ref 101–111)
CO2: 31 mmol/L (ref 22–32)
Calcium: 9.4 mg/dL (ref 8.9–10.3)
Creatinine, Ser: 0.73 mg/dL (ref 0.44–1.00)
Glucose, Bld: 100 mg/dL — ABNORMAL HIGH (ref 65–99)
POTASSIUM: 4.5 mmol/L (ref 3.5–5.1)
Sodium: 140 mmol/L (ref 135–145)
Total Bilirubin: 1.1 mg/dL (ref 0.3–1.2)
Total Protein: 6.5 g/dL (ref 6.5–8.1)

## 2016-02-22 LAB — CBC WITH DIFFERENTIAL/PLATELET
BASOS ABS: 0 10*3/uL (ref 0.0–0.1)
Basophils Relative: 0 %
EOS ABS: 0 10*3/uL (ref 0.0–0.7)
EOS PCT: 0 %
HCT: 34.5 % — ABNORMAL LOW (ref 36.0–46.0)
Hemoglobin: 11.8 g/dL — ABNORMAL LOW (ref 12.0–15.0)
LYMPHS PCT: 36 %
Lymphs Abs: 1.5 10*3/uL (ref 0.7–4.0)
MCH: 32.9 pg (ref 26.0–34.0)
MCHC: 34.2 g/dL (ref 30.0–36.0)
MCV: 96.1 fL (ref 78.0–100.0)
MONO ABS: 0.3 10*3/uL (ref 0.1–1.0)
Monocytes Relative: 7 %
Neutro Abs: 2.3 10*3/uL (ref 1.7–7.7)
Neutrophils Relative %: 57 %
PLATELETS: 140 10*3/uL — AB (ref 150–400)
RBC: 3.59 MIL/uL — ABNORMAL LOW (ref 3.87–5.11)
RDW: 13.3 % (ref 11.5–15.5)
WBC: 4 10*3/uL (ref 4.0–10.5)

## 2016-02-22 LAB — LACTATE DEHYDROGENASE: LDH: 180 U/L (ref 98–192)

## 2016-02-22 NOTE — Progress Notes (Signed)
Jasmin,[name], MD No address on file  PROGRESS NOTE  DIAGNOSIS: Left cervical adenopathy, biopsy with atypical lymphoid proliferation suspicious for NHL, BCL-2 negative, PCR for immunoglobulin heavy chain gene negative. Flow cytometry negative.  PET/CT 07/20/2012 with no convincing evidence of active lymphoma in the neck, chest, abdomen or pelvis.   CURRENT THERAPY: Observation  INTERVAL HISTORY: Jasmin Allen 75 y.o. female returns for follow-up of left cervical adenopathy. She has no night sweats fevers or chills. Her appetite is good.   Jasmin Allen is unaccompanied. I personally reviewed and went over laboratory studies with the patient.  The one year anniversary of her husband's passing was recently. She has been doing well, at times she still becomes bothered but has been reading books about how to deal with this loss.   She takes cinnamon and biotene.   She plans to receive a flu shot next month. She is scheduled to see her dentist in a couple of weeks. She has a PCP she sees regularly.  Her appetite is good. She denies night sweats or any new lumps or bumps. She denies fevers. She remains active. She sold her house and has moved.   Last mammogram in March of this year. She will have her next in March 2018. Her last colonoscopy was in 2013.   MEDICAL HISTORY: Past Medical History:  Diagnosis Date  . Arthritis   . Cervical lymphadenopathy   . H/O bronchitis 07/2013  . Heart murmur 2012   echo  . Hyperlipemia   . Hypertension   . PONV (postoperative nausea and vomiting)   . Wears glasses     has Lymphadenopathy of left cervical region on her problem list.     is allergic to penicillins and sulfa antibiotics.  Jasmin Allen does not currently have medications on file.  SURGICAL HISTORY: Past Surgical History:  Procedure Laterality Date  . ABDOMINAL HYSTERECTOMY  1985  . CARPAL TUNNEL RELEASE  1985   rt hand  . CHOLECYSTECTOMY  01/03/2013   Procedure:  LAPAROSCOPIC CHOLECYSTECTOMY attempted cholangiogram;  Surgeon: Ralene Ok, MD;  Location: [facility];  Service: General;;  . DILATION AND CURETTAGE OF UTERUS     several  . HAMMER TOE SURGERY  10/01/2011   left toe  . LYMPH NODE BIOPSY  06/14/2012   Procedure: LYMPH NODE BIOPSY;  Surgeon: Haywood Lasso, MD;  Location: [facility];  Service: General;  Laterality: Left;  remove left neck lymph node  . TONSILLECTOMY    . TUBAL LIGATION      SOCIAL HISTORY: Social History   Social History  . Marital status: Married    Spouse name: N/A  . Number of children: N/A  . Years of education: N/A   Occupational History  . Not on file.   Social History Main Topics  . Smoking status: Never Smoker  . Smokeless tobacco: Never Used  . Alcohol use Yes     Comment: occ  . Drug use: No  . Sexual activity: Not on file   Other Topics Concern  . Not on file   Social History Narrative  . No narrative on file    FAMILY HISTORY: Family History  Problem Relation Age of Onset  . Hypertension Mother   . COPD Father   . Stroke Maternal Grandfather     Review of Systems  Constitutional: Negative for fever, chills, weight loss and malaise/fatigue.  HENT: Negative for congestion, hearing loss, nosebleeds, sore throat and tinnitus.  Eyes: Negative for blurred vision, double vision, pain and discharge.  Respiratory: Negative for cough, hemoptysis, sputum production, shortness of breath and wheezing.   Cardiovascular: Negative for chest pain, palpitations, claudication, leg swelling and PND.  Gastrointestinal: Negative for heartburn, nausea, vomiting, abdominal pain, diarrhea, constipation, blood in stool and melena.  Genitourinary: Negative for dysuria, urgency, frequency and hematuria.  Musculoskeletal: Negative for myalgias, joint pain and falls.  Skin: Negative for itching and rash.  Neurological: Negative for dizziness, tingling, tremors, sensory change, speech change,  focal weakness, seizures, loss of consciousness, weakness and headaches.  Endo/Heme/Allergies: Does not bruise/bleed easily.  Psychiatric/Behavioral: Negative for depression, suicidal ideas, memory loss and substance abuse, nervous/anxious.  The patient does not have insomnia.   14 point review of systems was performed and is negative except as detailed under history of present illness and above   PHYSICAL EXAMINATION  ECOG PERFORMANCE STATUS: 0 - Asymptomatic  Vitals:   02/22/16 1314  BP: (!) 143/61  Pulse: 62  Resp: 18  Temp: 97.7 F (36.5 C)    Physical Exam  Constitutional: She is oriented to person, place, and time and well-developed, well-nourished, and in no distress.  All teeth present HENT:  Head: Normocephalic and atraumatic.  Nose: Nose normal.  Mouth/Throat: Oropharynx is clear and moist. No oropharyngeal exudate.  Eyes: Conjunctivae and EOM are normal. Pupils are equal, round, and reactive to light. Right eye exhibits no discharge. Left eye exhibits no discharge. No scleral icterus.  Neck: Normal range of motion. Neck supple. No tracheal deviation present. No thyromegaly present.  Cardiovascular: Normal rate and regular rhythm.  Exam reveals no gallop and no friction rub.   Murmur heard. Pulmonary/Chest: Effort normal and breath sounds normal. She has no wheezes. She has no rales.  Abdominal: Soft. Bowel sounds are normal. She exhibits no distension and no mass. There is no tenderness. There is no rebound and no guarding.  Musculoskeletal: Normal range of motion. She exhibits no edema.  Lymphadenopathy:       Head (right side): No submental and no submandibular adenopathy present.       Head (left side): No submental and no submandibular adenopathy present.    She has no cervical adenopathy.       Right cervical: No superficial cervical, no deep cervical and no posterior cervical adenopathy present.      Left cervical: No superficial cervical, no deep cervical and no  posterior cervical adenopathy present.       Right axillary: No pectoral and no lateral adenopathy present.       Left axillary: No pectoral and no lateral adenopathy present.      Right: No supraclavicular adenopathy present.       Left: No supraclavicular adenopathy present.  Neurological: She is alert and oriented to person, place, and time. She has normal reflexes. No cranial nerve deficit. Gait normal. Coordination normal.  Skin: Skin is warm and dry. No rash noted.  Psychiatric: Mood, memory, affect and judgment normal.  Nursing note and vitals reviewed.    LABORATORY DATA: I have reviewed the data below as listed.   CBC    Component Value Date/Time   WBC 4.0 02/22/2016 1227   RBC 3.59 (L) 02/22/2016 1227   HGB 11.8 (L) 02/22/2016 1227   HCT 34.5 (L) 02/22/2016 1227   PLT 140 (L) 02/22/2016 1227   MCV 96.1 02/22/2016 1227   MCH 32.9 02/22/2016 1227   MCHC 34.2 02/22/2016 1227   RDW 13.3 02/22/2016 1227  LYMPHSABS 1.5 02/22/2016 1227   MONOABS 0.3 02/22/2016 1227   EOSABS 0.0 02/22/2016 1227   BASOSABS 0.0 02/22/2016 1227   CMP     Component Value Date/Time   NA 140 02/22/2016 1227   K 4.5 02/22/2016 1227   CL 105 02/22/2016 1227   CO2 31 02/22/2016 1227   GLUCOSE 100 (H) 02/22/2016 1227   BUN 11 02/22/2016 1227   CREATININE 0.73 02/22/2016 1227   CALCIUM 9.4 02/22/2016 1227   PROT 6.5 02/22/2016 1227   ALBUMIN 4.2 02/22/2016 1227   AST 22 02/22/2016 1227   ALT 19 02/22/2016 1227   ALKPHOS 72 02/22/2016 1227   BILITOT 1.1 02/22/2016 1227   GFRNONAA >60 02/22/2016 1227   GFRAA >60 02/22/2016 1227   ASSESSMENT and THERAPY PLAN:  History of cervical lymphadenopathy Biopsy showing atypical lymphoid proliferation suspicious for non-Hodgkin's lymphoma, flow cytometry unremarkable Mild anemia Mild thrombocytopenia   I recommended ongoing observation. Her exam is unremarkable. She has no B symptoms. She is overall doing quite well. She will return for follow  up in 1 year. She will contact us if anything changes or she'd like to be seen sooner.  She had a mammogram done on 08/01/15. She notes that she is up to date with other well care.   Labs reviewed. She has a very mild anemia and thrombocytopenia that is stable. I have discussed this with her, she notes her PCP follows her labs as well. She was encouraged to return sooner if counts worsen.  All questions were answered. The patient knows to call the clinic with any problems, questions or concerns. We can certainly see the patient much sooner if necessary.   This document serves as a record of services personally performed by Ancil Linsey, MD. It was created on her behalf by Arlyce Harman, a trained medical scribe. The creation of this record is based on the scribe's personal observations and the provider's statements to them. This document has been checked and approved by the attending provider.  I have reviewed the above documentation for accuracy and completeness, and I agree with the above.  This note was electronically signed.  Kelby Fam. Whitney Muse, MD

## 2016-02-22 NOTE — Patient Instructions (Signed)
[place] at [facility] Discharge Instructions  RECOMMENDATIONS MADE BY THE CONSULTANT AND ANY TEST RESULTS WILL BE SENT TO YOUR REFERRING PHYSICIAN.  You were seen today by Dr.Penland. Return in 1 year for follow up and labs.    Thank you for choosing [place] at [facility] to provide your oncology and hematology care.  To afford each patient quality time with our provider, please arrive at least 15 minutes before your scheduled appointment time.   Beginning January 23rd 2017 lab work for the [facility] will be done in the  Main lab at [facility] on 1st floor. If you have a lab appointment with the [facility] please come in thru the  Main Entrance and check in at the main information desk  You need to re-schedule your appointment should you arrive 10 or more minutes late.  We strive to give you quality time with our providers, and arriving late affects you and other patients whose appointments are after yours.  Also, if you no show three or more times for appointments you may be dismissed from the clinic at the providers discretion.     Again, thank you for choosing [facility].  Our hope is that these requests will decrease the amount of time that you wait before being seen by our physicians.       _____________________________________________________________  Should you have questions after your visit to [facility], please contact our office at [phone] between the hours of 8:30 a.m. and 4:30 p.m.  Voicemails left after 4:30 p.m. will not be returned until the following business day.  For prescription refill requests, have your pharmacy contact our office.         Resources For Cancer Patients and their Caregivers ? American Cancer Society: Can assist with transportation, wigs, general needs, runs Look Good Feel Better.        365 763 3154 ? Cancer Care: Provides financial assistance, online  support groups, medication/co-pay assistance.  1-800-813-HOPE 703-365-7799) ? [place] Assists [place] cancer patients and their families through emotional , educational and financial support.  (423)121-6170 ? [place] DSS Where to apply for food stamps, Medicaid and utility assistance. 765-402-9091 ? [facility]: Transportation to medical appointments. 9843387840 ? Social Security Administration: May apply for disability if have a Stage IV cancer. 215-812-0457 (531)741-2215 ? [employer], Disability and Transit Services: Assists with nutrition, care and transit needs. [facility] Support Programs: @10RELATIVEDAYS @ > Cancer Support Group  2nd Tuesday of the month 1pm-2pm, Journey Room  > Creative Journey  3rd Tuesday of the month 1130am-1pm, Journey Room  > Look Good Feel Better  1st Wednesday of the month 10am-12 noon, Journey Room (Call [place] to register [phone])

## 2016-03-02 ENCOUNTER — Encounter (HOSPITAL_COMMUNITY): Payer: Self-pay | Admitting: Hematology & Oncology

## 2016-03-02 DIAGNOSIS — D649 Anemia, unspecified: Secondary | ICD-10-CM | POA: Insufficient documentation

## 2016-07-02 NOTE — Progress Notes (Signed)
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 Patient is followed in [place]. She has been having serial echocardiograms for reported bicuspid aortic valve and aortic stenosis. She had one recently on January 11 of 2018. Images are not available but by report she had normal LV systolic function, mild left atrial enlargement, mild mitral regurgitation, bicuspid aortic valve with trace aortic insufficiency and severe aortic stenosis with mean gradient 43 mmHg. There was mild tricuspid regurgitation. Cardiology therefore asked to evaluate. Patient denies dyspnea on exertion, orthopnea, PND, palpitations or chest pain. Since December she has had occasional "dizzy spells". They are transient and not associated with chest pain or palpitations. She has not had syncope.   Current Outpatient Prescriptions  Medication Sig Dispense Refill  . acetaminophen (TYLENOL) 325 MG tablet Take 650 mg by mouth every 6 (six) hours as needed for pain.    Marland Kitchen aspirin 81 MG tablet Take 81 mg by mouth daily.    Marland Kitchen b complex vitamins tablet Take 1 tablet by mouth daily.    . Biotin 5000 MCG TABS Take 1 tablet by mouth daily.    Marland Kitchen CALCIUM PO Take 1 capsule by mouth daily.    . carvedilol (COREG) 6.25 MG tablet Take 6.25 mg by mouth 2 (two) times daily with a meal.     . Cholecalciferol (VITAMIN D) 2000 UNITS tablet Take 2,000 Units by mouth daily.    Marland Kitchen CINNAMON PO Take 1,000 mg by mouth daily.    . Coenzyme Q10 (CO Q 10 PO) Take 300 mg by mouth daily.    . fluticasone (FLONASE) 50 MCG/ACT nasal spray INSTILL 2 SPRAYS IN EACH NOSTRIL EVERY DAY 48 g 3  . folic acid (FOLVITE) A999333 MCG tablet Take 400 mcg by mouth daily.    . hydrochlorothiazide (HYDRODIURIL) 25 MG tablet Take 25 mg by mouth every morning.     Marland Kitchen losartan (COZAAR) 100 MG tablet Take 100 mg by mouth daily.     . pravastatin (PRAVACHOL) 20 MG tablet Take 20 mg by mouth daily.      No current  facility-administered medications for this visit.     Allergies  Allergen Reactions  . Penicillins Swelling  . Sulfa Antibiotics Hives     Past Medical History:  Diagnosis Date  . Aortic stenosis   . Arthritis   . Bicuspid aortic valve   . Cervical lymphadenopathy   . H/O bronchitis 07/2013  . Heart murmur 2012   echo  . Hyperlipemia   . Hypertension   . PONV (postoperative nausea and vomiting)   . Wears glasses     Past Surgical History:  Procedure Laterality Date  . ABDOMINAL HYSTERECTOMY  1985  . CARPAL TUNNEL RELEASE  1985   rt hand  . CHOLECYSTECTOMY  01/03/2013   Procedure: LAPAROSCOPIC CHOLECYSTECTOMY attempted cholangiogram;  Surgeon: Ralene Ok, MD;  Location: [place] [facility];  Service: General;;  . DILATION AND CURETTAGE OF UTERUS     several  . HAMMER TOE SURGERY  10/01/2011   left toe  . LYMPH NODE BIOPSY  06/14/2012   Procedure: LYMPH NODE BIOPSY;  Surgeon: Haywood Lasso, MD;  Location: [facility];  Service: General;  Laterality: Left;  remove left neck lymph node  . TONSILLECTOMY    . TUBAL LIGATION      Social History   Social History  . Marital status: Widowed    Spouse name: N/A  . Number of children:  1  . Years of education: N/A   Occupational History  . Not on file.   Social History Main Topics  . Smoking status: Never Smoker  . Smokeless tobacco: Never Used  . Alcohol use Yes     Comment: occ  . Drug use: No  . Sexual activity: Not on file   Other Topics Concern  . Not on file   Social History Narrative  . No narrative on file    Family History  Problem Relation Age of Onset  . Hypertension Mother   . Aortic stenosis Mother   . COPD Father   . Stroke Maternal Grandfather     ROS: no fevers or chills, productive cough, hemoptysis, dysphasia, odynophagia, melena, hematochezia, dysuria, hematuria, rash, seizure activity, orthopnea, PND, claudication. Remaining systems are negative.  Physical Exam:   Blood  pressure (!) 142/76, pulse 66, height 5\' 5"  (1.651 m), weight 198 lb 3.2 oz (89.9 kg).  General:  Well developed/obese in NAD Skin warm/dry Patient not depressed No peripheral clubbing Back-normal HEENT-normal/normal eyelids Neck supple/normal carotid upstroke bilaterally; no bruits; no JVD; no thyromegaly chest - CTA/ normal expansion CV - RRR/normal S1 and S2; no rubs or gallops;  PMI nondisplaced; 3/6 systolic murmur left sternal border radiating to the carotids. S2 appears to be preserved.  Abdomen -NT/ND, no HSM, no mass, + bowel sounds, no bruit 2+ femoral pulses, no bruits Ext-trace edema, no chords, 2+ DP Neuro-grossly nonfocal  ECG -Sinus rhythm at a rate of 66. RV conduction delay.  A/P  1 aortic stenosis-by report patient has severe aortic stenosis with mean gradient 43 mmHg. She is not having significant dyspnea or chest pain but is having occasional dizzy spells. This may be related to her valve disease. I will obtain her most recent images for review. If she has severe aortic stenosis she will likely need to proceed with aortic valve replacement. She would require cardiac catheterization prior to the procedure. I will see her back in 4 weeks once we have the images available.  2 history of bicuspid aortic valve by report-we will obtain images for review. I will schedule a CTA to rule out associated thoracic aortic aneurysm.  3 hypertension-blood pressure controlled. Continue present medications.  4 hyperlipidemia-continue statin.  5 history of palpitations-continue beta blocker.  Kirk Ruths, MD

## 2016-07-09 ENCOUNTER — Other Ambulatory Visit (INDEPENDENT_AMBULATORY_CARE_PROVIDER_SITE_OTHER): Payer: Medicare Other

## 2016-07-09 ENCOUNTER — Ambulatory Visit (INDEPENDENT_AMBULATORY_CARE_PROVIDER_SITE_OTHER): Payer: Medicare Other | Admitting: Cardiology

## 2016-07-09 ENCOUNTER — Encounter: Payer: Self-pay | Admitting: Cardiology

## 2016-07-09 ENCOUNTER — Ambulatory Visit (INDEPENDENT_AMBULATORY_CARE_PROVIDER_SITE_OTHER)
Admission: RE | Admit: 2016-07-09 | Discharge: 2016-07-09 | Disposition: A | Discharge status: Home / Self Care | Payer: Medicare Other | Source: Ambulatory Visit | Attending: Cardiology | Admitting: Cardiology

## 2016-07-09 VITALS — BP 142/76 | HR 66 | Ht 65.0 in | Wt 198.2 lb

## 2016-07-09 DIAGNOSIS — Q231 Congenital insufficiency of aortic valve: Secondary | ICD-10-CM | POA: Diagnosis not present

## 2016-07-09 DIAGNOSIS — Q2381 Bicuspid aortic valve: Secondary | ICD-10-CM

## 2016-07-09 DIAGNOSIS — I350 Nonrheumatic aortic (valve) stenosis: Secondary | ICD-10-CM

## 2016-07-09 LAB — BASIC METABOLIC PANEL
BUN/Creatinine Ratio: 15 (ref 12–28)
BUN: 13 mg/dL (ref 8–27)
CALCIUM: 9.5 mg/dL (ref 8.7–10.3)
CO2: 31 mmol/L — AB (ref 18–29)
CREATININE: 0.85 mg/dL (ref 0.57–1.00)
Chloride: 102 mmol/L (ref 96–106)
GFR calc Af Amer: 78 mL/min/{1.73_m2} (ref >59)
GFR, EST NON AFRICAN AMERICAN: 68 mL/min/{1.73_m2} (ref >59)
Glucose: 99 mg/dL (ref 65–99)
Potassium: 4.1 mmol/L (ref 3.5–5.2)
Sodium: 140 mmol/L (ref 134–144)

## 2016-07-09 MED ORDER — IOPAMIDOL (ISOVUE-370) INJECTION 76%
100.0000 mL | Freq: Once | INTRAVENOUS | Status: AC | PRN
  Administered 2016-07-09: 100 mL via INTRAVENOUS

## 2016-07-09 NOTE — Patient Instructions (Signed)
Medication Instructions:   NO CHANGE  Labwork:  Your physician recommends that you HAVE LAB WORK TODAY  Testing/Procedures:  Non-Cardiac CT Angiography (CTA), is a special type of CT scan that uses a computer to produce multi-dimensional views of major blood vessels throughout the body. In CT angiography, a contrast material is injected through an IV to help visualize the blood vessels CTA OF THE CHEST FOR BICUSPID AORTIC VALVE  Follow-Up:  Your physician recommends that you schedule a follow-up appointment in: [place]

## 2016-07-09 NOTE — Addendum Note (Signed)
Addended by: Cristopher Estimable on: 07/09/2016 11:22 AM   Modules accepted: Orders

## 2016-07-14 NOTE — Addendum Note (Signed)
Addended by: Vennie Homans on: 07/14/2016 02:47 PM   Modules accepted: Orders

## 2016-07-30 ENCOUNTER — Telehealth: Payer: Self-pay | Admitting: Cardiology

## 2016-07-30 NOTE — Telephone Encounter (Signed)
°   New message     pt verbalized that she is calling to speak to rn    To confirm if Dr. Stanford Breed received the CD of echo from January   Please call

## 2016-07-30 NOTE — Telephone Encounter (Signed)
I reviewed, and spoke w patient. We do have echo report but no scanned images from the study. Pt noted when she spoke w Dr. Stanford Breed at last [place], he wanted to be able to review the images. She states she is going to obtain a copy of the CD with images on this and bring to next appt. I offered to contact [facility] and get this sent in. She declined need for [place] to send records request to [facility], states she'll feel better having it in hand to pass along to Dr. Stanford Breed. She stated no further needs at this time, and voiced thanks for call.

## 2016-08-01 NOTE — Progress Notes (Signed)
    HPI: FU bicuspid aortic valve and severe aortic stenosis. Patient is followed in [place]. She has been having serial echocardiograms for reported bicuspid aortic valve and aortic stenosis. Last study 06/12/16 showed normal LV systolic function, mild left atrial enlargement, mild mitral regurgitation, bicuspid aortic valve with trace aortic insufficiency and severe aortic stenosis with mean gradient 43 mmHg. I reviewed images personally and agreed with findings. CTA 2/18 showed no TAA; LLL pulmonary nodule; fu 12 months. Since last seen, she notes some increased dyspnea on exertion but no orthopnea, PND, pedal edema, chest pain or syncope. Occasional presyncopal episodes.  Current Outpatient Prescriptions  Medication Sig Dispense Refill  . acetaminophen (TYLENOL) 325 MG tablet Take 650 mg by mouth every 6 (six) hours as needed for pain.    . aspirin 81 MG tablet Take 81 mg by mouth daily.    . b complex vitamins tablet Take 1 tablet by mouth daily.    . Biotin 5000 MCG TABS Take 1 tablet by mouth daily.    . CALCIUM PO Take 1 capsule by mouth daily.    . carvedilol (COREG) 6.25 MG tablet Take 6.25 mg by mouth 2 (two) times daily with a meal.     . Cholecalciferol (VITAMIN D) 2000 UNITS tablet Take 2,000 Units by mouth daily.    . CINNAMON PO Take 1,000 mg by mouth daily.    . Coenzyme Q10 (CO Q 10 PO) Take 300 mg by mouth daily.    . fluticasone (FLONASE) 50 MCG/ACT nasal spray INSTILL 2 SPRAYS IN EACH NOSTRIL EVERY DAY 48 g 3  . folic acid (FOLVITE) 400 MCG tablet Take 400 mcg by mouth daily.    . hydrochlorothiazide (HYDRODIURIL) 25 MG tablet Take 25 mg by mouth every morning.     . losartan (COZAAR) 100 MG tablet Take 100 mg by mouth daily.     . pravastatin (PRAVACHOL) 20 MG tablet Take 20 mg by mouth daily.      No current facility-administered medications for this visit.      Past Medical History:  Diagnosis Date  . Aortic stenosis   . Arthritis   . Bicuspid  aortic valve   . Cervical lymphadenopathy   . H/O bronchitis 07/2013  . Heart murmur 2012   echo  . Hyperlipemia   . Hypertension   . PONV (postoperative nausea and vomiting)   . Wears glasses     Past Surgical History:  Procedure Laterality Date  . ABDOMINAL HYSTERECTOMY  1985  . CARPAL TUNNEL RELEASE  1985   rt hand  . CHOLECYSTECTOMY  01/03/2013   Procedure: LAPAROSCOPIC CHOLECYSTECTOMY attempted cholangiogram;  Surgeon: Armando Ramirez, MD;  [facility];  Service: General;;  . DILATION AND CURETTAGE OF UTERUS     several  . HAMMER TOE SURGERY  10/01/2011   left toe  . LYMPH NODE BIOPSY  06/14/2012   Procedure: LYMPH NODE BIOPSY;  Surgeon: Christian J Streck, MD;  Location: [facility];  Service: General;  Laterality: Left;  remove left neck lymph node  . TONSILLECTOMY    . TUBAL LIGATION      Social History   Social History  . Marital status: Widowed    Spouse name: N/A  . Number of children: 1  . Years of education: N/A   Occupational History  . Not on file.   Social History Main Topics  . Smoking status: Never Smoker  . Smokeless tobacco: Never Used  . Alcohol   use Yes     Comment: occ  . Drug use: No  . Sexual activity: Not on file   Other Topics Concern  . Not on file   Social History Narrative  . No narrative on file    Family History  Problem Relation Age of Onset  . Hypertension Mother   . Aortic stenosis Mother   . COPD Father   . Stroke Maternal Grandfather     ROS: no fevers or chills, productive cough, hemoptysis, dysphasia, odynophagia, melena, hematochezia, dysuria, hematuria, rash, seizure activity, orthopnea, PND, pedal edema, claudication. Remaining systems are negative.  Physical Exam: Well-developed well-nourished in no acute distress.  Skin is warm and dry.  HEENT is normal.  Neck is supple.  Chest is clear to auscultation with normal expansion.  Cardiovascular exam is regular rate and rhythm. 3/6 systolic  murmur LSB Abdominal exam nontender or distended. No masses palpated. Extremities show no edema. neuro grossly intact   A/P  1 Aortic stenosis-I have personally reviewed her echocardiogram. She does have severe aortic stenosis. There is no thoracic aortic aneurysm on CT scan. She is symptomatic with increased dyspnea on exertion and occasional presyncope. I think we should proceed with aortic valve replacement and she agrees. We will arrange a right and left cardiac catheterization. The risks and benefits including myocardial infarction, CVA and death were discussed and she agrees to proceed. I will arrange surgical evaluation as well.   2 bicuspid aortic valve-plan as above.  3 hypertension-blood pressure controlled. Continue present medications.  4 hyperlipidemia-continue statin.  5 history of palpitations-continue beta blocker.  6 lung nodule-plan follow-up CT February 2019.   Brian [place], MD    

## 2016-08-07 ENCOUNTER — Encounter: Payer: Self-pay | Admitting: Cardiology

## 2016-08-07 ENCOUNTER — Other Ambulatory Visit: Payer: Self-pay | Admitting: Cardiology

## 2016-08-07 ENCOUNTER — Ambulatory Visit (INDEPENDENT_AMBULATORY_CARE_PROVIDER_SITE_OTHER): Payer: Medicare Other | Admitting: Cardiology

## 2016-08-07 ENCOUNTER — Ambulatory Visit
Admission: RE | Admit: 2016-08-07 | Discharge: 2016-08-07 | Disposition: A | Discharge status: Home / Self Care | Payer: Medicare Other | Source: Ambulatory Visit | Attending: Cardiology | Admitting: Cardiology

## 2016-08-07 VITALS — BP 134/68 | HR 72 | Ht 65.0 in | Wt 199.0 lb

## 2016-08-07 DIAGNOSIS — I350 Nonrheumatic aortic (valve) stenosis: Secondary | ICD-10-CM | POA: Diagnosis not present

## 2016-08-07 DIAGNOSIS — D689 Coagulation defect, unspecified: Secondary | ICD-10-CM

## 2016-08-07 DIAGNOSIS — Z01812 Encounter for preprocedural laboratory examination: Secondary | ICD-10-CM

## 2016-08-07 DIAGNOSIS — R5383 Other fatigue: Secondary | ICD-10-CM | POA: Diagnosis not present

## 2016-08-07 LAB — CBC
HCT: 37 % (ref 35.0–45.0)
Hemoglobin: 12.5 g/dL (ref 11.7–15.5)
MCH: 33.7 pg — ABNORMAL HIGH (ref 27.0–33.0)
MCHC: 33.8 g/dL (ref 32.0–36.0)
MCV: 99.7 fL (ref 80.0–100.0)
MPV: 9.4 fL (ref 7.5–12.5)
PLATELETS: 174 10*3/uL (ref 140–400)
RBC: 3.71 MIL/uL — AB (ref 3.80–5.10)
RDW: 14.3 % (ref 11.0–15.0)
WBC: 5.4 10*3/uL (ref 3.8–10.8)

## 2016-08-07 LAB — BASIC METABOLIC PANEL
BUN: 11 mg/dL (ref 7–25)
CALCIUM: 9.7 mg/dL (ref 8.6–10.4)
CHLORIDE: 103 mmol/L (ref 98–110)
CO2: 29 mmol/L (ref 20–31)
Creat: 0.85 mg/dL (ref 0.60–0.93)
GLUCOSE: 98 mg/dL (ref 65–99)
POTASSIUM: 4.8 mmol/L (ref 3.5–5.3)
SODIUM: 142 mmol/L (ref 135–146)

## 2016-08-07 LAB — PROTIME-INR
INR: 1
Prothrombin Time: 10.2 s (ref 9.0–11.5)

## 2016-08-07 LAB — TSH: TSH: 1.63 m[IU]/L

## 2016-08-07 LAB — APTT: APTT: 24 s (ref 22–34)

## 2016-08-07 NOTE — Patient Instructions (Signed)
Your physician has requested that you have a right and left cardiac catheterization. Cardiac catheterization is used to diagnose and/or treat various heart conditions. Doctors may recommend this procedure for a number of different reasons. The most common reason is to evaluate chest pain. Chest pain can be a symptom of coronary artery disease (CAD), and cardiac catheterization can show whether plaque is narrowing or blocking your heart's arteries. This procedure is also used to evaluate the valves, as well as measure the blood flow and oxygen levels in different parts of your heart. For further information please visit [email or web address].   Following your catheterization, you will not be allowed to drive for 3 days.  No lifting, pushing, or pulling greater that 10 pounds is allowed for 1 week.  You will be required to have the following tests prior to the procedure:  1. Blood work-the blood work can be done no more than 7 days prior to the procedure.  It can be done at any [facility] lab.  There is one downstairs on the first floor of this building and one in the [facility] building [phone]).  2. Chest Xray-the chest xray order has already been placed at the [facility].    You have also been referred to Dr Roxy Manns, a cardiothoracic surgeon, to discuss valve replacement.   Dr Stanford Breed recommends that you schedule a follow-up appointment in 3 months.

## 2016-08-14 ENCOUNTER — Ambulatory Visit (HOSPITAL_COMMUNITY)
Admission: RE | Admit: 2016-08-14 | Discharge: 2016-08-14 | Disposition: A | Discharge status: Home / Self Care | Payer: Medicare Other | Source: Ambulatory Visit | Attending: Cardiovascular Disease | Admitting: Cardiovascular Disease

## 2016-08-14 ENCOUNTER — Encounter (HOSPITAL_COMMUNITY)
Admission: RE | Disposition: A | Discharge status: Home / Self Care | Payer: Self-pay | Source: Ambulatory Visit | Attending: Cardiovascular Disease

## 2016-08-14 DIAGNOSIS — Z8249 Family history of ischemic heart disease and other diseases of the circulatory system: Secondary | ICD-10-CM | POA: Diagnosis not present

## 2016-08-14 DIAGNOSIS — Q231 Congenital insufficiency of aortic valve: Secondary | ICD-10-CM | POA: Diagnosis not present

## 2016-08-14 DIAGNOSIS — R911 Solitary pulmonary nodule: Secondary | ICD-10-CM | POA: Diagnosis not present

## 2016-08-14 DIAGNOSIS — Z823 Family history of stroke: Secondary | ICD-10-CM | POA: Diagnosis not present

## 2016-08-14 DIAGNOSIS — M1990 Unspecified osteoarthritis, unspecified site: Secondary | ICD-10-CM | POA: Insufficient documentation

## 2016-08-14 DIAGNOSIS — I340 Nonrheumatic mitral (valve) insufficiency: Secondary | ICD-10-CM | POA: Insufficient documentation

## 2016-08-14 DIAGNOSIS — I10 Essential (primary) hypertension: Secondary | ICD-10-CM | POA: Diagnosis not present

## 2016-08-14 DIAGNOSIS — E785 Hyperlipidemia, unspecified: Secondary | ICD-10-CM | POA: Insufficient documentation

## 2016-08-14 DIAGNOSIS — Z7951 Long term (current) use of inhaled steroids: Secondary | ICD-10-CM | POA: Diagnosis not present

## 2016-08-14 DIAGNOSIS — Z7982 Long term (current) use of aspirin: Secondary | ICD-10-CM | POA: Insufficient documentation

## 2016-08-14 DIAGNOSIS — I350 Nonrheumatic aortic (valve) stenosis: Secondary | ICD-10-CM | POA: Diagnosis present

## 2016-08-14 LAB — POCT I-STAT 3, VENOUS BLOOD GAS (G3P V)
ACID-BASE EXCESS: 2 mmol/L (ref 0.0–2.0)
Acid-Base Excess: 2 mmol/L (ref 0.0–2.0)
Acid-Base Excess: 2 mmol/L (ref 0.0–2.0)
BICARBONATE: 26.7 mmol/L (ref 20.0–28.0)
BICARBONATE: 27.2 mmol/L (ref 20.0–28.0)
BICARBONATE: 27.6 mmol/L (ref 20.0–28.0)
O2 SAT: 76 %
O2 SAT: 76 %
O2 Saturation: 78 %
PCO2 VEN: 43.9 mmHg — AB (ref 44.0–60.0)
PCO2 VEN: 44.9 mmHg (ref 44.0–60.0)
PCO2 VEN: 45.7 mmHg (ref 44.0–60.0)
PH VEN: 7.389 (ref 7.250–7.430)
PO2 VEN: 42 mmHg (ref 32.0–45.0)
TCO2: 28 mmol/L (ref 0–100)
TCO2: 29 mmol/L (ref 0–100)
TCO2: 29 mmol/L (ref 0–100)
pH, Ven: 7.39 (ref 7.250–7.430)
pH, Ven: 7.392 (ref 7.250–7.430)
pO2, Ven: 43 mmHg (ref 32.0–45.0)
pO2, Ven: 42 mmHg (ref 32.0–45.0)

## 2016-08-14 SURGERY — RIGHT HEART CATH AND CORONARY ANGIOGRAPHY
Anesthesia: LOCAL

## 2016-08-14 MED ORDER — MIDAZOLAM HCL 2 MG/2ML IJ SOLN
INTRAMUSCULAR | Status: DC | PRN
  Administered 2016-08-14: 2 mg via INTRAVENOUS

## 2016-08-14 MED ORDER — FENTANYL CITRATE (PF) 100 MCG/2ML IJ SOLN
INTRAMUSCULAR | Status: AC
  Filled 2016-08-14: qty 2

## 2016-08-14 MED ORDER — HEPARIN (PORCINE) IN NACL 2-0.9 UNIT/ML-% IJ SOLN
INTRAMUSCULAR | Status: AC
  Filled 2016-08-14: qty 1000

## 2016-08-14 MED ORDER — SODIUM CHLORIDE 0.9% FLUSH: 3.0000 mL | INTRAVENOUS | Status: DC | PRN

## 2016-08-14 MED ORDER — SODIUM CHLORIDE 0.9 % WEIGHT BASED INFUSION
3.0000 mL/kg/h | INTRAVENOUS | Status: DC
  Administered 2016-08-14: 3 mL/kg/h via INTRAVENOUS

## 2016-08-14 MED ORDER — SODIUM CHLORIDE 0.9 % WEIGHT BASED INFUSION: 1.0000 mL/kg/h | INTRAVENOUS | Status: DC

## 2016-08-14 MED ORDER — ASPIRIN 81 MG PO CHEW
81.0000 mg | CHEWABLE_TABLET | ORAL | Status: AC
  Administered 2016-08-14: 81 mg via ORAL

## 2016-08-14 MED ORDER — VERAPAMIL HCL 2.5 MG/ML IV SOLN
INTRAVENOUS | Status: AC
  Filled 2016-08-14: qty 2

## 2016-08-14 MED ORDER — IOPAMIDOL (ISOVUE-370) INJECTION 76%
INTRAVENOUS | Status: AC
  Filled 2016-08-14: qty 100

## 2016-08-14 MED ORDER — HEPARIN SODIUM (PORCINE) 1000 UNIT/ML IJ SOLN
INTRAMUSCULAR | Status: AC
  Filled 2016-08-14: qty 1

## 2016-08-14 MED ORDER — SODIUM CHLORIDE 0.9 % IV SOLN: 250.0000 mL | INTRAVENOUS | Status: DC | PRN

## 2016-08-14 MED ORDER — HEPARIN (PORCINE) IN NACL 2-0.9 UNIT/ML-% IJ SOLN
INTRAMUSCULAR | Status: DC | PRN
  Administered 2016-08-14: 1000 mL

## 2016-08-14 MED ORDER — HEPARIN SODIUM (PORCINE) 1000 UNIT/ML IJ SOLN
INTRAMUSCULAR | Status: DC | PRN
  Administered 2016-08-14: 4000 [IU] via INTRAVENOUS

## 2016-08-14 MED ORDER — LIDOCAINE HCL (PF) 1 % IJ SOLN
INTRAMUSCULAR | Status: DC | PRN
  Administered 2016-08-14 (×2): 2 mL

## 2016-08-14 MED ORDER — LIDOCAINE HCL (PF) 1 % IJ SOLN
INTRAMUSCULAR | Status: AC
  Filled 2016-08-14: qty 30

## 2016-08-14 MED ORDER — HEPARIN (PORCINE) IN NACL 2-0.9 UNIT/ML-% IJ SOLN
INTRAMUSCULAR | Status: DC | PRN
  Administered 2016-08-14: 10 mL via INTRA_ARTERIAL

## 2016-08-14 MED ORDER — ACETAMINOPHEN 325 MG PO TABS: 650.0000 mg | ORAL_TABLET | ORAL | Status: DC | PRN

## 2016-08-14 MED ORDER — IOPAMIDOL (ISOVUE-370) INJECTION 76%
INTRAVENOUS | Status: DC | PRN
  Administered 2016-08-14: 40 mL via INTRA_ARTERIAL

## 2016-08-14 MED ORDER — ONDANSETRON HCL 4 MG/2ML IJ SOLN: 4.0000 mg | Freq: Four times a day (QID) | INTRAMUSCULAR | Status: DC | PRN

## 2016-08-14 MED ORDER — MIDAZOLAM HCL 2 MG/2ML IJ SOLN
INTRAMUSCULAR | Status: AC
  Filled 2016-08-14: qty 2

## 2016-08-14 MED ORDER — SODIUM CHLORIDE 0.9% FLUSH: 3.0000 mL | Freq: Two times a day (BID) | INTRAVENOUS | Status: DC

## 2016-08-14 MED ORDER — ASPIRIN 81 MG PO CHEW
CHEWABLE_TABLET | ORAL | Status: AC
  Administered 2016-08-14: 81 mg via ORAL
  Filled 2016-08-14: qty 1

## 2016-08-14 MED ORDER — FENTANYL CITRATE (PF) 100 MCG/2ML IJ SOLN
INTRAMUSCULAR | Status: DC | PRN
  Administered 2016-08-14: 25 ug via INTRAVENOUS

## 2016-08-14 SURGICAL SUPPLY — 12 items
CATH BALLN WEDGE 5F 110CM (CATHETERS) ×2 IMPLANT
CATH INFINITI 5 FR JL3.5 (CATHETERS) ×2 IMPLANT
CATH INFINITI JR4 5F (CATHETERS) ×2 IMPLANT
DEVICE RAD COMP TR BAND LRG (VASCULAR PRODUCTS) ×2 IMPLANT
GLIDESHEATH SLEND SS 6F .021 (SHEATH) ×2 IMPLANT
GUIDEWIRE INQWIRE 1.5J.035X260 (WIRE) ×1 IMPLANT
INQWIRE 1.5J .035X260CM (WIRE) ×2
KIT HEART LEFT (KITS) ×2 IMPLANT
PACK CARDIAC CATHETERIZATION (CUSTOM PROCEDURE TRAY) ×2 IMPLANT
SHEATH FAST CATH BRACH 5F 5CM (SHEATH) ×2 IMPLANT
TRANSDUCER W/STOPCOCK (MISCELLANEOUS) ×2 IMPLANT
TUBING CIL FLEX 10 FLL-RA (TUBING) ×2 IMPLANT

## 2016-08-14 NOTE — Progress Notes (Addendum)
Manual pressure held for 15 minutes by Denice Paradise, RN due to slow ooze. Will continue to monitor.

## 2016-08-14 NOTE — H&P (View-Only) (Signed)
HPI: FU bicuspid aortic valve and severe aortic stenosis. Patient is followed in [place]. She has been having serial echocardiograms for reported bicuspid aortic valve and aortic stenosis. Last study 06/12/16 showed normal LV systolic function, mild left atrial enlargement, mild mitral regurgitation, bicuspid aortic valve with trace aortic insufficiency and severe aortic stenosis with mean gradient 43 mmHg. I reviewed images personally and agreed with findings. CTA 2/18 showed no TAA; LLL pulmonary nodule; fu 12 months. Since last seen, she notes some increased dyspnea on exertion but no orthopnea, PND, pedal edema, chest pain or syncope. Occasional presyncopal episodes.  Current Outpatient Prescriptions  Medication Sig Dispense Refill  . acetaminophen (TYLENOL) 325 MG tablet Take 650 mg by mouth every 6 (six) hours as needed for pain.    Marland Kitchen aspirin 81 MG tablet Take 81 mg by mouth daily.    Marland Kitchen b complex vitamins tablet Take 1 tablet by mouth daily.    . Biotin 5000 MCG TABS Take 1 tablet by mouth daily.    Marland Kitchen CALCIUM PO Take 1 capsule by mouth daily.    . carvedilol (COREG) 6.25 MG tablet Take 6.25 mg by mouth 2 (two) times daily with a meal.     . Cholecalciferol (VITAMIN D) 2000 UNITS tablet Take 2,000 Units by mouth daily.    Marland Kitchen CINNAMON PO Take 1,000 mg by mouth daily.    . Coenzyme Q10 (CO Q 10 PO) Take 300 mg by mouth daily.    . fluticasone (FLONASE) 50 MCG/ACT nasal spray INSTILL 2 SPRAYS IN EACH NOSTRIL EVERY DAY 48 g 3  . folic acid (FOLVITE) 751 MCG tablet Take 400 mcg by mouth daily.    . hydrochlorothiazide (HYDRODIURIL) 25 MG tablet Take 25 mg by mouth every morning.     Marland Kitchen losartan (COZAAR) 100 MG tablet Take 100 mg by mouth daily.     . pravastatin (PRAVACHOL) 20 MG tablet Take 20 mg by mouth daily.      No current facility-administered medications for this visit.      Past Medical History:  Diagnosis Date  . Aortic stenosis   . Arthritis   . Bicuspid  aortic valve   . Cervical lymphadenopathy   . H/O bronchitis 07/2013  . Heart murmur 2012   echo  . Hyperlipemia   . Hypertension   . PONV (postoperative nausea and vomiting)   . Wears glasses     Past Surgical History:  Procedure Laterality Date  . ABDOMINAL HYSTERECTOMY  1985  . CARPAL TUNNEL RELEASE  1985   rt hand  . CHOLECYSTECTOMY  01/03/2013   Procedure: LAPAROSCOPIC CHOLECYSTECTOMY attempted cholangiogram;  Surgeon: Ralene Ok, MD;  Location: [facility];  Service: General;;  . DILATION AND CURETTAGE OF UTERUS     several  . HAMMER TOE SURGERY  10/01/2011   left toe  . LYMPH NODE BIOPSY  06/14/2012   Procedure: LYMPH NODE BIOPSY;  Surgeon: Haywood Lasso, MD;  Location: [facility];  Service: General;  Laterality: Left;  remove left neck lymph node  . TONSILLECTOMY    . TUBAL LIGATION      Social History   Social History  . Marital status: Widowed    Spouse name: N/A  . Number of children: 1  . Years of education: N/A   Occupational History  . Not on file.   Social History Main Topics  . Smoking status: Never Smoker  . Smokeless tobacco: Never Used  . Alcohol  use Yes     Comment: occ  . Drug use: No  . Sexual activity: Not on file   Other Topics Concern  . Not on file   Social History Narrative  . No narrative on file    Family History  Problem Relation Age of Onset  . Hypertension Mother   . Aortic stenosis Mother   . COPD Father   . Stroke Maternal Grandfather     ROS: no fevers or chills, productive cough, hemoptysis, dysphasia, odynophagia, melena, hematochezia, dysuria, hematuria, rash, seizure activity, orthopnea, PND, pedal edema, claudication. Remaining systems are negative.  Physical Exam: Well-developed well-nourished in no acute distress.  Skin is warm and dry.  HEENT is normal.  Neck is supple.  Chest is clear to auscultation with normal expansion.  Cardiovascular exam is regular rate and rhythm. 3/6 systolic  murmur LSB Abdominal exam nontender or distended. No masses palpated. Extremities show no edema. neuro grossly intact   A/P  1 Aortic stenosis-I have personally reviewed her echocardiogram. She does have severe aortic stenosis. There is no thoracic aortic aneurysm on CT scan. She is symptomatic with increased dyspnea on exertion and occasional presyncope. I think we should proceed with aortic valve replacement and she agrees. We will arrange a right and left cardiac catheterization. The risks and benefits including myocardial infarction, CVA and death were discussed and she agrees to proceed. I will arrange surgical evaluation as well.   2 bicuspid aortic valve-plan as above.  3 hypertension-blood pressure controlled. Continue present medications.  4 hyperlipidemia-continue statin.  5 history of palpitations-continue beta blocker.  6 lung nodule-plan follow-up CT February 2019.   Kirk Ruths, MD

## 2016-08-14 NOTE — Progress Notes (Signed)
Site area: right wrist  Site Prior to Removal:  Level 0  Pressure Applied For 10 MINUTES    Minutes Beginning at 1100  Manual:   yes  Patient Status During Pull:  good  Post site condition:  good}  Post Pull Instructions Given:yes  Post Pull Pulses Present:  yes  Dressing Applied: yes Comments: Pt tolerated procedure well

## 2016-08-14 NOTE — Progress Notes (Signed)
Pt has ambulated to BR twice to void, standby assist, tolerated well. Has been eating and drinking without difficulty.

## 2016-08-14 NOTE — Discharge Instructions (Signed)
Radial Site Care °Refer to this sheet in the next few weeks. These instructions provide you with information about caring for yourself after your procedure. Your health care provider may also give you more specific instructions. Your treatment has been planned according to current medical practices, but problems sometimes occur. Call your health care provider if you have any problems or questions after your procedure. °What can I expect after the procedure? °After your procedure, it is typical to have the following: °· Bruising at the radial site that usually fades within 1-2 weeks. °· Blood collecting in the tissue (hematoma) that may be painful to the touch. It should usually decrease in size and tenderness within 1-2 weeks. °Follow these instructions at home: °· Take medicines only as directed by your health care provider. °· You may shower 24-48 hours after the procedure or as directed by your health care provider. Remove the bandage (dressing) and gently wash the site with plain soap and water. Pat the area dry with a clean towel. Do not rub the site, because this may cause bleeding. °· Do not take baths, swim, or use a hot tub until your health care provider approves. °· Check your insertion site every day for redness, swelling, or drainage. °· Do not apply powder or lotion to the site. °· Do not flex or bend the affected arm for 24 hours or as directed by your health care provider. °· Do not push or pull heavy objects with the affected arm for 24 hours or as directed by your health care provider. °· Do not lift over 10 lb (4.5 kg) for 5 days after your procedure or as directed by your health care provider. °· Ask your health care provider when it is okay to: °¨ Return to work or school. °¨ Resume usual physical activities or sports. °¨ Resume sexual activity. °· Do not drive home if you are discharged the same day as the procedure. Have someone else drive you. °· You may drive 24 hours after the procedure  unless otherwise instructed by your health care provider. °· Do not operate machinery or power tools for 24 hours after the procedure. °· If your procedure was done as an outpatient procedure, which means that you went home the same day as your procedure, a responsible adult should be with you for the first 24 hours after you arrive home. °· Keep all follow-up visits as directed by your health care provider. This is important. °Contact a health care provider if: °· You have a fever. °· You have chills. °· You have increased bleeding from the radial site. Hold pressure on the site. °Get help right away if: °· You have unusual pain at the radial site. °· You have redness, warmth, or swelling at the radial site. °· You have drainage (other than a small amount of blood on the dressing) from the radial site. °· The radial site is bleeding, and the bleeding does not stop after 30 minutes of holding steady pressure on the site. °· Your arm or hand becomes pale, cool, tingly, or numb. °This information is not intended to replace advice given to you by your health care provider. Make sure you discuss any questions you have with your health care provider. °Document Released: 06/21/2010 Document Revised: 10/25/2015 Document Reviewed: 12/05/2013 °Elsevier Interactive Patient Education © 2017 [employer] ° °

## 2016-08-14 NOTE — Interval H&P Note (Signed)
History and Physical Interval Note:  08/14/2016 9:44 AM  Jasmin Allen  has presented today for surgery, with the diagnosis of arotic stenosis  The various methods of treatment have been discussed with the patient and family. After consideration of risks, benefits and other options for treatment, the patient has consented to  Procedure(s): Right/Left Heart Cath and Coronary Angiography (N/A) as a surgical intervention .  The patient's history has been reviewed, patient examined, no change in status, stable for surgery.  I have reviewed the patient's chart and labs.  Questions were answered to the patient's satisfaction.     Sherren Mocha

## 2016-08-19 ENCOUNTER — Institutional Professional Consult (permissible substitution) (INDEPENDENT_AMBULATORY_CARE_PROVIDER_SITE_OTHER): Payer: Medicare Other | Admitting: Thoracic Surgery (Cardiothoracic Vascular Surgery)

## 2016-08-19 ENCOUNTER — Other Ambulatory Visit: Payer: Self-pay

## 2016-08-19 ENCOUNTER — Encounter: Payer: Self-pay | Admitting: Thoracic Surgery (Cardiothoracic Vascular Surgery)

## 2016-08-19 VITALS — BP 135/79 | HR 73 | Resp 20 | Ht 65.0 in | Wt 195.0 lb

## 2016-08-19 DIAGNOSIS — I350 Nonrheumatic aortic (valve) stenosis: Secondary | ICD-10-CM

## 2016-08-19 NOTE — Patient Instructions (Signed)
Stop taking aspirin 2 weeks prior to surgery  Continue taking all other medications without change through the day before surgery.  Have nothing to eat or drink after midnight the night before surgery.  On the morning of surgery take only carvedilol (Coreg) with a sip of water.

## 2016-08-19 NOTE — Progress Notes (Signed)
[facility]  CARDIOTHORACIC SURGERY CONSULTATION REPORT  Referring Provider is Crenshaw, Denice Bors, MD PCP is Lonia Mad, MD  Chief Complaint  Patient presents with  . Aortic Stenosis    Surgical eval for possible AVR, Cardiac Cath 08/14/16, ECHO 08/18/16, CTA Chest 07/09/16     HPI:  Patient is a 76 year old female with history of bicuspid aortic valve and aortic stenosis referred for elective surgical consultation. The patient states that she was first noted to have a heart murmur on routine physical exam nearly 20 years ago. She has been followed by her primary care physician with echocardiograms which have demonstrated gradual progression of aortic stenosis. Transthoracic echocardiogram performed in 2016 reportedly demonstrated severe aortic stenosis. At the time the patient was relatively asymptomatic and weight loss was recommended. Recently the patient was seen in follow-up by her primary care physician and noted to report mild symptoms of exertional shortness of breath with occasional dizzy spells.  Follow-up echocardiogram performed at [facility] in [place] revealed severe aortic stenosis with normal left ventricular systolic function. Peak velocity across the aortic valve was reported 4.2 m/s corresponding to mean transvalvular gradient 43 mmHg. Findings were reportedly similar to the echocardiogram performed in 2016. She was seen in consultation by Dr. Stanford Breed and subsequently scheduled for diagnostic cardiac catheterization. Catheterization performed by Dr. Burt Knack revealed normal coronary arteries with no significant coronary artery disease.  The patient has subsequently been referred for elective surgical consultation.  The patient is widowed and lives in a patio home community in [place]. 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 She has remained reasonably active and functionally completely  independent, although she has been obese for most of her adult life. She drives an automobile and tends to her own personal needs.  She states that she has noted mild symptoms of exertional shortness of breath and fatigue that occur with more strenuous exertion, such as walking at a brisk pace. The cold weather has seemed to make symptoms a little worse. She has also recently developed some intermittent dizzy spells without any history of syncope. She denies any chest pain or chest tightness and she has not had PND, orthopnea, or palpitations. She has chronic lower extremity edema that is unchanged.   Past Medical History:  Diagnosis Date  . Aortic stenosis   . Arthritis   . Bicuspid aortic valve   . Cervical lymphadenopathy   . H/O bronchitis 07/2013  . Hyperlipemia   . Hypertension   . PONV (postoperative nausea and vomiting)     Past Surgical History:  Procedure Laterality Date  . ABDOMINAL HYSTERECTOMY  1985  . CARPAL TUNNEL RELEASE  1985   rt hand  . CHOLECYSTECTOMY  01/03/2013   Procedure: LAPAROSCOPIC CHOLECYSTECTOMY attempted cholangiogram;  Surgeon: Ralene Ok, MD;  Location: [facility];  Service: General;;  . DILATION AND CURETTAGE OF UTERUS     several  . HAMMER TOE SURGERY  10/01/2011   left toe  . LYMPH NODE BIOPSY  06/14/2012   Procedure: LYMPH NODE BIOPSY;  Surgeon: Haywood Lasso, MD;  Location: [facility];  Service: General;  Laterality: Left;  remove left neck lymph node  . TONSILLECTOMY    . TUBAL LIGATION      Family History  Problem Relation Age of Onset  . Hypertension Mother   . Aortic stenosis Mother   . COPD Father   . Stroke Maternal Grandfather  Social History   Social History  . Marital status: Widowed    Spouse name: N/A  . Number of children: 1  . Years of education: N/A   Occupational History  . Not on file.   Social History Main Topics  . Smoking status: Never Smoker  . Smokeless tobacco: Never Used  . Alcohol use  Yes     Comment: occ  . Drug use: No  . Sexual activity: Not on file   Other Topics Concern  . Not on file   Social History Narrative  . No narrative on file    Current Outpatient Prescriptions  Medication Sig Dispense Refill  . acetaminophen (TYLENOL) 325 MG tablet Take 650 mg by mouth every 6 (six) hours as needed for pain.    Marland Kitchen aspirin 81 MG tablet Take 81 mg by mouth at bedtime.     Marland Kitchen b complex vitamins tablet Take 1 tablet by mouth daily.    . Biotin 5000 MCG TABS Take 5,000 mcg by mouth at bedtime.     . Calcium Carbonate-Vitamin D3 (CALCIUM 600+D3) 600-400 MG-UNIT TABS Take 1 tablet by mouth at bedtime.    . carvedilol (COREG) 6.25 MG tablet Take 6.25 mg by mouth 2 (two) times daily with a meal.     . Cholecalciferol (VITAMIN D) 2000 UNITS tablet Take 2,000 Units by mouth at bedtime.     Marland Kitchen CINNAMON PO Take 1,000 mg by mouth at bedtime.     . Coenzyme Q10 (CO Q-10) 300 MG CAPS Take 300 mg by mouth at bedtime.    . fluticasone (FLONASE) 50 MCG/ACT nasal spray INSTILL 2 SPRAYS IN EACH NOSTRIL EVERY DAY (Patient taking differently: INSTILL 2 SPRAYS IN EACH NOSTRIL DAILY AS NEEDED FOR CONGESTION) 48 g 3  . folic acid (FOLVITE) 161 MCG tablet Take 400 mcg by mouth at bedtime.     . hydrochlorothiazide (HYDRODIURIL) 25 MG tablet Take 25 mg by mouth at bedtime.     Marland Kitchen losartan (COZAAR) 100 MG tablet Take 100 mg by mouth at bedtime.     . Menthol-Methyl Salicylate (MUSCLE RUB EX) Apply 1 application topically daily as needed (muscle pain).    . pravastatin (PRAVACHOL) 20 MG tablet Take 20 mg by mouth at bedtime.      No current facility-administered medications for this visit.     Allergies  Allergen Reactions  . Penicillins Swelling    Has patient had a PCN reaction causing immediate rash, facial/tongue/throat swelling, SOB or lightheadedness with hypotension: Unknown Has patient had a PCN reaction causing severe rash involving mucus membranes or skin necrosis: Unknown Has patient  had a PCN reaction that required hospitalization No Has patient had a PCN reaction occurring within the last 10 years: No If all of the above answers are "NO", then may proceed with Cephalosporin use.   . Sulfa Antibiotics Hives      Review of Systems:   General:  normal appetite, normal energy, no weight gain, no weight loss, no fever  Cardiac:  no chest pain with exertion, no chest pain at rest, + SOB with exertion, no resting SOB, no PND, no orthopnea, no palpitations, no arrhythmia, no atrial fibrillation, + LE edema, + dizzy spells, no syncope  Respiratory:  Mild exertional shortness of breath, no home oxygen, no productive cough, no dry cough, + bronchitis in December, no wheezing, no hemoptysis, no asthma, no pain with inspiration or cough, no sleep apnea, no CPAP at night  GI:   no  difficulty swallowing, no reflux, no frequent heartburn, no hiatal hernia, n abdominal pain, no constipation, no diarrhea, no hematochezia, no hematemesis, no melena  Allen:   no dysuria,  no frequency, no urinary tract infection, no hematuria, no kidney stones, no kidney disease  Vascular:  no pain suggestive of claudication, no pain in feet, no leg cramps, no varicose veins, no DVT, no non-healing foot ulcer  Neuro:   no stroke, no TIA's, no seizures, no headaches, no temporary blindness one eye,  no slurred speech, no peripheral neuropathy, no chronic pain, no instability of gait, no memory/cognitive dysfunction  Musculoskeletal: + arthritis in right knee, no joint swelling, no myalgias, no difficulty walking, normal mobility   Skin:   no rash, no itching, no skin infections, no pressure sores or ulcerations  Psych:   no anxiety, no depression, no nervousness, no unusual recent stress  Eyes:   no blurry vision, no floaters, no recent vision changes, + wears glasses or contacts  ENT:   no hearing loss, no loose or painful teeth, no dentures, last saw dentist October 2017  Hematologic:  no easy bruising, no  abnormal bleeding, no clotting disorder, no frequent epistaxis  Endocrine:  no diabetes, does not check CBG's at home           Physical Exam:   BP 135/79   Pulse 73   Resp 20   Ht 5\' 5"  (1.651 m)   Wt 195 lb (88.5 kg)   SpO2 98% Comment: RA  BMI 32.45 kg/m   General:  Obese but o/w  well-appearing  HEENT:  Unremarkable   Neck:   no JVD, no bruits, no adenopathy   Chest:   clear to auscultation, symmetrical breath sounds, no wheezes, no rhonchi   CV:   RRR, grade III/VI crescendo/decrescendo murmur heard best at RUSB,  no diastolic murmur  Abdomen:  soft, non-tender, no masses   Extremities:  warm, well-perfused, pulses diminished, no LE edema  Rectal/Allen  Deferred  Neuro:   Grossly non-focal and symmetrical throughout  Skin:   Clean and dry, no rashes, no breakdown   Diagnostic Tests:  TRANSTHORACIC ECHOCARDIOGRAM  Both the images and the report from transthoracic echocardiogram performed 06/12/2016 at [facility] in [place] were reviewed. Patient has severe aortic stenosis with normal left ventricular systolic function. The aortic valve appears functionally bicuspid. There is severe thickening, calcification, and restricted leaflet mobility. Peak velocity across the aortic valve was measured 4.2 m/s corresponding to mean transvalvular gradient estimated 43 mmHg. Left ventricular ejection fraction was estimated 65-70%. There was mild mitral regurgitation. No other significant abnormalities were noted.    Right Heart Cath and Coronary Angiography  Conclusion   1. Widely patent coronary arteries (codominant RCA and left circumflex) 2. Known severe bicuspid valve aortic stenosis with heavy calcification and restriction of the aortic valve on plain fluoroscopy  Recommend:  Cardiac surgical evaluation for aortic valve replacement (appointment already scheduled)    Indications   Severe aortic stenosis [I35.0 (ICD-10-CM)]  Procedural Details/Technique    Technical Details INDICATION: Severe bicuspid aortic valve stenosis, Stage D disease (symptomatic with progressive exertional dyspnea), preoperative cath study  PROCEDURAL DETAILS: There was an indwelling IV in a right antecubital vein. Using normal sterile technique, the IV was changed out for a 5 Fr brachial sheath over a 0.018 inch wire. The right wrist was then prepped, draped, and anesthetized with 1% lidocaine. Using the modified Seldinger technique a 5/6 French Slender sheath was placed in the right  radial artery. Intra-arterial verapamil was administered through the radial artery sheath. IV heparin was administered after a JR4 catheter was advanced into the central aorta. A Swan-Ganz catheter was used for the right heart catheterization. Standard protocol was followed for recording of right heart pressures and sampling of oxygen saturations. Fick cardiac output was calculated. Standard Judkins catheters were used for selective coronary angiography. There were no immediate procedural complications. The patient was transferred to the post catheterization recovery area for further monitoring.     Estimated blood loss <50 mL.  During this procedure the patient was administered the following to achieve and maintain moderate conscious sedation: Versed mg, Fentanyl mcg, while the patient's heart rate, blood pressure, and oxygen saturation were continuously monitored.    Coronary Findings   Dominance: Co-dominant  Left Anterior Descending  Vessel is angiographically normal.  First Diagonal Branch  Vessel is angiographically normal.  Left Circumflex  Vessel is large. Vessel is angiographically normal.  Right Coronary Artery  Vessel is angiographically normal.  Left Heart   Aortic Valve The aortic valve is calcified. There is restricted aortic valve motion. The aortic valve is congenitally bicuspid.    Coronary Diagrams   Diagnostic Diagram       Implants     No implant  documentation for this case.  PACS Images   Show images for Cardiac catheterization   Link to Procedure Log   Procedure Log    Hemo Data    Most Recent Value  Fick Cardiac Output 7.67 L/min  Fick Cardiac Output Index 3.91 (L/min)/BSA  RA A Wave 8 mmHg  RA V Wave 7 mmHg  RA Mean 5 mmHg  RV Systolic Pressure 41 mmHg  RV Diastolic Pressure 1 mmHg  RV EDP 10 mmHg  PA Systolic Pressure 36 mmHg  PA Diastolic Pressure 14 mmHg  PA Mean 26 mmHg  PW A Wave 18 mmHg  PW V Wave 24 mmHg  PW Mean 16 mmHg  AO Systolic Pressure 476 mmHg  AO Diastolic Pressure 49 mmHg  AO Mean 68 mmHg  QP/QS 1.05  TPVR Index 6.31 HRUI  TSVR Index 17.39 HRUI  PVR SVR Ratio 0.15  TPVR/TSVR Ratio 0.36    CT ANGIOGRAPHY CHEST WITH CONTRAST  TECHNIQUE: Multidetector CT imaging of the chest was performed using the standard protocol during bolus administration of intravenous contrast. Multiplanar CT image reconstructions and MIPs were obtained to evaluate the vascular anatomy.  CONTRAST:  100 cc Isovue 370 IV.  COMPARISON:  12/27/2012 chest radiograph.   07/20/2012 PET-CT.  FINDINGS: Cardiovascular: Normal heart size. No significant pericardial fluid/thickening. No appreciable coronary artery calcifications. Aortic valvular calcifications are present. Mildly atherosclerotic nonaneurysmal thoracic aorta. Thoracic aorta measures 2.9 cm diameter at the aortic root. Ascending thoracic aorta measures 3.4 cm in maximum diameter. Aortic isthmus diameter 2.3 cm. Descending thoracic aortic diameter 2.1 cm. Normal caliber pulmonary arteries. No central pulmonary emboli.  Mediastinum/Nodes: No discrete thyroid nodules. Unremarkable esophagus. No pathologically enlarged axillary, mediastinal or hilar lymph nodes.  Lungs/Pleura: No pneumothorax. No pleural effusion. No acute consolidative airspace disease or lung masses. Solitary subpleural 2 mm solid superior segment left lower lobe pulmonary nodule  (series 5/ image 28). No additional significant pulmonary nodules.  Upper abdomen: Cholecystectomy.  Musculoskeletal: No aggressive appearing focal osseous lesions. Moderate thoracic spondylosis.  Review of the MIP images confirms the above findings.  IMPRESSION: 1. Aortic valvular calcifications, which correlate with aortic stenosis. 2. Aortic atherosclerosis. Thoracic aorta is normal in course and caliber. 3. Solitary  subpleural 2 mm pulmonary nodule in the superior segment left lower lobe. No follow-up needed if patient is low-risk. Non-contrast chest CT can be considered in 12 months if patient is high-risk. This recommendation follows the consensus statement: Guidelines for Management of Incidental Pulmonary Nodules Detected on CT Images: From the Fleischner Society 2017; Radiology 2017; 284:228-243.   Electronically Signed   By: Ilona Sorrel M.D.   On: 07/10/2016 08:23      STS Risk Calculator  Procedure    AVR  Risk of Mortality   1.8% Morbidity or Mortality  12.8% Prolonged LOS   4.9% Short LOS    40.9% Permanent Stroke   1.3% Prolonged Vent Support  8.4% DSW Infection    0.3% Renal Failure    2.7% Reoperation    5.6%    Impression:  Patient has stage D severe symptomatic aortic stenosis. She presents with symptoms of exertional shortness of breath and fatigue consistent with chronic diastolic congestive heart failure, New York Heart Association functional class I - II. She has also recently been experiencing intermittent dizzy spells without syncope.  I have personally reviewed the patient's recent transthoracic echocardiogram, diagnostic cardiac catheterization and CT angiogram. The aortic valve appears trileaflet but functionally bicuspid. The leaflets are thickened, partially calcified, and restricted. Peak velocity across the aortic valve was measured 4.2 m/s corresponding to mean transvalvular gradient estimated 43 mmHg. There is normal left  ventricular systolic function. Diagnostic cardiac catheterization is notable for the absence of significant coronary artery disease.  CT angiogram reveals normal size ascending thoracic aorta with no other complicating features.   Plan:  The patient and her close friend were counseled at length regarding treatment alternatives for management of severe aortic stenosis including continued medical therapy versus proceeding with aortic valve replacement in the near future.  The natural history of aortic stenosis was reviewed, as was long term prognosis with medical therapy alone.  Surgical options were discussed at length including conventional surgical aortic valve replacement through either a full median sternotomy or using minimally invasive techniques.  Other alternatives including rapid-deployment bioprosthetic tissue valve replacement, transcatheter aortic valve replacement, patch enlargement of the aortic root, stentless porcine aortic root replacement, valve repair, the Ross autograft procedure, and homograft aortic root replacement were discussed.  Discussion was held comparing the relative risks of mechanical valve replacement with need for lifelong anticoagulation versus use of a bioprosthetic tissue valve and the associated potential for late structural valve deterioration and failure.  This discussion was placed in the context of the patient's particular circumstances, and as a result the patient specifically requests that their valve be replaced using a bioprosthetic tissue valve .  The potential advantages and disadvantages associated with use of a rapid-deployment bioprosthetic aortic valve were discussed, including the risks of paravalvular leak, need for permanent pacemaker placement, and expectations for long-term durability.  The patient understands and accepts all potential associated risks of surgery including but not limited to risk of death, stroke, myocardial infarction, congestive heart  failure, respiratory failure, renal failure, pneumonia, bleeding requiring blood transfusion and or reexploration, arrhythmia, heart block or bradycardia requiring permanent pacemaker, aortic dissection or other major vascular complication, pleural effusions or other delayed complications related to continued congestive heart failure, and other late complications related to valve replacement including structural valve deterioration and failure, thrombosis, endocarditis, or paravalvular leak.  Expectations for the patient's postoperative convalescence at been discussed.  All of their questions have been answered. We plan to proceed with surgery on 09/17/2016.  The patient will return to our office for follow-up prior to surgery on 09/15/2016.   I spent in excess of 90 minutes during the conduct of this office consultation and >50% of this time involved direct face-to-face encounter with the patient for counseling and/or coordination of their care.    Jasmin Allen. Roxy Manns, MD 08/19/2016 4:19 PM

## 2016-08-20 ENCOUNTER — Other Ambulatory Visit: Payer: Self-pay

## 2016-08-20 DIAGNOSIS — I350 Nonrheumatic aortic (valve) stenosis: Secondary | ICD-10-CM

## 2016-09-15 ENCOUNTER — Ambulatory Visit (HOSPITAL_COMMUNITY)
Admission: RE | Admit: 2016-09-15 | Discharge: 2016-09-15 | Disposition: A | Discharge status: Home / Self Care | Payer: Medicare Other | Source: Ambulatory Visit | Attending: Thoracic Surgery (Cardiothoracic Vascular Surgery) | Admitting: Thoracic Surgery (Cardiothoracic Vascular Surgery)

## 2016-09-15 ENCOUNTER — Encounter (HOSPITAL_COMMUNITY)
Admission: RE | Admit: 2016-09-15 | Discharge: 2016-09-15 | Disposition: A | Discharge status: Home / Self Care | Payer: Medicare Other | Source: Ambulatory Visit | Attending: Thoracic Surgery (Cardiothoracic Vascular Surgery) | Admitting: Thoracic Surgery (Cardiothoracic Vascular Surgery)

## 2016-09-15 ENCOUNTER — Ambulatory Visit (INDEPENDENT_AMBULATORY_CARE_PROVIDER_SITE_OTHER): Payer: Medicare Other | Admitting: Thoracic Surgery (Cardiothoracic Vascular Surgery)

## 2016-09-15 ENCOUNTER — Encounter: Payer: Self-pay | Admitting: Thoracic Surgery (Cardiothoracic Vascular Surgery)

## 2016-09-15 ENCOUNTER — Encounter (HOSPITAL_COMMUNITY): Payer: Self-pay

## 2016-09-15 ENCOUNTER — Ambulatory Visit (HOSPITAL_BASED_OUTPATIENT_CLINIC_OR_DEPARTMENT_OTHER)
Admission: RE | Admit: 2016-09-15 | Discharge: 2016-09-15 | Disposition: A | Discharge status: Home / Self Care | Payer: Medicare Other | Source: Ambulatory Visit | Attending: Thoracic Surgery (Cardiothoracic Vascular Surgery) | Admitting: Thoracic Surgery (Cardiothoracic Vascular Surgery)

## 2016-09-15 VITALS — BP 134/80 | HR 77 | Resp 20 | Ht 65.0 in | Wt 202.0 lb

## 2016-09-15 DIAGNOSIS — J9811 Atelectasis: Secondary | ICD-10-CM | POA: Diagnosis not present

## 2016-09-15 DIAGNOSIS — I350 Nonrheumatic aortic (valve) stenosis: Secondary | ICD-10-CM

## 2016-09-15 DIAGNOSIS — Z01818 Encounter for other preprocedural examination: Secondary | ICD-10-CM

## 2016-09-15 DIAGNOSIS — E669 Obesity, unspecified: Secondary | ICD-10-CM | POA: Diagnosis present

## 2016-09-15 DIAGNOSIS — D62 Acute posthemorrhagic anemia: Secondary | ICD-10-CM | POA: Diagnosis not present

## 2016-09-15 DIAGNOSIS — Z01810 Encounter for preprocedural cardiovascular examination: Secondary | ICD-10-CM

## 2016-09-15 DIAGNOSIS — D6959 Other secondary thrombocytopenia: Secondary | ICD-10-CM | POA: Diagnosis not present

## 2016-09-15 DIAGNOSIS — Z79899 Other long term (current) drug therapy: Secondary | ICD-10-CM

## 2016-09-15 DIAGNOSIS — Q231 Congenital insufficiency of aortic valve: Secondary | ICD-10-CM

## 2016-09-15 DIAGNOSIS — J90 Pleural effusion, not elsewhere classified: Secondary | ICD-10-CM | POA: Diagnosis not present

## 2016-09-15 DIAGNOSIS — I6522 Occlusion and stenosis of left carotid artery: Secondary | ICD-10-CM

## 2016-09-15 DIAGNOSIS — Z01812 Encounter for preprocedural laboratory examination: Secondary | ICD-10-CM | POA: Insufficient documentation

## 2016-09-15 DIAGNOSIS — I5032 Chronic diastolic (congestive) heart failure: Secondary | ICD-10-CM | POA: Diagnosis present

## 2016-09-15 DIAGNOSIS — E785 Hyperlipidemia, unspecified: Secondary | ICD-10-CM | POA: Diagnosis present

## 2016-09-15 DIAGNOSIS — R112 Nausea with vomiting, unspecified: Secondary | ICD-10-CM | POA: Diagnosis not present

## 2016-09-15 DIAGNOSIS — R911 Solitary pulmonary nodule: Secondary | ICD-10-CM | POA: Diagnosis present

## 2016-09-15 DIAGNOSIS — M47814 Spondylosis without myelopathy or radiculopathy, thoracic region: Secondary | ICD-10-CM | POA: Diagnosis present

## 2016-09-15 DIAGNOSIS — I110 Hypertensive heart disease with heart failure: Secondary | ICD-10-CM | POA: Diagnosis present

## 2016-09-15 DIAGNOSIS — Z6834 Body mass index (BMI) 34.0-34.9, adult: Secondary | ICD-10-CM

## 2016-09-15 DIAGNOSIS — I700 Atherosclerosis of aorta: Secondary | ICD-10-CM | POA: Diagnosis present

## 2016-09-15 DIAGNOSIS — Y838 Other surgical procedures as the cause of abnormal reaction of the patient, or of later complication, without mention of misadventure at the time of the procedure: Secondary | ICD-10-CM | POA: Diagnosis not present

## 2016-09-15 DIAGNOSIS — Z951 Presence of aortocoronary bypass graft: Secondary | ICD-10-CM

## 2016-09-15 DIAGNOSIS — Z880 Allergy status to penicillin: Secondary | ICD-10-CM

## 2016-09-15 DIAGNOSIS — T8131XA Disruption of external operation (surgical) wound, not elsewhere classified, initial encounter: Secondary | ICD-10-CM | POA: Diagnosis not present

## 2016-09-15 HISTORY — DX: Inflammatory liver disease, unspecified: K75.9

## 2016-09-15 LAB — PULMONARY FUNCTION TEST
DL/VA % pred: 72 %
DL/VA: 3.56 ml/min/mmHg/L
DLCO unc % pred: 62 %
DLCO unc: 16.06 ml/min/mmHg
FEF 25-75 Post: 1.84 L/sec
FEF 25-75 Pre: 1.7 L/sec
FEF2575-%CHANGE-POST: 8 %
FEF2575-%PRED-POST: 105 %
FEF2575-%Pred-Pre: 97 %
FEV1-%CHANGE-POST: 2 %
FEV1-%PRED-PRE: 89 %
FEV1-%Pred-Post: 91 %
FEV1-POST: 2.04 L
FEV1-Pre: 1.98 L
FEV1FVC-%CHANGE-POST: 5 %
FEV1FVC-%Pred-Pre: 102 %
FEV6-%Change-Post: -1 %
FEV6-%PRED-POST: 89 %
FEV6-%Pred-Pre: 90 %
FEV6-Post: 2.5 L
FEV6-Pre: 2.53 L
FEV6FVC-%Change-Post: 1 %
FEV6FVC-%PRED-PRE: 103 %
FEV6FVC-%Pred-Post: 105 %
FVC-%CHANGE-POST: -3 %
FVC-%PRED-POST: 84 %
FVC-%PRED-PRE: 87 %
FVC-POST: 2.5 L
FVC-Pre: 2.58 L
POST FEV1/FVC RATIO: 81 %
Post FEV6/FVC ratio: 100 %
Pre FEV1/FVC ratio: 77 %
Pre FEV6/FVC Ratio: 98 %
RV % PRED: 123 %
RV: 2.87 L
TLC % pred: 104 %
TLC: 5.45 L

## 2016-09-15 LAB — ABO/RH: ABO/RH(D): O POS

## 2016-09-15 LAB — URINALYSIS, ROUTINE W REFLEX MICROSCOPIC
BACTERIA UA: NONE SEEN
BILIRUBIN URINE: NEGATIVE
Glucose, UA: NEGATIVE mg/dL
Hgb urine dipstick: NEGATIVE
KETONES UR: NEGATIVE mg/dL
Nitrite: NEGATIVE
PH: 5 (ref 5.0–8.0)
Protein, ur: NEGATIVE mg/dL
Specific Gravity, Urine: 1.018 (ref 1.005–1.030)

## 2016-09-15 LAB — COMPREHENSIVE METABOLIC PANEL
ALBUMIN: 4 g/dL (ref 3.5–5.0)
ALK PHOS: 75 U/L (ref 38–126)
ALT: 19 U/L (ref 14–54)
AST: 20 U/L (ref 15–41)
Anion gap: 10 (ref 5–15)
BUN: 17 mg/dL (ref 6–20)
CALCIUM: 9.3 mg/dL (ref 8.9–10.3)
CO2: 24 mmol/L (ref 22–32)
CREATININE: 0.82 mg/dL (ref 0.44–1.00)
Chloride: 107 mmol/L (ref 101–111)
GFR calc non Af Amer: >60 mL/min (ref >60)
Glucose, Bld: 117 mg/dL — ABNORMAL HIGH (ref 65–99)
Potassium: 3.6 mmol/L (ref 3.5–5.1)
SODIUM: 141 mmol/L (ref 135–145)
TOTAL PROTEIN: 6.2 g/dL — AB (ref 6.5–8.1)
Total Bilirubin: 0.9 mg/dL (ref 0.3–1.2)

## 2016-09-15 LAB — CBC
HCT: 33.9 % — ABNORMAL LOW (ref 36.0–46.0)
HEMOGLOBIN: 12 g/dL (ref 12.0–15.0)
MCH: 33.4 pg (ref 26.0–34.0)
MCHC: 35.4 g/dL (ref 30.0–36.0)
MCV: 94.4 fL (ref 78.0–100.0)
PLATELETS: 145 10*3/uL — AB (ref 150–400)
RBC: 3.59 MIL/uL — AB (ref 3.87–5.11)
RDW: 12.8 % (ref 11.5–15.5)
WBC: 5.1 10*3/uL (ref 4.0–10.5)

## 2016-09-15 LAB — PROTIME-INR
INR: 0.95
PROTHROMBIN TIME: 12.7 s (ref 11.4–15.2)

## 2016-09-15 LAB — SURGICAL PCR SCREEN
MRSA, PCR: NEGATIVE
STAPHYLOCOCCUS AUREUS: NEGATIVE

## 2016-09-15 LAB — APTT: APTT: 24 s (ref 24–36)

## 2016-09-15 MED ORDER — ALBUTEROL SULFATE (2.5 MG/3ML) 0.083% IN NEBU
2.5000 mg | INHALATION_SOLUTION | Freq: Once | RESPIRATORY_TRACT | Status: AC
  Administered 2016-09-15: 2.5 mg via RESPIRATORY_TRACT

## 2016-09-15 NOTE — H&P (Signed)
[address]       [place] [address]             [phone]          CARDIOTHORACIC SURGERY HISTORY AND PHYSICAL EXAM  Referring Provider is Stanford Breed, Denice Bors, MD PCP is Lonia Mad, MD      Chief Complaint  Patient presents with  . Aortic Stenosis    Surgical eval for possible AVR, Cardiac Cath 08/14/16, ECHO 08/18/16, CTA Chest 07/09/16     HPI:  Patient is a 76 year old female with history of bicuspid aortic valve and aortic stenosis referred for elective surgical consultation. The patient states that she was first noted to have a heart murmur on routine physical exam nearly 20 years ago. She has been followed by her primary care physician with echocardiograms which have demonstrated gradual progression of aortic stenosis. Transthoracic echocardiogram performed in 2016 reportedly demonstrated severe aortic stenosis. At the time the patient was relatively asymptomatic and weight loss was recommended. Recently the patient was seen in follow-up by her primary care physician and noted to report mild symptoms of exertional shortness of breath with occasional dizzy spells.  Follow-up echocardiogram performed at [facility] in [place] revealed severe aortic stenosis with normal left ventricular systolic function. Peak velocity across the aortic valve was reported 4.2 m/s corresponding to mean transvalvular gradient 43 mmHg. Findings were reportedly similar to the echocardiogram performed in 2016. She was seen in consultation by Dr. Stanford Breed and subsequently scheduled for diagnostic cardiac catheterization. Catheterization performed by Dr. Burt Knack revealed normal coronary arteries with no significant coronary artery disease.  The patient has subsequently been referred for elective surgical consultation.  The patient is widowed and lives in a patio home community in [place]. 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 She has  remained reasonably active and functionally completely independent, although she has been obese for most of her adult life. She drives an automobile and tends to her own personal needs.  She states that she has noted mild symptoms of exertional shortness of breath and fatigue that occur with more strenuous exertion, such as walking at a brisk pace. The cold weather has seemed to make symptoms a little worse. She has also recently developed some intermittent dizzy spells without any history of syncope. She denies any chest pain or chest tightness and she has not had PND, orthopnea, or palpitations. She has chronic lower extremity edema that is unchanged.  Patient returns to the office today for follow-up of severe symptomatic aortic stenosis with tentative plans to proceed with aortic valve replacement later this week. She was originally seen in consultation on 08/19/2016. Since then she reports no new problems or complaints. She reports stable symptoms of mild exertional shortness of breath that occur with moderate level activity. She has not had resting shortness breath, PND, orthopnea, or lower extremity edema. She has never had any chest pain or chest tightness. She is not having dizzy spells. She denies any fevers chills or productive cough. She is looking forward to getting the surgery put behind her. She has looked into possible facilities for her to go following hospital discharge for rehabilitation and picked out a place in [place].      Past Medical History:  Diagnosis Date  . Aortic stenosis   . Arthritis   . Bicuspid aortic valve   . Cervical lymphadenopathy   . H/O bronchitis 07/2013  . Heart murmur   .  Hepatitis    JAUNDICE  1960  . Hyperlipemia   . Hypertension   . PONV (postoperative nausea and vomiting)     Past Surgical History:  Procedure Laterality Date  . ABDOMINAL HYSTERECTOMY  1985  . CARPAL TUNNEL RELEASE  1985   rt hand  . CHOLECYSTECTOMY  01/03/2013    Procedure: LAPAROSCOPIC CHOLECYSTECTOMY attempted cholangiogram;  Surgeon: Ralene Ok, MD;  Location: [facility];  Service: General;;  . DILATION AND CURETTAGE OF UTERUS     several  . HAMMER TOE SURGERY  10/01/2011   left toe  . LYMPH NODE BIOPSY  06/14/2012   Procedure: LYMPH NODE BIOPSY;  Surgeon: Haywood Lasso, MD;  Location: [facility];  Service: General;  Laterality: Left;  remove left neck lymph node  . TONSILLECTOMY    . TUBAL LIGATION      Family History  Problem Relation Age of Onset  . Hypertension Mother   . Aortic stenosis Mother   . COPD Father   . Stroke Maternal Grandfather     Social History Social History  Substance Use Topics  . Smoking status: Never Smoker  . Smokeless tobacco: Never Used  . Alcohol use Yes     Comment: occ    Prior to Admission medications   Medication Sig Start Date End Date Taking? Authorizing Provider  B Complex-C (B-COMPLEX WITH VITAMIN C) tablet Take 1 tablet by mouth every evening.   Yes Historical Provider, MD  Calcium Carbonate-Vitamin D3 (CALCIUM 600+D3) 600-400 MG-UNIT TABS Take 1 tablet by mouth at bedtime.   Yes Historical Provider, MD  carvedilol (COREG) 6.25 MG tablet Take 6.25 mg by mouth 2 (two) times daily with a meal.  02/14/12  Yes Historical Provider, MD  Cholecalciferol (VITAMIN D) 2000 UNITS tablet Take 2,000 Units by mouth at bedtime.    Yes Historical Provider, MD  Coenzyme Q10 (CO Q-10) 300 MG CAPS Take 300 mg by mouth at bedtime.   Yes Historical Provider, MD  fluticasone (FLONASE) 50 MCG/ACT nasal spray INSTILL 2 SPRAYS IN EACH NOSTRIL EVERY DAY Patient taking differently: INSTILL 2 SPRAYS IN EACH NOSTRIL DAILY AS NEEDED FOR CONGESTION 07/20/13  Yes Farrel Gobble, MD  folic acid (FOLVITE) 638 MCG tablet Take 400 mcg by mouth at bedtime.    Yes Historical Provider, MD  hydrochlorothiazide (HYDRODIURIL) 25 MG tablet Take 25 mg by mouth at bedtime.  01/23/12  Yes Historical Provider, MD  loratadine  (CLARITIN) 10 MG tablet Take 10 mg by mouth daily as needed for allergies.   Yes Historical Provider, MD  losartan (COZAAR) 100 MG tablet Take 100 mg by mouth at bedtime.  01/23/12  Yes Historical Provider, MD  Menthol-Methyl Salicylate (MUSCLE RUB EX) Apply 1 application topically daily as needed (muscle pain).   Yes Historical Provider, MD  pravastatin (PRAVACHOL) 20 MG tablet Take 20 mg by mouth at bedtime.  02/14/12  Yes Historical Provider, MD  vitamin B-12 (CYANOCOBALAMIN) 1000 MCG tablet Take 1,000 mcg by mouth every evening.   Yes Historical Provider, MD  acetaminophen (TYLENOL) 650 MG CR tablet Take 650 mg by mouth every 8 (eight) hours as needed for pain. Has stopped prior to procedure    Historical Provider, MD    Allergies  Allergen Reactions  . Penicillins Swelling    Has patient had a PCN reaction causing immediate rash, facial/tongue/throat swelling, SOB or lightheadedness with hypotension: Unknown Has patient had a PCN reaction causing severe rash involving mucus membranes or skin necrosis: Unknown Has  patient had a PCN reaction that required hospitalization No Has patient had a PCN reaction occurring within the last 10 years: No If all of the above answers are "NO", then may proceed with Cephalosporin use.   . Sulfa Antibiotics Hives     Review of Systems:              General:                      normal appetite, normal energy, no weight gain, no weight loss, no fever             Cardiac:                       no chest pain with exertion, no chest pain at rest, + SOB with exertion, no resting SOB, no PND, no orthopnea, no palpitations, no arrhythmia, no atrial fibrillation, + LE edema, + dizzy spells, no syncope             Respiratory:                 Mild exertional shortness of breath, no home oxygen, no productive cough, no dry cough, + bronchitis in December, no wheezing, no hemoptysis, no asthma, no pain with inspiration or cough, no sleep apnea, no CPAP at night              GI:                               no difficulty swallowing, no reflux, no frequent heartburn, no hiatal hernia, n abdominal pain, no constipation, no diarrhea, no hematochezia, no hematemesis, no melena             GU:                              no dysuria,  no frequency, no urinary tract infection, no hematuria, no kidney stones, no kidney disease             Vascular:                     no pain suggestive of claudication, no pain in feet, no leg cramps, no varicose veins, no DVT, no non-healing foot ulcer             Neuro:                         no stroke, no TIA's, no seizures, no headaches, no temporary blindness one eye,  no slurred speech, no peripheral neuropathy, no chronic pain, no instability of gait, no memory/cognitive dysfunction             Musculoskeletal:         + arthritis in right knee, no joint swelling, no myalgias, no difficulty walking, normal mobility              Skin:                            no rash, no itching, no skin infections, no pressure sores or ulcerations             Psych:  no anxiety, no depression, no nervousness, no unusual recent stress             Eyes:                           no blurry vision, no floaters, no recent vision changes, + wears glasses or contacts             ENT:                            no hearing loss, no loose or painful teeth, no dentures, last saw dentist October 2017             Hematologic:               no easy bruising, no abnormal bleeding, no clotting disorder, no frequent epistaxis             Endocrine:                   no diabetes, does not check CBG's at home                                                       Physical Exam:              BP 135/79   Pulse 73   Resp 20   Ht 5\' 5"  (1.651 m)   Wt 195 lb (88.5 kg)   SpO2 98% Comment: RA  BMI 32.45 kg/m              General:                      Obese but o/w  well-appearing             HEENT:                        Unremarkable              Neck:                           no JVD, no bruits, no adenopathy              Chest:                          clear to auscultation, symmetrical breath sounds, no wheezes, no rhonchi              CV:                              RRR, grade III/VI crescendo/decrescendo murmur heard best at RUSB,  no diastolic murmur             Abdomen:                    soft, non-tender, no masses              Extremities:                 warm, well-perfused, pulses diminished, no LE edema  Rectal/GU                   Deferred             Neuro:                         Grossly non-focal and symmetrical throughout             Skin:                            Clean and dry, no rashes, no breakdown   Diagnostic Tests:  TRANSTHORACIC ECHOCARDIOGRAM  Both the images and the report from transthoracic echocardiogram performed 06/12/2016 at [facility] in [place] were reviewed. Patient has severe aortic stenosis with normal left ventricular systolic function. The aortic valve appears functionally bicuspid. There is severe thickening, calcification, and restricted leaflet mobility. Peak velocity across the aortic valve was measured 4.2 m/s corresponding to mean transvalvular gradient estimated 43 mmHg. Left ventricular ejection fraction was estimated 65-70%. There was mild mitral regurgitation. No other significant abnormalities were noted.    Right Heart Cath and Coronary Angiography  Conclusion   1. Widely patent coronary arteries (codominant RCA and left circumflex) 2. Known severe bicuspid valve aortic stenosis with heavy calcification and restriction of the aortic valve on plain fluoroscopy  Recommend:  Cardiac surgical evaluation for aortic valve replacement (appointment already scheduled)    Indications   Severe aortic stenosis [I35.0 (ICD-10-CM)]  Procedural Details/Technique   Technical Details INDICATION: Severe bicuspid aortic valve  stenosis, Stage D disease (symptomatic with progressive exertional dyspnea), preoperative cath study  PROCEDURAL DETAILS: There was an indwelling IV in a right antecubital vein. Using normal sterile technique, the IV was changed out for a 5 Fr brachial sheath over a 0.018 inch wire. The right wrist was then prepped, draped, and anesthetized with 1% lidocaine. Using the modified Seldinger technique a 5/6 French Slender sheath was placed in the right radial artery. Intra-arterial verapamil was administered through the radial artery sheath. IV heparin was administered after a JR4 catheter was advanced into the central aorta. A Swan-Ganz catheter was used for the right heart catheterization. Standard protocol was followed for recording of right heart pressures and sampling of oxygen saturations. Fick cardiac output was calculated. Standard Judkins catheters were used for selective coronary angiography. There were no immediate procedural complications. The patient was transferred to the post catheterization recovery area for further monitoring.     Estimated blood loss <50 mL.  During this procedure the patient was administered the following to achieve and maintain moderate conscious sedation: Versed mg, Fentanyl mcg, while the patient's heart rate, blood pressure, and oxygen saturation were continuously monitored.    Coronary Findings   Dominance: Co-dominant  Left Anterior Descending  Vessel is angiographically normal.  First Diagonal Branch  Vessel is angiographically normal.  Left Circumflex  Vessel is large. Vessel is angiographically normal.  Right Coronary Artery  Vessel is angiographically normal.  Left Heart   Aortic Valve The aortic valve is calcified. There is restricted aortic valve motion. The aortic valve is congenitally bicuspid.    Coronary Diagrams   Diagnostic Diagram       Implants        No implant documentation for this case.  PACS Images   Show images for  Cardiac catheterization   Link to Procedure Log   Procedure Log    Hemo Data  Most Recent Value  Fick Cardiac Output 7.67 L/min  Fick Cardiac Output Index 3.91 (L/min)/BSA  RA A Wave 8 mmHg  RA V Wave 7 mmHg  RA Mean 5 mmHg  RV Systolic Pressure 41 mmHg  RV Diastolic Pressure 1 mmHg  RV EDP 10 mmHg  PA Systolic Pressure 36 mmHg  PA Diastolic Pressure 14 mmHg  PA Mean 26 mmHg  PW A Wave 18 mmHg  PW V Wave 24 mmHg  PW Mean 16 mmHg  AO Systolic Pressure 073 mmHg  AO Diastolic Pressure 49 mmHg  AO Mean 68 mmHg  QP/QS 1.05  TPVR Index 6.31 HRUI  TSVR Index 17.39 HRUI  PVR SVR Ratio 0.15  TPVR/TSVR Ratio 0.36    CT ANGIOGRAPHY CHEST WITH CONTRAST  TECHNIQUE: Multidetector CT imaging of the chest was performed using the standard protocol during bolus administration of intravenous contrast. Multiplanar CT image reconstructions and MIPs were obtained to evaluate the vascular anatomy.  CONTRAST: 100 cc Isovue 370 IV.  COMPARISON: 12/27/2012 chest radiograph. 07/20/2012 PET-CT.  FINDINGS: Cardiovascular: Normal heart size. No significant pericardial fluid/thickening. No appreciable coronary artery calcifications. Aortic valvular calcifications are present. Mildly atherosclerotic nonaneurysmal thoracic aorta. Thoracic aorta measures 2.9 cm diameter at the aortic root. Ascending thoracic aorta measures 3.4 cm in maximum diameter. Aortic isthmus diameter 2.3 cm. Descending thoracic aortic diameter 2.1 cm. Normal caliber pulmonary arteries. No central pulmonary emboli.  Mediastinum/Nodes: No discrete thyroid nodules. Unremarkable esophagus. No pathologically enlarged axillary, mediastinal or hilar lymph nodes.  Lungs/Pleura: No pneumothorax. No pleural effusion. No acute consolidative airspace disease or lung masses. Solitary subpleural 2 mm solid superior segment left lower lobe pulmonary nodule (series 5/ image 28). No additional significant pulmonary  nodules.  Upper abdomen: Cholecystectomy.  Musculoskeletal: No aggressive appearing focal osseous lesions. Moderate thoracic spondylosis.  Review of the MIP images confirms the above findings.  IMPRESSION: 1. Aortic valvular calcifications, which correlate with aortic stenosis. 2. Aortic atherosclerosis. Thoracic aorta is normal in course and caliber. 3. Solitary subpleural 2 mm pulmonary nodule in the superior segment left lower lobe. No follow-up needed if patient is low-risk. Non-contrast chest CT can be considered in 12 months if patient is high-risk. This recommendation follows the consensus statement: Guidelines for Management of Incidental Pulmonary Nodules Detected on CT Images: From the Fleischner Society 2017; Radiology 2017; 284:228-243.   Electronically Signed By: Ilona Sorrel M.D. On: 07/10/2016 08:23      STS Risk Calculator  Procedure                                          AVR  Risk of Mortality                                1.8% Morbidity or Mortality                       12.8% Prolonged LOS                                   4.9% Short LOS  40.9% Permanent Stroke                             1.3% Prolonged Vent Support                     8.4% DSW Infection                                     0.3% Renal Failure                                       2.7% Reoperation                                        5.6%   Impression:  Patient has stage D severe symptomatic aortic stenosis. She presents with symptoms of exertional shortness of breath and fatigue consistent with chronic diastolic congestive heart failure, New York Heart Association functional class I - II. She has also recently been experiencing intermittent dizzy spells without syncope. I have personally reviewed the patient's recent transthoracic echocardiogram,diagnostic cardiac catheterization and CT angiogram. The aortic valve  appears trileaflet but functionally bicuspid. The leaflets are thickened, partially calcified, and restricted. Peak velocity across the aortic valve was measured 4.2 m/s corresponding to mean transvalvular gradient estimated 43 mmHg. There is normal left ventricular systolic function. Diagnostic cardiac catheterization is notable for the absence of significant coronary artery disease. CT angiogram reveals normal size ascending thoracic aorta with no other complicating features.   Plan:  I have again reviewed the indications, risks, and potential benefits of surgery with the patient and the office today. Expectations for postoperative convalescence at been discussed. We plan to proceed with aortic valve replacement using a rapid deployment bioprosthetic tissue valve. Partial upper mini sternotomy on Wednesday, 09/17/2016.  All of her questions have been addressed.   I spent in excess of 15 minutes during the conduct of this office consultation and >50% of this time involved direct face-to-face encounter with the patient for counseling and/or coordination of their care.    Valentina Gu. Roxy Manns, MD 09/15/2016

## 2016-09-15 NOTE — Progress Notes (Signed)
[address]       [place] [address]             [phone]     CARDIOTHORACIC SURGERY OFFICE NOTE  Referring Provider is Stanford Breed Denice Bors, MD PCP is Lonia Mad, MD   HPI:  Patient returns to the office today for follow-up of severe symptomatic aortic stenosis with tentative plans to proceed with aortic valve replacement later this week. She was originally seen in consultation on 08/19/2016. Since then she reports no new problems or complaints. She reports stable symptoms of mild exertional shortness of breath that occur with moderate level activity. She has not had resting shortness breath, PND, orthopnea, or lower extremity edema. She has never had any chest pain or chest tightness. She is not having dizzy spells. She denies any fevers chills or productive cough. She is looking forward to getting the surgery put behind her. She has looked into possible facilities for her to go following hospital discharge for rehabilitation and picked out a place in [place].   Current Outpatient Prescriptions  Medication Sig Dispense Refill  . acetaminophen (TYLENOL) 650 MG CR tablet Take 650 mg by mouth every 8 (eight) hours as needed for pain. Has stopped prior to procedure    . B Complex-C (B-COMPLEX WITH VITAMIN C) tablet Take 1 tablet by mouth every evening.    . Calcium Carbonate-Vitamin D3 (CALCIUM 600+D3) 600-400 MG-UNIT TABS Take 1 tablet by mouth at bedtime.    . carvedilol (COREG) 6.25 MG tablet Take 6.25 mg by mouth 2 (two) times daily with a meal.     . Cholecalciferol (VITAMIN D) 2000 UNITS tablet Take 2,000 Units by mouth at bedtime.     . Coenzyme Q10 (CO Q-10) 300 MG CAPS Take 300 mg by mouth at bedtime.    . fluticasone (FLONASE) 50 MCG/ACT nasal spray INSTILL 2 SPRAYS IN EACH NOSTRIL EVERY DAY (Patient taking differently: INSTILL 2 SPRAYS IN EACH NOSTRIL DAILY AS NEEDED FOR CONGESTION) 48 g 3  . folic acid (FOLVITE) 751 MCG tablet Take 400  mcg by mouth at bedtime.     . hydrochlorothiazide (HYDRODIURIL) 25 MG tablet Take 25 mg by mouth at bedtime.     Marland Kitchen loratadine (CLARITIN) 10 MG tablet Take 10 mg by mouth daily as needed for allergies.    Marland Kitchen losartan (COZAAR) 100 MG tablet Take 100 mg by mouth at bedtime.     . Menthol-Methyl Salicylate (MUSCLE RUB EX) Apply 1 application topically daily as needed (muscle pain).    . pravastatin (PRAVACHOL) 20 MG tablet Take 20 mg by mouth at bedtime.     . vitamin B-12 (CYANOCOBALAMIN) 1000 MCG tablet Take 1,000 mcg by mouth every evening.     No current facility-administered medications for this visit.       Physical Exam:   BP 134/80   Pulse 77   Resp 20   Ht 5\' 5"  (1.651 m)   Wt 202 lb (91.6 kg)   SpO2 99% Comment: RA  BMI 33.61 kg/m   General:  Obese but well appearing  Chest:   Clear to auscultation  CV:   Regular rate and rhythm with prominent systolic murmur  Incisions:  n/a  Abdomen:  Soft nontender  Extremities:  Warm and well-perfused  Diagnostic Tests:  CHEST  2 VIEW  COMPARISON:  Chest radiograph August 17, 2016  FINDINGS: Cardiomediastinal silhouette is normal. No pleural effusions or focal consolidations. Trachea projects midline and  there is no pneumothorax. Soft tissue planes and included osseous structures are non-suspicious. Surgical clips in the included right abdomen compatible with cholecystectomy. Mild degenerative change of the thoracic spine. Moderate cervical facet arthropathy partially characterized.  IMPRESSION: Stable examination:  No acute cardiopulmonary process.   Electronically Signed   By: Elon Alas M.D.   On: 09/15/2016 15:04   Impression:  Patient has stage D severe symptomatic aortic stenosis. She presents with symptoms of exertional shortness of breath and fatigue consistent with chronic diastolic congestive heart failure, New York Heart Association functional class I - II. She has also recently been experiencing  intermittent dizzy spells without syncope.  I have personally reviewed the patient's recent transthoracic echocardiogram, diagnostic cardiac catheterization and CT angiogram. The aortic valve appears trileaflet but functionally bicuspid. The leaflets are thickened, partially calcified, and restricted. Peak velocity across the aortic valve was measured 4.2 m/s corresponding to mean transvalvular gradient estimated 43 mmHg. There is normal left ventricular systolic function. Diagnostic cardiac catheterization is notable for the absence of significant coronary artery disease.  CT angiogram reveals normal size ascending thoracic aorta with no other complicating features.   Plan:  I have again reviewed the indications, risks, and potential benefits of surgery with the patient and the office today. Expectations for postoperative convalescence at been discussed. We plan to proceed with aortic valve replacement using a rapid deployment bioprosthetic tissue valve. Partial upper mini sternotomy on Wednesday, 09/17/2016.  All of her questions have been addressed.   I spent in excess of 15 minutes during the conduct of this office consultation and >50% of this time involved direct face-to-face encounter with the patient for counseling and/or coordination of their care.    Valentina Gu. Roxy Manns, MD 09/15/2016 4:06 PM

## 2016-09-15 NOTE — Progress Notes (Signed)
VASCULAR LAB PRELIMINARY  PRELIMINARY  PRELIMINARY  PRELIMINARY  Pre-op Cardiac Surgery  Carotid Findings:  Right  - No evidence of significant ICA stenosis. Left - 1% to 39% ICA stenosis lower end of scale. Bilateral - Vertebral artery flow is antegrade.  Upper Extremity Right Left  Brachial Pressures 117 Triphsic 119 Triphasic  Radial Waveforms Triphasic Triphasic  Ulnar Waveforms Triphasic Triphasic  Palmar Arch (Allen's Test) Normal Normal   Findings:  Doppler waveforms remained normal bilaterally with both radial and ulnar compressions.   Safaa Stingley, RVS 09/15/2016, 2:54 PM

## 2016-09-15 NOTE — Patient Instructions (Signed)
   Continue taking all current medications without change through the day before surgery.  Have nothing to eat or drink after midnight the night before surgery.  On the morning of surgery take only Coreg with a sip of water.

## 2016-09-16 ENCOUNTER — Encounter (HOSPITAL_COMMUNITY): Payer: Self-pay | Admitting: Certified Registered Nurse Anesthetist

## 2016-09-16 LAB — BLOOD GAS, ARTERIAL
Acid-Base Excess: 2.3 mmol/L — ABNORMAL HIGH (ref 0.0–2.0)
BICARBONATE: 25.4 mmol/L (ref 20.0–28.0)
Drawn by: 470591
FIO2: 21
O2 Saturation: 99.4 %
PATIENT TEMPERATURE: 98.6
PO2 ART: 142 mmHg — AB (ref 83.0–108.0)
pCO2 arterial: 32.7 mmHg (ref 32.0–48.0)
pH, Arterial: 7.502 — ABNORMAL HIGH (ref 7.350–7.450)

## 2016-09-16 LAB — VAS US DOPPLER PRE CABG
LCCAPDIAS: 21 cm/s
LEFT ECA DIAS: -10 cm/s
LEFT VERTEBRAL DIAS: -12 cm/s
LICAPDIAS: -22 cm/s
Left CCA dist dias: -13 cm/s
Left CCA dist sys: -68 cm/s
Left CCA prox sys: 99 cm/s
Left ICA dist dias: -24 cm/s
Left ICA dist sys: -78 cm/s
Left ICA prox sys: -97 cm/s
RIGHT ECA DIAS: -8 cm/s
RIGHT VERTEBRAL DIAS: -10 cm/s
Right CCA prox dias: 29 cm/s
Right CCA prox sys: 111 cm/s
Right cca dist sys: -92 cm/s

## 2016-09-16 LAB — HEMOGLOBIN A1C
Hgb A1c MFr Bld: 5 % (ref 4.8–5.6)
Mean Plasma Glucose: 97 mg/dL

## 2016-09-16 MED ORDER — DEXMEDETOMIDINE HCL IN NACL 400 MCG/100ML IV SOLN
0.1000 ug/kg/h | INTRAVENOUS | Status: AC
  Administered 2016-09-17: .3 ug/kg/h via INTRAVENOUS
  Filled 2016-09-16: qty 100

## 2016-09-16 MED ORDER — VANCOMYCIN HCL 10 G IV SOLR
1500.0000 mg | INTRAVENOUS | Status: AC
  Administered 2016-09-17: 1500 mg via INTRAVENOUS
  Filled 2016-09-16: qty 1500

## 2016-09-16 MED ORDER — VANCOMYCIN HCL 1000 MG IV SOLR
INTRAVENOUS | Status: AC
  Administered 2016-09-17: 11:00:00
  Filled 2016-09-16: qty 1000

## 2016-09-16 MED ORDER — TRANEXAMIC ACID (OHS) BOLUS VIA INFUSION
15.0000 mg/kg | INTRAVENOUS | Status: AC
  Administered 2016-09-17: 1374 mg via INTRAVENOUS
  Filled 2016-09-16: qty 1374

## 2016-09-16 MED ORDER — DOPAMINE-DEXTROSE 3.2-5 MG/ML-% IV SOLN
0.0000 ug/kg/min | INTRAVENOUS | Status: DC
  Filled 2016-09-16: qty 250

## 2016-09-16 MED ORDER — LIDOCAINE HCL (CARDIAC) 20 MG/ML IV SOLN
260.0000 mg | INTRAVENOUS | Status: DC
  Filled 2016-09-16 (×4): qty 13

## 2016-09-16 MED ORDER — EPINEPHRINE PF 1 MG/ML IJ SOLN
0.0000 ug/min | INTRAMUSCULAR | Status: DC
  Filled 2016-09-16: qty 4

## 2016-09-16 MED ORDER — LEVOFLOXACIN IN D5W 500 MG/100ML IV SOLN
500.0000 mg | INTRAVENOUS | Status: AC
  Administered 2016-09-17: 500 mg via INTRAVENOUS
  Filled 2016-09-16: qty 100

## 2016-09-16 MED ORDER — SODIUM CHLORIDE 0.9 % IV SOLN
INTRAVENOUS | Status: DC
  Filled 2016-09-16: qty 30

## 2016-09-16 MED ORDER — NITROGLYCERIN IN D5W 200-5 MCG/ML-% IV SOLN
2.0000 ug/min | INTRAVENOUS | Status: DC
  Filled 2016-09-16: qty 250

## 2016-09-16 MED ORDER — TRANEXAMIC ACID 1000 MG/10ML IV SOLN
1.5000 mg/kg/h | INTRAVENOUS | Status: AC
  Administered 2016-09-17: 1.5 mg/kg/h via INTRAVENOUS
  Filled 2016-09-16: qty 25

## 2016-09-16 MED ORDER — SODIUM CHLORIDE 0.9 % IV SOLN
30.0000 ug/min | INTRAVENOUS | Status: DC
  Filled 2016-09-16: qty 2

## 2016-09-16 MED ORDER — MANNITOL 20% IV SOLUTION 10G/50ML
6.4000 g | INTRAVENOUS | Status: DC
  Filled 2016-09-16 (×4): qty 60

## 2016-09-16 MED ORDER — POTASSIUM CHLORIDE 2 MEQ/ML IV SOLN
80.0000 meq | INTRAVENOUS | Status: DC
  Filled 2016-09-16: qty 40

## 2016-09-16 MED ORDER — TRANEXAMIC ACID (OHS) PUMP PRIME SOLUTION
2.0000 mg/kg | INTRAVENOUS | Status: DC
  Filled 2016-09-16: qty 1.83

## 2016-09-16 MED ORDER — MAGNESIUM SULFATE 50 % IJ SOLN
40.0000 meq | INTRAMUSCULAR | Status: DC
  Filled 2016-09-16: qty 10

## 2016-09-16 MED ORDER — PLASMA-LYTE 148 IV SOLN
INTRAVENOUS | Status: AC
  Administered 2016-09-17: 10:00:00
  Filled 2016-09-16: qty 2.5

## 2016-09-16 MED ORDER — SODIUM CHLORIDE 0.9 % IV SOLN
INTRAVENOUS | Status: AC
  Administered 2016-09-17: 1.3 [IU]/h via INTRAVENOUS
  Filled 2016-09-16: qty 2.5

## 2016-09-16 MED ORDER — LIDOCAINE HCL (CARDIAC) 20 MG/ML IV SOLN: 260.0000 mg | INTRAVENOUS | Status: DC

## 2016-09-16 NOTE — Anesthesia Preprocedure Evaluation (Addendum)
Anesthesia Evaluation  Patient identified by MRN, date of birth, ID band Patient awake    Reviewed: Allergy & Precautions, NPO status , Patient's Chart, lab work & pertinent test results  History of Anesthesia Complications (+) PONV  Airway Mallampati: II  TM Distance: >3 FB Neck ROM: Full    Dental  (+) Dental Advisory Given   Pulmonary neg pulmonary ROS,    breath sounds clear to auscultation       Cardiovascular hypertension, Pt. on medications and Pt. on home beta blockers + Valvular Problems/Murmurs AS  Rhythm:Regular Rate:Normal + Systolic murmurs    Neuro/Psych negative neurological ROS     GI/Hepatic negative GI ROS, Neg liver ROS,   Endo/Other    Renal/GU negative Renal ROS     Musculoskeletal  (+) Arthritis ,   Abdominal   Peds  Hematology  (+) Blood dyscrasia, ,   Anesthesia Other Findings   Reproductive/Obstetrics                            Lab Results  Component Value Date   WBC 5.1 09/15/2016   HGB 12.0 09/15/2016   HCT 33.9 (L) 09/15/2016   MCV 94.4 09/15/2016   PLT 145 (L) 09/15/2016   Lab Results  Component Value Date   CREATININE 0.82 09/15/2016   BUN 17 09/15/2016   NA 141 09/15/2016   K 3.6 09/15/2016   CL 107 09/15/2016   CO2 24 09/15/2016    Anesthesia Physical Anesthesia Plan  ASA: IV  Anesthesia Plan: General   Post-op Pain Management:    Induction: Intravenous  Airway Management Planned: Oral ETT  Additional Equipment: Arterial line, CVP, PA Cath, TEE and Ultrasound Guidance Line Placement  Intra-op Plan:   Post-operative Plan: Post-operative intubation/ventilation  Informed Consent: I have reviewed the patients History and Physical, chart, labs and discussed the procedure including the risks, benefits and alternatives for the proposed anesthesia with the patient or authorized representative who has indicated his/her understanding and  acceptance.     Plan Discussed with:   Anesthesia Plan Comments:        Anesthesia Quick Evaluation

## 2016-09-17 ENCOUNTER — Inpatient Hospital Stay (HOSPITAL_COMMUNITY): Payer: Medicare Other | Admitting: Anesthesiology

## 2016-09-17 ENCOUNTER — Encounter (HOSPITAL_COMMUNITY)
Admission: RE | Disposition: A | Discharge status: Skilled Nursing Facility | Payer: Self-pay | Source: Ambulatory Visit | Attending: Thoracic Surgery (Cardiothoracic Vascular Surgery)

## 2016-09-17 ENCOUNTER — Inpatient Hospital Stay (HOSPITAL_COMMUNITY): Payer: Medicare Other

## 2016-09-17 ENCOUNTER — Inpatient Hospital Stay (HOSPITAL_COMMUNITY)
Admission: RE | Admit: 2016-09-17 | Discharge: 2016-09-22 | DRG: 220 | Disposition: A | Discharge status: Skilled Nursing Facility | Payer: Medicare Other | Source: Ambulatory Visit | Attending: Thoracic Surgery (Cardiothoracic Vascular Surgery) | Admitting: Thoracic Surgery (Cardiothoracic Vascular Surgery)

## 2016-09-17 ENCOUNTER — Encounter (HOSPITAL_COMMUNITY): Payer: Self-pay | Admitting: Thoracic Surgery (Cardiothoracic Vascular Surgery)

## 2016-09-17 DIAGNOSIS — E669 Obesity, unspecified: Secondary | ICD-10-CM | POA: Diagnosis present

## 2016-09-17 DIAGNOSIS — R911 Solitary pulmonary nodule: Secondary | ICD-10-CM | POA: Diagnosis present

## 2016-09-17 DIAGNOSIS — Z6834 Body mass index (BMI) 34.0-34.9, adult: Secondary | ICD-10-CM | POA: Diagnosis not present

## 2016-09-17 DIAGNOSIS — Z953 Presence of xenogenic heart valve: Secondary | ICD-10-CM

## 2016-09-17 DIAGNOSIS — I110 Hypertensive heart disease with heart failure: Secondary | ICD-10-CM | POA: Diagnosis not present

## 2016-09-17 DIAGNOSIS — I700 Atherosclerosis of aorta: Secondary | ICD-10-CM | POA: Diagnosis present

## 2016-09-17 DIAGNOSIS — Q231 Congenital insufficiency of aortic valve: Secondary | ICD-10-CM | POA: Diagnosis not present

## 2016-09-17 DIAGNOSIS — D649 Anemia, unspecified: Secondary | ICD-10-CM | POA: Diagnosis present

## 2016-09-17 DIAGNOSIS — I350 Nonrheumatic aortic (valve) stenosis: Secondary | ICD-10-CM

## 2016-09-17 DIAGNOSIS — I5032 Chronic diastolic (congestive) heart failure: Secondary | ICD-10-CM | POA: Diagnosis not present

## 2016-09-17 DIAGNOSIS — R0602 Shortness of breath: Secondary | ICD-10-CM | POA: Diagnosis present

## 2016-09-17 DIAGNOSIS — M47814 Spondylosis without myelopathy or radiculopathy, thoracic region: Secondary | ICD-10-CM | POA: Diagnosis present

## 2016-09-17 DIAGNOSIS — T8131XA Disruption of external operation (surgical) wound, not elsewhere classified, initial encounter: Secondary | ICD-10-CM | POA: Diagnosis not present

## 2016-09-17 DIAGNOSIS — Z79899 Other long term (current) drug therapy: Secondary | ICD-10-CM | POA: Diagnosis not present

## 2016-09-17 DIAGNOSIS — D62 Acute posthemorrhagic anemia: Secondary | ICD-10-CM | POA: Diagnosis not present

## 2016-09-17 DIAGNOSIS — Z951 Presence of aortocoronary bypass graft: Secondary | ICD-10-CM | POA: Diagnosis not present

## 2016-09-17 DIAGNOSIS — J9811 Atelectasis: Secondary | ICD-10-CM

## 2016-09-17 DIAGNOSIS — R112 Nausea with vomiting, unspecified: Secondary | ICD-10-CM | POA: Diagnosis not present

## 2016-09-17 DIAGNOSIS — E785 Hyperlipidemia, unspecified: Secondary | ICD-10-CM | POA: Diagnosis present

## 2016-09-17 DIAGNOSIS — Z4682 Encounter for fitting and adjustment of non-vascular catheter: Secondary | ICD-10-CM

## 2016-09-17 DIAGNOSIS — D6959 Other secondary thrombocytopenia: Secondary | ICD-10-CM | POA: Diagnosis not present

## 2016-09-17 DIAGNOSIS — I10 Essential (primary) hypertension: Secondary | ICD-10-CM | POA: Diagnosis present

## 2016-09-17 DIAGNOSIS — J90 Pleural effusion, not elsewhere classified: Secondary | ICD-10-CM | POA: Diagnosis not present

## 2016-09-17 DIAGNOSIS — Z880 Allergy status to penicillin: Secondary | ICD-10-CM | POA: Diagnosis not present

## 2016-09-17 DIAGNOSIS — Z952 Presence of prosthetic heart valve: Secondary | ICD-10-CM

## 2016-09-17 DIAGNOSIS — Y838 Other surgical procedures as the cause of abnormal reaction of the patient, or of later complication, without mention of misadventure at the time of the procedure: Secondary | ICD-10-CM | POA: Diagnosis not present

## 2016-09-17 HISTORY — PX: AORTIC VALVE REPLACEMENT: SHX41

## 2016-09-17 HISTORY — DX: Presence of xenogenic heart valve: Z95.3

## 2016-09-17 HISTORY — PX: TEE WITHOUT CARDIOVERSION: SHX5443

## 2016-09-17 HISTORY — DX: Chronic diastolic (congestive) heart failure: I50.32

## 2016-09-17 LAB — POCT I-STAT, CHEM 8
BUN: 13 mg/dL (ref 6–20)
BUN: 13 mg/dL (ref 6–20)
BUN: 12 mg/dL (ref 6–20)
BUN: 13 mg/dL (ref 6–20)
BUN: 12 mg/dL (ref 6–20)
BUN: 12 mg/dL (ref 6–20)
CALCIUM ION: 1.18 mmol/L (ref 1.15–1.40)
CALCIUM ION: 1.07 mmol/L — AB (ref 1.15–1.40)
CALCIUM ION: 1.31 mmol/L (ref 1.15–1.40)
CHLORIDE: 103 mmol/L (ref 101–111)
CHLORIDE: 107 mmol/L (ref 101–111)
CREATININE: 0.4 mg/dL — AB (ref 0.44–1.00)
CREATININE: 0.6 mg/dL (ref 0.44–1.00)
CREATININE: 0.6 mg/dL (ref 0.44–1.00)
Calcium, Ion: 1.23 mmol/L (ref 1.15–1.40)
Calcium, Ion: 0.93 mmol/L — ABNORMAL LOW (ref 1.15–1.40)
Calcium, Ion: 1.18 mmol/L (ref 1.15–1.40)
Chloride: 103 mmol/L (ref 101–111)
Chloride: 103 mmol/L (ref 101–111)
Chloride: 98 mmol/L — ABNORMAL LOW (ref 101–111)
Chloride: 100 mmol/L — ABNORMAL LOW (ref 101–111)
Creatinine, Ser: 0.5 mg/dL (ref 0.44–1.00)
Creatinine, Ser: 0.5 mg/dL (ref 0.44–1.00)
Creatinine, Ser: 0.5 mg/dL (ref 0.44–1.00)
GLUCOSE: 127 mg/dL — AB (ref 65–99)
GLUCOSE: 162 mg/dL — AB (ref 65–99)
GLUCOSE: 154 mg/dL — AB (ref 65–99)
GLUCOSE: 149 mg/dL — AB (ref 65–99)
GLUCOSE: 165 mg/dL — AB (ref 65–99)
Glucose, Bld: 140 mg/dL — ABNORMAL HIGH (ref 65–99)
HCT: 25 % — ABNORMAL LOW (ref 36.0–46.0)
HCT: 23 % — ABNORMAL LOW (ref 36.0–46.0)
HCT: 24 % — ABNORMAL LOW (ref 36.0–46.0)
HCT: 24 % — ABNORMAL LOW (ref 36.0–46.0)
HCT: 26 % — ABNORMAL LOW (ref 36.0–46.0)
HCT: 26 % — ABNORMAL LOW (ref 36.0–46.0)
HEMOGLOBIN: 8.5 g/dL — AB (ref 12.0–15.0)
HEMOGLOBIN: 8.2 g/dL — AB (ref 12.0–15.0)
HEMOGLOBIN: 8.2 g/dL — AB (ref 12.0–15.0)
Hemoglobin: 7.8 g/dL — ABNORMAL LOW (ref 12.0–15.0)
Hemoglobin: 8.8 g/dL — ABNORMAL LOW (ref 12.0–15.0)
Hemoglobin: 8.8 g/dL — ABNORMAL LOW (ref 12.0–15.0)
POTASSIUM: 3.3 mmol/L — AB (ref 3.5–5.1)
POTASSIUM: 3.8 mmol/L (ref 3.5–5.1)
Potassium: 3.5 mmol/L (ref 3.5–5.1)
Potassium: 3.8 mmol/L (ref 3.5–5.1)
Potassium: 4.1 mmol/L (ref 3.5–5.1)
Potassium: 3.8 mmol/L (ref 3.5–5.1)
SODIUM: 139 mmol/L (ref 135–145)
SODIUM: 138 mmol/L (ref 135–145)
SODIUM: 138 mmol/L (ref 135–145)
Sodium: 139 mmol/L (ref 135–145)
Sodium: 137 mmol/L (ref 135–145)
Sodium: 142 mmol/L (ref 135–145)
TCO2: 29 mmol/L (ref 0–100)
TCO2: 30 mmol/L (ref 0–100)
TCO2: 31 mmol/L (ref 0–100)
TCO2: 30 mmol/L (ref 0–100)
TCO2: 28 mmol/L (ref 0–100)
TCO2: 21 mmol/L (ref 0–100)

## 2016-09-17 LAB — POCT I-STAT 3, ART BLOOD GAS (G3+)
ACID-BASE DEFICIT: 1 mmol/L (ref 0.0–2.0)
ACID-BASE EXCESS: 5 mmol/L — AB (ref 0.0–2.0)
Acid-base deficit: 5 mmol/L — ABNORMAL HIGH (ref 0.0–2.0)
Acid-base deficit: 6 mmol/L — ABNORMAL HIGH (ref 0.0–2.0)
BICARBONATE: 30 mmol/L — AB (ref 20.0–28.0)
BICARBONATE: 19.8 mmol/L — AB (ref 20.0–28.0)
Bicarbonate: 22.9 mmol/L (ref 20.0–28.0)
Bicarbonate: 19.4 mmol/L — ABNORMAL LOW (ref 20.0–28.0)
O2 SAT: 100 %
O2 Saturation: 97 %
O2 Saturation: 99 %
O2 Saturation: 96 %
PCO2 ART: 35.8 mmHg (ref 32.0–48.0)
PCO2 ART: 39 mmHg (ref 32.0–48.0)
PH ART: 7.351 (ref 7.350–7.450)
PO2 ART: 442 mmHg — AB (ref 83.0–108.0)
PO2 ART: 83 mmHg (ref 83.0–108.0)
PO2 ART: 92 mmHg (ref 83.0–108.0)
TCO2: 31 mmol/L (ref 0–100)
TCO2: 24 mmol/L (ref 0–100)
TCO2: 21 mmol/L (ref 0–100)
TCO2: 21 mmol/L (ref 0–100)
pCO2 arterial: 44.1 mmHg (ref 32.0–48.0)
pCO2 arterial: 32 mmHg (ref 32.0–48.0)
pH, Arterial: 7.441 (ref 7.350–7.450)
pH, Arterial: 7.461 — ABNORMAL HIGH (ref 7.350–7.450)
pH, Arterial: 7.304 — ABNORMAL LOW (ref 7.350–7.450)
pO2, Arterial: 121 mmHg — ABNORMAL HIGH (ref 83.0–108.0)

## 2016-09-17 LAB — GLUCOSE, CAPILLARY
GLUCOSE-CAPILLARY: 103 mg/dL — AB (ref 65–99)
GLUCOSE-CAPILLARY: 148 mg/dL — AB (ref 65–99)
Glucose-Capillary: 103 mg/dL — ABNORMAL HIGH (ref 65–99)
Glucose-Capillary: 114 mg/dL — ABNORMAL HIGH (ref 65–99)
Glucose-Capillary: 96 mg/dL (ref 65–99)
Glucose-Capillary: 87 mg/dL (ref 65–99)
Glucose-Capillary: 103 mg/dL — ABNORMAL HIGH (ref 65–99)

## 2016-09-17 LAB — APTT: aPTT: 33 seconds (ref 24–36)

## 2016-09-17 LAB — CBC
HCT: 31.8 % — ABNORMAL LOW (ref 36.0–46.0)
HEMOGLOBIN: 11.1 g/dL — AB (ref 12.0–15.0)
MCH: 32 pg (ref 26.0–34.0)
MCHC: 34.9 g/dL (ref 30.0–36.0)
MCV: 91.6 fL (ref 78.0–100.0)
PLATELETS: 71 10*3/uL — AB (ref 150–400)
RBC: 3.47 MIL/uL — ABNORMAL LOW (ref 3.87–5.11)
RDW: 14.2 % (ref 11.5–15.5)
WBC: 6.3 10*3/uL (ref 4.0–10.5)

## 2016-09-17 LAB — PREPARE RBC (CROSSMATCH)

## 2016-09-17 LAB — HEMOGLOBIN AND HEMATOCRIT, BLOOD
HCT: 26.5 % — ABNORMAL LOW (ref 36.0–46.0)
Hemoglobin: 9.3 g/dL — ABNORMAL LOW (ref 12.0–15.0)

## 2016-09-17 LAB — CREATININE, SERUM: Creatinine, Ser: 0.83 mg/dL (ref 0.44–1.00)

## 2016-09-17 LAB — PLATELET COUNT: PLATELETS: 91 10*3/uL — AB (ref 150–400)

## 2016-09-17 LAB — PROTIME-INR
INR: 1.45
PROTHROMBIN TIME: 17.7 s — AB (ref 11.4–15.2)

## 2016-09-17 LAB — MAGNESIUM: Magnesium: 2.7 mg/dL — ABNORMAL HIGH (ref 1.7–2.4)

## 2016-09-17 SURGERY — REPLACEMENT, AORTIC VALVE, MINIMALLY INVASIVE
Anesthesia: General | Site: Chest

## 2016-09-17 MED ORDER — SODIUM CHLORIDE 0.45 % IV SOLN: INTRAVENOUS | Status: DC | PRN

## 2016-09-17 MED ORDER — FAMOTIDINE IN NACL 20-0.9 MG/50ML-% IV SOLN
20.0000 mg | Freq: Two times a day (BID) | INTRAVENOUS | Status: DC
  Administered 2016-09-17: 20 mg via INTRAVENOUS

## 2016-09-17 MED ORDER — CHLORHEXIDINE GLUCONATE 4 % EX LIQD: 30.0000 mL | CUTANEOUS | Status: DC

## 2016-09-17 MED ORDER — OXYCODONE HCL 5 MG PO TABS
5.0000 mg | ORAL_TABLET | ORAL | Status: DC | PRN
  Administered 2016-09-18: 5 mg via ORAL
  Administered 2016-09-18: 10 mg via ORAL
  Administered 2016-09-18 (×2): 5 mg via ORAL
  Administered 2016-09-19: 10 mg via ORAL
  Filled 2016-09-17 (×2): qty 2
  Filled 2016-09-17 (×3): qty 1

## 2016-09-17 MED ORDER — PHENYLEPHRINE HCL 10 MG/ML IJ SOLN
INTRAMUSCULAR | Status: DC | PRN
  Administered 2016-09-17: 80 ug via INTRAVENOUS

## 2016-09-17 MED ORDER — ONDANSETRON HCL 4 MG/2ML IJ SOLN
4.0000 mg | Freq: Four times a day (QID) | INTRAMUSCULAR | Status: DC | PRN
  Administered 2016-09-17 – 2016-09-19 (×4): 4 mg via INTRAVENOUS
  Filled 2016-09-17 (×5): qty 2

## 2016-09-17 MED ORDER — ALBUMIN HUMAN 5 % IV SOLN
250.0000 mL | INTRAVENOUS | Status: AC | PRN
  Administered 2016-09-17 (×3): 250 mL via INTRAVENOUS
  Filled 2016-09-17: qty 250

## 2016-09-17 MED ORDER — PROPOFOL 10 MG/ML IV BOLUS
INTRAVENOUS | Status: AC
  Filled 2016-09-17: qty 20

## 2016-09-17 MED ORDER — NITROGLYCERIN IN D5W 200-5 MCG/ML-% IV SOLN: 0.0000 ug/min | INTRAVENOUS | Status: DC

## 2016-09-17 MED ORDER — LACTATED RINGERS IV SOLN
INTRAVENOUS | Status: DC | PRN
  Administered 2016-09-17: 08:00:00 via INTRAVENOUS

## 2016-09-17 MED ORDER — MAGNESIUM SULFATE 4 GM/100ML IV SOLN
4.0000 g | Freq: Once | INTRAVENOUS | Status: AC
  Administered 2016-09-17: 4 g via INTRAVENOUS
  Filled 2016-09-17: qty 100

## 2016-09-17 MED ORDER — PHENYLEPHRINE 40 MCG/ML (10ML) SYRINGE FOR IV PUSH (FOR BLOOD PRESSURE SUPPORT)
PREFILLED_SYRINGE | INTRAVENOUS | Status: AC
  Filled 2016-09-17: qty 10

## 2016-09-17 MED ORDER — INSULIN ASPART 100 UNIT/ML ~~LOC~~ SOLN
0.0000 [IU] | SUBCUTANEOUS | Status: DC
  Administered 2016-09-17 – 2016-09-18 (×5): 2 [IU] via SUBCUTANEOUS
  Administered 2016-09-18: 4 [IU] via SUBCUTANEOUS

## 2016-09-17 MED ORDER — FENTANYL CITRATE (PF) 250 MCG/5ML IJ SOLN
INTRAMUSCULAR | Status: AC
  Filled 2016-09-17: qty 5

## 2016-09-17 MED ORDER — METOPROLOL TARTRATE 12.5 MG HALF TABLET: 12.5000 mg | ORAL_TABLET | Freq: Two times a day (BID) | ORAL | Status: DC

## 2016-09-17 MED ORDER — ROCURONIUM BROMIDE 100 MG/10ML IV SOLN
INTRAVENOUS | Status: DC | PRN
  Administered 2016-09-17: 50 mg via INTRAVENOUS
  Administered 2016-09-17: 20 mg via INTRAVENOUS
  Administered 2016-09-17 (×2): 50 mg via INTRAVENOUS

## 2016-09-17 MED ORDER — ALBUMIN HUMAN 5 % IV SOLN
INTRAVENOUS | Status: DC | PRN
  Administered 2016-09-17: 13:00:00 via INTRAVENOUS

## 2016-09-17 MED ORDER — GELATIN ABSORBABLE MT POWD
OROMUCOSAL | Status: DC | PRN
  Administered 2016-09-17 (×2): 4 mL via TOPICAL

## 2016-09-17 MED ORDER — FENTANYL CITRATE (PF) 250 MCG/5ML IJ SOLN
INTRAMUSCULAR | Status: AC
  Filled 2016-09-17: qty 15

## 2016-09-17 MED ORDER — HEPARIN SODIUM (PORCINE) 1000 UNIT/ML IJ SOLN
INTRAMUSCULAR | Status: DC | PRN
  Administered 2016-09-17: 29000 [IU] via INTRAVENOUS

## 2016-09-17 MED ORDER — TRAMADOL HCL 50 MG PO TABS
50.0000 mg | ORAL_TABLET | ORAL | Status: DC | PRN
  Administered 2016-09-19: 100 mg via ORAL
  Filled 2016-09-17: qty 2

## 2016-09-17 MED ORDER — ACETAMINOPHEN 160 MG/5ML PO SOLN: 1000.0000 mg | Freq: Four times a day (QID) | ORAL | Status: DC

## 2016-09-17 MED ORDER — ACETAMINOPHEN 500 MG PO TABS
1000.0000 mg | ORAL_TABLET | Freq: Four times a day (QID) | ORAL | Status: DC
  Administered 2016-09-17 – 2016-09-22 (×15): 1000 mg via ORAL
  Filled 2016-09-17 (×17): qty 2

## 2016-09-17 MED ORDER — MIDAZOLAM HCL 5 MG/5ML IJ SOLN
INTRAMUSCULAR | Status: DC | PRN
  Administered 2016-09-17 (×3): 2 mg via INTRAVENOUS

## 2016-09-17 MED ORDER — HEPARIN SODIUM (PORCINE) 1000 UNIT/ML IJ SOLN
INTRAMUSCULAR | Status: AC
  Filled 2016-09-17: qty 1

## 2016-09-17 MED ORDER — SODIUM CHLORIDE 0.9% FLUSH
3.0000 mL | Freq: Two times a day (BID) | INTRAVENOUS | Status: DC
  Administered 2016-09-18 – 2016-09-19 (×3): 3 mL via INTRAVENOUS

## 2016-09-17 MED ORDER — SODIUM CHLORIDE 0.9 % IV SOLN
30.0000 meq | Freq: Once | INTRAVENOUS | Status: AC
  Administered 2016-09-17: 30 meq via INTRAVENOUS
  Filled 2016-09-17: qty 15

## 2016-09-17 MED ORDER — ROCURONIUM BROMIDE 50 MG/5ML IV SOSY
PREFILLED_SYRINGE | INTRAVENOUS | Status: AC
  Filled 2016-09-17: qty 10

## 2016-09-17 MED ORDER — LABETALOL HCL 5 MG/ML IV SOLN
10.0000 mg | INTRAVENOUS | Status: DC | PRN
  Administered 2016-09-17: 10 mg via INTRAVENOUS

## 2016-09-17 MED ORDER — ARTIFICIAL TEARS OP OINT
TOPICAL_OINTMENT | OPHTHALMIC | Status: DC | PRN
  Administered 2016-09-17: 1 via OPHTHALMIC

## 2016-09-17 MED ORDER — SUCCINYLCHOLINE CHLORIDE 200 MG/10ML IV SOSY
PREFILLED_SYRINGE | INTRAVENOUS | Status: AC
  Filled 2016-09-17: qty 10

## 2016-09-17 MED ORDER — LIDOCAINE 2% (20 MG/ML) 5 ML SYRINGE
INTRAMUSCULAR | Status: AC
  Filled 2016-09-17: qty 5

## 2016-09-17 MED ORDER — BISACODYL 10 MG RE SUPP: 10.0000 mg | Freq: Every day | RECTAL | Status: DC

## 2016-09-17 MED ORDER — ASPIRIN 81 MG PO CHEW: 324.0000 mg | CHEWABLE_TABLET | Freq: Every day | ORAL | Status: DC

## 2016-09-17 MED ORDER — HEMOSTATIC AGENTS (NO CHARGE) OPTIME: TOPICAL | Status: DC | PRN

## 2016-09-17 MED ORDER — MIDAZOLAM HCL 2 MG/2ML IJ SOLN: 2.0000 mg | INTRAMUSCULAR | Status: DC | PRN

## 2016-09-17 MED ORDER — LACTATED RINGERS IV SOLN
INTRAVENOUS | Status: DC | PRN
  Administered 2016-09-17 (×2): via INTRAVENOUS

## 2016-09-17 MED ORDER — VANCOMYCIN HCL IN DEXTROSE 1-5 GM/200ML-% IV SOLN
1000.0000 mg | Freq: Once | INTRAVENOUS | Status: AC
  Administered 2016-09-17: 1000 mg via INTRAVENOUS
  Filled 2016-09-17: qty 200

## 2016-09-17 MED ORDER — SODIUM CHLORIDE 0.9 % IV SOLN
INTRAVENOUS | Status: DC
  Filled 2016-09-17: qty 2.5

## 2016-09-17 MED ORDER — DEXTROSE 5 % IV SOLN
INTRAVENOUS | Status: DC | PRN
  Administered 2016-09-17: 30 ug/min via INTRAVENOUS

## 2016-09-17 MED ORDER — DOCUSATE SODIUM 100 MG PO CAPS
200.0000 mg | ORAL_CAPSULE | Freq: Every day | ORAL | Status: DC
  Administered 2016-09-18 – 2016-09-20 (×3): 200 mg via ORAL
  Filled 2016-09-17 (×3): qty 2

## 2016-09-17 MED ORDER — METOPROLOL TARTRATE 12.5 MG HALF TABLET: 12.5000 mg | ORAL_TABLET | Freq: Once | ORAL | Status: DC

## 2016-09-17 MED ORDER — METOPROLOL TARTRATE 5 MG/5ML IV SOLN: 2.5000 mg | INTRAVENOUS | Status: DC | PRN

## 2016-09-17 MED ORDER — LACTATED RINGERS IV SOLN: INTRAVENOUS | Status: DC

## 2016-09-17 MED ORDER — CHLORHEXIDINE GLUCONATE 0.12 % MT SOLN
OROMUCOSAL | Status: AC
  Filled 2016-09-17: qty 15

## 2016-09-17 MED ORDER — MIDAZOLAM HCL 10 MG/2ML IJ SOLN
INTRAMUSCULAR | Status: AC
  Filled 2016-09-17: qty 2

## 2016-09-17 MED ORDER — PROTAMINE SULFATE 10 MG/ML IV SOLN
INTRAVENOUS | Status: AC
  Filled 2016-09-17: qty 25

## 2016-09-17 MED ORDER — CHLORHEXIDINE GLUCONATE 0.12 % MT SOLN: 15.0000 mL | Freq: Once | OROMUCOSAL | Status: DC

## 2016-09-17 MED ORDER — ROCURONIUM BROMIDE 50 MG/5ML IV SOSY
PREFILLED_SYRINGE | INTRAVENOUS | Status: AC
  Filled 2016-09-17: qty 5

## 2016-09-17 MED ORDER — PROTAMINE SULFATE 10 MG/ML IV SOLN
INTRAVENOUS | Status: DC | PRN
  Administered 2016-09-17: 270 mg via INTRAVENOUS

## 2016-09-17 MED ORDER — SODIUM CHLORIDE 0.9 % IV SOLN
INTRAVENOUS | Status: DC
  Administered 2016-09-17 (×2): via INTRAVENOUS

## 2016-09-17 MED ORDER — METOPROLOL TARTRATE 25 MG/10 ML ORAL SUSPENSION
12.5000 mg | Freq: Two times a day (BID) | ORAL | Status: DC
  Administered 2016-09-17: 12.5 mg
  Filled 2016-09-17: qty 5

## 2016-09-17 MED ORDER — ARTIFICIAL TEARS OP OINT
TOPICAL_OINTMENT | OPHTHALMIC | Status: AC
  Filled 2016-09-17: qty 3.5

## 2016-09-17 MED ORDER — LABETALOL HCL 5 MG/ML IV SOLN
INTRAVENOUS | Status: AC
  Administered 2016-09-17: 10 mg via INTRAVENOUS
  Filled 2016-09-17: qty 4

## 2016-09-17 MED ORDER — BISACODYL 5 MG PO TBEC
10.0000 mg | DELAYED_RELEASE_TABLET | Freq: Every day | ORAL | Status: DC
  Administered 2016-09-18 – 2016-09-20 (×3): 10 mg via ORAL
  Filled 2016-09-17 (×3): qty 2

## 2016-09-17 MED ORDER — LIDOCAINE HCL (CARDIAC) 20 MG/ML IV SOLN
INTRAVENOUS | Status: DC | PRN
  Administered 2016-09-17: 100 mg via INTRAVENOUS

## 2016-09-17 MED ORDER — EPHEDRINE 5 MG/ML INJ
INTRAVENOUS | Status: AC
  Filled 2016-09-17: qty 10

## 2016-09-17 MED ORDER — ACETAMINOPHEN 650 MG RE SUPP
650.0000 mg | Freq: Once | RECTAL | Status: AC
  Administered 2016-09-17: 650 mg via RECTAL

## 2016-09-17 MED ORDER — PANTOPRAZOLE SODIUM 40 MG PO TBEC
40.0000 mg | DELAYED_RELEASE_TABLET | Freq: Every day | ORAL | Status: DC
  Administered 2016-09-19 – 2016-09-22 (×4): 40 mg via ORAL
  Filled 2016-09-17 (×4): qty 1

## 2016-09-17 MED ORDER — LACTATED RINGERS IV SOLN: 500.0000 mL | Freq: Once | INTRAVENOUS | Status: DC | PRN

## 2016-09-17 MED ORDER — LEVOFLOXACIN IN D5W 750 MG/150ML IV SOLN
750.0000 mg | INTRAVENOUS | Status: AC
  Administered 2016-09-18: 750 mg via INTRAVENOUS
  Filled 2016-09-17: qty 150

## 2016-09-17 MED ORDER — LACTATED RINGERS IV SOLN
INTRAVENOUS | Status: DC
  Administered 2016-09-18: 02:00:00 via INTRAVENOUS

## 2016-09-17 MED ORDER — PROPOFOL 10 MG/ML IV BOLUS
INTRAVENOUS | Status: DC | PRN
  Administered 2016-09-17: 20 mg via INTRAVENOUS
  Administered 2016-09-17: 30 mg via INTRAVENOUS

## 2016-09-17 MED ORDER — ACETAMINOPHEN 160 MG/5ML PO SOLN: 650.0000 mg | Freq: Once | ORAL | Status: AC

## 2016-09-17 MED ORDER — ESMOLOL HCL 100 MG/10ML IV SOLN
INTRAVENOUS | Status: DC | PRN
  Administered 2016-09-17 (×3): 10 mg via INTRAVENOUS

## 2016-09-17 MED ORDER — SODIUM CHLORIDE 0.9 % IV SOLN: 250.0000 mL | INTRAVENOUS | Status: DC

## 2016-09-17 MED ORDER — DEXMEDETOMIDINE HCL 200 MCG/2ML IV SOLN
0.0000 ug/kg/h | INTRAVENOUS | Status: DC
  Administered 2016-09-17: 0.5 ug/kg/h via INTRAVENOUS
  Filled 2016-09-17 (×2): qty 2

## 2016-09-17 MED ORDER — 0.9 % SODIUM CHLORIDE (POUR BTL) OPTIME
TOPICAL | Status: DC | PRN
  Administered 2016-09-17: 3000 mL
  Administered 2016-09-17: 6000 mL

## 2016-09-17 MED ORDER — CHLORHEXIDINE GLUCONATE 0.12 % MT SOLN
15.0000 mL | OROMUCOSAL | Status: AC
  Administered 2016-09-17: 15 mL via OROMUCOSAL
  Filled 2016-09-17: qty 15

## 2016-09-17 MED ORDER — SODIUM CHLORIDE 0.9 % IV SOLN
0.0000 ug/min | INTRAVENOUS | Status: DC
  Filled 2016-09-17: qty 2

## 2016-09-17 MED ORDER — MORPHINE SULFATE (PF) 4 MG/ML IV SOLN
1.0000 mg | INTRAVENOUS | Status: DC | PRN
  Administered 2016-09-18: 2 mg via INTRAVENOUS
  Filled 2016-09-17 (×2): qty 1

## 2016-09-17 MED ORDER — SODIUM CHLORIDE 0.9 % IV SOLN
INTRAVENOUS | Status: DC
  Administered 2016-09-17: 15:00:00 via INTRAVENOUS

## 2016-09-17 MED ORDER — INSULIN REGULAR BOLUS VIA INFUSION
0.0000 [IU] | Freq: Three times a day (TID) | INTRAVENOUS | Status: DC
  Filled 2016-09-17: qty 10

## 2016-09-17 MED ORDER — SODIUM CHLORIDE 0.9% FLUSH: 3.0000 mL | INTRAVENOUS | Status: DC | PRN

## 2016-09-17 MED ORDER — ASPIRIN EC 325 MG PO TBEC
325.0000 mg | DELAYED_RELEASE_TABLET | Freq: Every day | ORAL | Status: DC
  Administered 2016-09-18 – 2016-09-22 (×5): 325 mg via ORAL
  Filled 2016-09-17 (×5): qty 1

## 2016-09-17 MED ORDER — MORPHINE SULFATE (PF) 4 MG/ML IV SOLN
1.0000 mg | INTRAVENOUS | Status: DC | PRN
  Administered 2016-09-17: 4 mg via INTRAVENOUS
  Administered 2016-09-18: 2 mg via INTRAVENOUS
  Filled 2016-09-17: qty 1

## 2016-09-17 MED ORDER — FENTANYL CITRATE (PF) 250 MCG/5ML IJ SOLN
INTRAMUSCULAR | Status: DC | PRN
  Administered 2016-09-17: 150 ug via INTRAVENOUS
  Administered 2016-09-17 (×2): 50 ug via INTRAVENOUS
  Administered 2016-09-17: 150 ug via INTRAVENOUS
  Administered 2016-09-17 (×2): 100 ug via INTRAVENOUS
  Administered 2016-09-17 (×2): 50 ug via INTRAVENOUS
  Administered 2016-09-17 (×2): 150 ug via INTRAVENOUS
  Administered 2016-09-17: 250 ug via INTRAVENOUS
  Administered 2016-09-17: 50 ug via INTRAVENOUS
  Administered 2016-09-17 (×2): 100 ug via INTRAVENOUS

## 2016-09-17 MED FILL — Heparin Sodium (Porcine) Inj 1000 Unit/ML: INTRAMUSCULAR | Qty: 30 | Status: AC

## 2016-09-17 MED FILL — Potassium Chloride Inj 2 mEq/ML: INTRAVENOUS | Qty: 40 | Status: AC

## 2016-09-17 MED FILL — Magnesium Sulfate Inj 50%: INTRAMUSCULAR | Qty: 10 | Status: AC

## 2016-09-17 SURGICAL SUPPLY — 102 items
ADAPTER CARDIO PERF ANTE/RETRO (ADAPTER) ×3 IMPLANT
BAG DECANTER FOR FLEXI CONT (MISCELLANEOUS) ×6 IMPLANT
CANISTER SUCT 3000ML PPV (MISCELLANEOUS) ×3 IMPLANT
CANNULA FEM VENOUS REMOTE 22FR (CANNULA) IMPLANT
CANNULA GUNDRY RCSP 15FR (MISCELLANEOUS) ×3 IMPLANT
CANNULA OPTISITE PERFUSION 16F (CANNULA) IMPLANT
CANNULA OPTISITE PERFUSION 18F (CANNULA) IMPLANT
CANNULA SUMP PERICARDIAL (CANNULA) ×6 IMPLANT
CATH ENDOVENT PULMONARY (CATHETERS) ×3 IMPLANT
CATH HEART VENT LEFT (CATHETERS) ×2 IMPLANT
CATH THORACIC 36FR (CATHETERS) ×3 IMPLANT
CELLS DAT CNTRL 66122 CELL SVR (MISCELLANEOUS) ×2 IMPLANT
CONN ST 1/4X3/8  BEN (MISCELLANEOUS) ×4
CONN ST 1/4X3/8 BEN (MISCELLANEOUS) ×8 IMPLANT
CONNECTOR 1/2X3/8X1/2 3 WAY (MISCELLANEOUS) ×1
CONNECTOR 1/2X3/8X1/2 3WAY (MISCELLANEOUS) ×2 IMPLANT
CONT SPEC 4OZ CLIKSEAL STRL BL (MISCELLANEOUS) ×3 IMPLANT
COVER BACK TABLE 24X17X13 BIG (DRAPES) ×3 IMPLANT
COVER PROBE W GEL 5X96 (DRAPES) ×3 IMPLANT
CRADLE DONUT ADULT HEAD (MISCELLANEOUS) ×3 IMPLANT
DERMABOND ADVANCED (GAUZE/BANDAGES/DRESSINGS) ×1
DERMABOND ADVANCED .7 DNX12 (GAUZE/BANDAGES/DRESSINGS) ×2 IMPLANT
DEVICE PMI PUNCTURE CLOSURE (MISCELLANEOUS) ×3 IMPLANT
DEVICE TROCAR PUNCTURE CLOSURE (ENDOMECHANICALS) ×3 IMPLANT
DRAIN CHANNEL 28F RND 3/8 FF (WOUND CARE) ×6 IMPLANT
DRAIN CHANNEL 32F RND 10.7 FF (WOUND CARE) ×6 IMPLANT
DRAPE BILATERAL SPLIT (DRAPES) ×3 IMPLANT
DRAPE CV SPLIT W-CLR ANES SCRN (DRAPES) ×3 IMPLANT
DRAPE INCISE IOBAN 66X45 STRL (DRAPES) ×9 IMPLANT
DRAPE SLUSH/WARMER DISC (DRAPES) ×3 IMPLANT
DRAPE STERI IOBAN 125X83 (DRAPES) ×3 IMPLANT
DRSG AQUACEL AG ADV 3.5X 6 (GAUZE/BANDAGES/DRESSINGS) ×3 IMPLANT
DRSG AQUACEL AG ADV 3.5X14 (GAUZE/BANDAGES/DRESSINGS) ×3 IMPLANT
ELECT BLADE 4.0 EZ CLEAN MEGAD (MISCELLANEOUS) ×3
ELECT BLADE 6.5 EXT (BLADE) ×3 IMPLANT
ELECT REM PT RETURN 9FT ADLT (ELECTROSURGICAL) ×6
ELECTRODE BLDE 4.0 EZ CLN MEGD (MISCELLANEOUS) ×2 IMPLANT
ELECTRODE REM PT RTRN 9FT ADLT (ELECTROSURGICAL) ×4 IMPLANT
FELT TEFLON 1X6 (MISCELLANEOUS) ×6 IMPLANT
FEMORAL VENOUS CANN RAP (CANNULA) ×6 IMPLANT
GAUZE SPONGE 4X4 12PLY STRL (GAUZE/BANDAGES/DRESSINGS) ×3 IMPLANT
GAUZE SPONGE 4X4 12PLY STRL LF (GAUZE/BANDAGES/DRESSINGS) ×3 IMPLANT
GLOVE ORTHO TXT STRL SZ7.5 (GLOVE) ×9 IMPLANT
GOWN STRL REUS W/ TWL LRG LVL3 (GOWN DISPOSABLE) ×20 IMPLANT
GOWN STRL REUS W/TWL LRG LVL3 (GOWN DISPOSABLE) ×10
GRASPER SUT TROCAR 14GX15 (MISCELLANEOUS) ×3 IMPLANT
GUIDEWIRE ANGLED .035X150CM (WIRE) ×3 IMPLANT
HEMOSTAT POWDER SURGIFOAM 1G (HEMOSTASIS) ×12 IMPLANT
IV CATH 22GX1 FEP (IV SOLUTION) ×3 IMPLANT
IV SOD CHL 0.9% 1000ML (IV SOLUTION) ×3 IMPLANT
KIT BASIN OR (CUSTOM PROCEDURE TRAY) ×3 IMPLANT
KIT CATH SUCT 8FR (CATHETERS) ×3 IMPLANT
KIT DILATOR VASC 18G NDL (KITS) ×3 IMPLANT
KIT DRAINAGE VACCUM ASSIST (KITS) ×3 IMPLANT
KIT ROOM TURNOVER OR (KITS) ×3 IMPLANT
KIT SUCTION CATH 14FR (SUCTIONS) ×9 IMPLANT
KIT SUT CK MINI COMBO 4X17 (Prosthesis & Implant Heart) ×3 IMPLANT
LEAD PACING MYOCARDI (MISCELLANEOUS) ×3 IMPLANT
LINE EXTENSION DELIVERY (MISCELLANEOUS) ×3 IMPLANT
LINE VENT (MISCELLANEOUS) ×3 IMPLANT
NEEDLE 27GX1/2 REG BEVEL ECLIP (NEEDLE) ×3 IMPLANT
NEEDLE AORTIC ROOT 14G 7F (CATHETERS) ×3 IMPLANT
NS IRRIG 1000ML POUR BTL (IV SOLUTION) ×15 IMPLANT
PACK OPEN HEART (CUSTOM PROCEDURE TRAY) ×3 IMPLANT
PAD ARMBOARD 7.5X6 YLW CONV (MISCELLANEOUS) ×6 IMPLANT
PAD ELECT DEFIB RADIOL ZOLL (MISCELLANEOUS) ×3 IMPLANT
REDUCER CAP 1SEAL (MISCELLANEOUS) ×3 IMPLANT
RTRCTR WOUND ALEXIS 18CM MED (MISCELLANEOUS) ×3
SENSOR MYOCARDIAL TEMP (MISCELLANEOUS) ×3 IMPLANT
SET CANNULATION TOURNIQUET (MISCELLANEOUS) ×3 IMPLANT
SET CARDIOPLEGIA MPS 5001102 (MISCELLANEOUS) ×3 IMPLANT
SET IRRIG TUBING LAPAROSCOPIC (IRRIGATION / IRRIGATOR) ×3 IMPLANT
SOLUTION ANTI FOG 6CC (MISCELLANEOUS) ×3 IMPLANT
SUT BONE WAX W31G (SUTURE) ×3 IMPLANT
SUT ETHIBOND X763 2 0 SH 1 (SUTURE) ×15 IMPLANT
SUT GORETEX CV 4 TH 22 36 (SUTURE) ×3 IMPLANT
SUT GORETEX CV4 TH-18 (SUTURE) ×6 IMPLANT
SUT PROLENE 3 0 SH DA (SUTURE) ×15 IMPLANT
SUT PROLENE 4 0 RB 1 (SUTURE) ×5
SUT PROLENE 4-0 RB1 .5 CRCL 36 (SUTURE) ×10 IMPLANT
SUT SILK  1 MH (SUTURE) ×2
SUT SILK 1 MH (SUTURE) ×4 IMPLANT
SUT STEEL STERNAL CCS#1 18IN (SUTURE) ×6 IMPLANT
SUT STEEL SZ 6 DBL 3X14 BALL (SUTURE) ×6 IMPLANT
SUT TEM PAC WIRE 2 0 SH (SUTURE) ×6 IMPLANT
SYR 10ML LL (SYRINGE) ×3 IMPLANT
SYSTEM SAHARA CHEST DRAIN ATS (WOUND CARE) ×3 IMPLANT
TAPE CLOTH SURG 4X10 WHT LF (GAUZE/BANDAGES/DRESSINGS) ×3 IMPLANT
TAPE PAPER MEDFIX 1IN X 10YD (GAUZE/BANDAGES/DRESSINGS) ×3 IMPLANT
TOWEL GREEN STERILE (TOWEL DISPOSABLE) ×12 IMPLANT
TOWEL GREEN STERILE FF (TOWEL DISPOSABLE) ×6 IMPLANT
TOWEL OR 17X24 6PK STRL BLUE (TOWEL DISPOSABLE) ×3 IMPLANT
TOWEL OR 17X26 10 PK STRL BLUE (TOWEL DISPOSABLE) ×3 IMPLANT
TRAY FOLEY SILVER 16FR TEMP (SET/KITS/TRAYS/PACK) ×3 IMPLANT
TROCAR XCEL BLADELESS 5X75MML (TROCAR) IMPLANT
TROCAR XCEL NON-BLD 11X100MML (ENDOMECHANICALS) ×6 IMPLANT
TUBE SUCT INTRACARD DLP 20F (MISCELLANEOUS) ×3 IMPLANT
UNDERPAD 30X30 (UNDERPADS AND DIAPERS) ×3 IMPLANT
VALVE SYSTEM EDWS INTUITY 21A (Prosthesis & Implant Heart) ×3 IMPLANT
VENT LEFT HEART 12002 (CATHETERS) ×3
WATER STERILE IRR 1000ML POUR (IV SOLUTION) ×6 IMPLANT
WIRE .035 3MM-J 145CM (WIRE) ×3 IMPLANT

## 2016-09-17 NOTE — Progress Notes (Signed)
Attempted next step in the wean protocol 10/5 and Cp/PS had no patient effort several times and then wob went in the 30' s will try again when patient becomes a little more alert.

## 2016-09-17 NOTE — Interval H&P Note (Signed)
History and Physical Interval Note:  09/17/2016 7:37 AM  Jasmin Allen  has presented today for surgery, with the diagnosis of Aortic Stenosis  The various methods of treatment have been discussed with the patient and family. After consideration of risks, benefits and other options for treatment, the patient has consented to  Procedure(s): MINIMALLY INVASIVE AORTIC VALVE REPLACEMENT (AVR) (N/A) TRANSESOPHAGEAL ECHOCARDIOGRAM (TEE) (N/A) as a surgical intervention .  The patient's history has been reviewed, patient examined, no change in status, stable for surgery.  I have reviewed the patient's chart and labs.  Questions were answered to the patient's satisfaction.     Rexene Alberts

## 2016-09-17 NOTE — Brief Op Note (Addendum)
09/17/2016  12:00 PM  PATIENT:  Tawnee Clegg Hurlbutt  76 y.o. 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

## 2016-09-17 NOTE — Progress Notes (Signed)
      [address]       [place] [address]             [phone]     \ s/p AVR  Intubated, sedated  BP 92/61 (BP Location: Right Arm)   Pulse 65   Temp 98.1 F (36.7 C) (Core (Comment))   Resp 12   Wt 202 lb (91.6 kg)   SpO2 100%   BMI 33.61 kg/m    Intake/Output Summary (Last 24 hours) at 09/17/16 1701 Last data filed at 09/17/16 1627  Gross per 24 hour  Intake          3990.52 ml  Output             2700 ml  Net          1290.52 ml   Ci= 1.7  Hct= 32  Ci a little low should respond to volume with filling P relatively low at present  [place] C. Roxan Hockey, MD [facility] [phone]

## 2016-09-17 NOTE — Anesthesia Procedure Notes (Signed)
Arterial Line Insertion Start/End4/18/2018 7:45 AM

## 2016-09-17 NOTE — Progress Notes (Signed)
Extubated pt placed on 4 lpm [place] tolerating well .Marland Kitchen Pt instructed by Rn on Chiropodist

## 2016-09-17 NOTE — Procedures (Signed)
Extubation Procedure Note  Patient Details:   Name: Jasmin Allen DOB: 1941/04/01 MRN: [id number]   Airway Documentation:  Airway 7.5 mm (Active)  Secured at (cm) 22 cm 09/17/2016  7:24 PM  Measured From Lips 09/17/2016  7:24 PM  Secured Location Right 09/17/2016  7:24 PM  Secured By Pink Tape 09/17/2016  7:24 PM  Site Condition Dry 09/17/2016  7:24 PM    Evaluation  O2 sats: stable throughout Complications: No apparent complications Patient did tolerate procedure well. Bilateral Breath Sounds: Clear     Yes  Wyman Songster [facility] 09/17/2016, 9:08 PM

## 2016-09-17 NOTE — Anesthesia Procedure Notes (Addendum)
Procedure Name: Intubation Date/Time: 09/17/2016 9:01 AM Performed by: Clearnce Sorrel Pre-anesthesia Checklist: Patient identified, Emergency Drugs available, Suction available and Patient being monitored Patient Re-evaluated:Patient Re-evaluated prior to inductionOxygen Delivery Method: Circle System Utilized Preoxygenation: Pre-oxygenation with 100% oxygen Intubation Type: IV induction Ventilation: Oral airway inserted - appropriate to patient size Laryngoscope Size: Sabra Heck and 2 Grade View: Grade I Tube type: Oral Tube size: 7.5 mm Number of attempts: 1 Airway Equipment and Method: Stylet and Oral airway Placement Confirmation: ETT inserted through vocal cords under direct vision,  positive ETCO2 and breath sounds checked- equal and bilateral Secured at: 22 cm Tube secured with: Tape Dental Injury: Teeth and Oropharynx as per pre-operative assessment

## 2016-09-17 NOTE — Progress Notes (Signed)
  Echocardiogram Echocardiogram Transesophageal has been performed.  Jasmin Allen 09/17/2016, 9:50 AM

## 2016-09-17 NOTE — Anesthesia Postprocedure Evaluation (Signed)
Anesthesia Post Note  Patient: Jasmin Allen  Procedure(s) Performed: Procedure(s) (LRB): MINIMALLY INVASIVE AORTIC VALVE REPLACEMENT (AVR) THROUGH PARTIAL STERNOTOMY USING A 21MM EDWARDS INTUITY ELITE VALVE (N/A) TRANSESOPHAGEAL ECHOCARDIOGRAM (TEE) (N/A)  Patient location during evaluation: SICU Anesthesia Type: General Level of consciousness: sedated Pain management: pain level controlled Vital Signs Assessment: post-procedure vital signs reviewed and stable Respiratory status: patient remains intubated per anesthesia plan Cardiovascular status: stable Anesthetic complications: no       Last Vitals:  Vitals:   09/17/16 1529 09/17/16 1600  BP:  92/61  Pulse: 66 65  Resp: 12 12  Temp: 36.4 C 36.7 C    Last Pain:  Vitals:   09/17/16 1600  TempSrc: Core (Comment)                 Tiajuana Amass

## 2016-09-17 NOTE — Transfer of Care (Signed)
Immediate Anesthesia Transfer of Care Note  Patient: Jasmin Allen  Procedure(s) Performed: Procedure(s): MINIMALLY INVASIVE AORTIC VALVE REPLACEMENT (AVR) THROUGH PARTIAL STERNOTOMY USING A 21MM EDWARDS INTUITY ELITE VALVE (N/A) TRANSESOPHAGEAL ECHOCARDIOGRAM (TEE) (N/A)  Patient Location: SICU  Anesthesia Type:General  Level of Consciousness: Patient remains intubated per anesthesia plan  Airway & Oxygen Therapy: Patient remains intubated per anesthesia plan  Post-op Assessment: Report given to RN and Post -op Vital signs reviewed and stable  Post vital signs: Reviewed and stable  Last Vitals:  Vitals:   09/17/16 0636  BP: 126/60  Pulse: 69  Resp: 20  Temp: 36.6 C    Last Pain:  Vitals:   09/17/16 0636  TempSrc: Oral      Patients Stated Pain Goal: 3 (80/22/33 6122)  Complications: No apparent anesthesia complications

## 2016-09-17 NOTE — Anesthesia Procedure Notes (Addendum)
Central Venous Catheter Insertion Performed by: Lillia Abed, anesthesiologist Start/End4/18/2018 7:50 AM, 09/17/2016 8:02 AM Preanesthetic checklist: patient identified, IV checked, risks and benefits discussed, surgical consent, monitors and equipment checked, pre-op evaluation, timeout performed and anesthesia consent Position: Trendelenburg Patient sedated Hand hygiene performed , maximum sterile barriers used  and Seldinger technique used Central line was placed.Sheath introducer Swan type:thermodilution Procedure performed using ultrasound guided technique. Ultrasound Notes:anatomy identified, needle tip was noted to be adjacent to the nerve/plexus identified, no ultrasound evidence of intravascular and/or intraneural injection and image(s) printed for medical record Attempts: 1 Following insertion, line sutured and dressing applied. Post procedure assessment: blood return through all ports, free fluid flow and no air  Patient tolerated the procedure well with no immediate complications.

## 2016-09-17 NOTE — Op Note (Signed)
CARDIOTHORACIC SURGERY OPERATIVE NOTE  Date of Procedure:  09/17/2016  Preoperative Diagnosis: Severe Aortic Stenosis   Postoperative Diagnosis: Same   Procedure:    Minimally Invasive Aortic Valve Replacement  Partial Upper Mini-sternotomy  Edwards Intuity Elite rapid deployment bovine pericardial tissue valve (size 21 mm, model # 8300AB, serial # [id number])   Surgeon: Valentina Gu. Roxy Manns, MD  Assistant: Ellwood Handler, PA-C  Anesthesia: Suzette Battiest, MD  Operative Findings:  Bicuspid aortic valve  Severe aortic stenosis  Normal LV systolic function  Mild to moderate LV hypertrophy          BRIEF CLINICAL NOTE AND INDICATIONS FOR SURGERY  Patient is a 76 year old female with history of bicuspid aortic valve and aortic stenosis referred for elective surgical consultation. The patient states that she was first noted to have a heart murmur on routine physical exam nearly 20 years ago. She has been followed by her primary care physician with echocardiograms which have demonstrated gradual progression of aortic stenosis. Transthoracic echocardiogram performed in 2016 reportedly demonstrated severe aortic stenosis. At the time the patient was relatively asymptomatic and weight loss was recommended. Recently the patient was seen in follow-up by her primary care physician and noted to report mild symptoms of exertional shortness of breath with occasional dizzy spells. Follow-up echocardiogram performed at [facility] revealed severe aortic stenosis with normal left ventricular systolic function. Peak velocity across the aortic valve was reported 4.2 m/s corresponding to mean transvalvular gradient 43 mmHg. Findings were reportedly similar to the echocardiogram performed in 2016. She was seen in consultation by Dr. Stanford Breed and subsequently scheduled for diagnostic cardiac catheterization. Catheterization performed by Dr. Burt Knack revealed normal coronary arteries with  no significant coronary artery disease. The patient has subsequently been referred for elective surgical consultation.  The patient has been seen in consultation and counseled at length regarding the indications, risks and potential benefits of surgery.  All questions have been answered, and the patient provides full informed consent for the operation as described.    DETAILS OF THE OPERATIVE PROCEDURE  Preparation:  The patient is brought to the operating room on the above mentioned date and central monitoring was established by the anesthesia team including placement of Swan-Ganz catheter via left internal jugular approach and left radial arterial line. The patient is placed in the supine position on the operating table.  Intravenous antibiotics are administered. General endotracheal anesthesia is induced uneventfully. A Foley catheter is placed.  Baseline transesophageal echocardiogram was performed.  Findings were notable for bicuspid aortic valve with severe aortic stenosis. There appeared to be fusion of the left and right cusps of the aortic valve with a single raphe.  All of the leaflets were severely thickened and calcified with restricted leaflet mobility. There was trivial aortic insufficiency. There was normal left ventricular systolic function. There was mild to moderate left ventricular hypertrophy.  The patient's chest, abdomen, both groins, and both lower extremities are prepared and draped in a sterile manner. A time out procedure is performed.   Surgical Approach:  A partial upper mini-sternotomy incision was performed.  The sternum is divided in the midline and teed into the right third intercostal space.  The pericardium is opened. The ascending aorta is normal in appearance.    Extracorporeal Cardiopulmonary Bypass and Myocardial Protection:  An introducing sheath for the Edwards Endo vent pulmonary artery catheter placed into the right internal jugular vein using a  Seldinger technique with ultrasound guidance. An Edwards Endo vent pulmonary artery  catheter is then passed through the sheath and floated through the right atrium and right ventricle and into the proximal right common artery while continuously monitoring capillary waveform from the distal end of the catheter.  The right common femoral vein is cannulated with Seldinger technique using ultrasound guidance. A guidewire is passed under TEE guidance through the right atrium into the superior vena cava. The patient is heparinized systemically. The right common femoral vein is cannulated with a long femoral venous cannula under TEE guidance.  The ascending aorta is cannulated for cardiopulmonary bypass.  Adequate heparinization is verified.   The operative field was continuously flooded with carbon dioxide gas.  The entire pre-bypass portion of the operation was notable for stable hemodynamics.  Cardiopulmonary bypass was begun.  A 14 French short straight pediatric femoral venous cannula is placed into the right atrium for added venous drainage because of insufficient drainage with the femoral catheter.  Attempts are made to place a retrograde cardioplegia cannula through the right atrium into the coronary sinus using TEE guidance.  Despite excellent visualization the catheter could not be passed through the os of the coronary sinus, presumably because of a overlying valve.  A left ventricular vent is placed through the right superior pulmonary vein.  A cardioplegia cannula is placed in the ascending aorta.    The patient is cooled to 32C systemic temperature.  The aortic cross clamp is applied and cardioplegia is delivered initially in an antegrade fashion through the aortic root using modified [place] cold blood cardioplegia (KBC protocol).   The initial cardioplegic arrest is rapid with early diastolic arrest.  Myocardial protection was felt to be excellent.   Aortic Valve Replacement:  An oblique  transverse aortotomy incision was performed.  The aortic valve was inspected and notable for bicuspid aortic valve with fusion of the left and right leaflets and a single raphe (Sievers type I).  There was severe aortic stenosis.  The aortic valve leaflets were excised sharply and the aortic annulus decalcified.  Decalcification was notably straightforward.  The aortic annulus was sized to accept a 21 mm prosthesis.  The aortic root and left ventricle were irrigated with copious cold saline solution.  Aortic valve replacement was performed using an [employer] rapid deployment pericardial tissue valve (size 21 mm, model #8300AB, serial # [id number]).  The valve was rinsed in saline for manufacture recommendations. A total of 4 individual guiding sutures are placed through the aortic annulus, each at the corresponding nadir of each sinus of Valsalva with 2 sutures at the base of the non-coronary sinus.  The valve was attached to the delivery device and the 3 guiding sutures placed through the valve sewing cuff. The valve is lowered into place. Care is taken to insure that the valve is completely seated in the annulus. Each of the guide sutures are secured using Cor knot clips.  The valve stent is deployed by inflating the deployment balloon to 4.5 atm pressure and holding for 10 seconds. The balloon is deflated the valve holder sutures cut In the deployment system removed.   The valve is carefully inspected to make sure that it is seated appropriately. Endoscopic visualization through the valve is utilized to inspect the left ventricular outflow tract. Aortic root was filled with saline to make sure the valve is competent. Rewarming is begun.   Procedure Completion:  The aortotomy was closed using a 2-layer closure of running 4-0 Prolene suture.  One final dose of warm "reanimation dose" cardioplegia  was administered antegrade through the aortic root.  The aortic cross clamp was removed after a total  cross clamp time of 63 minutes.  The heart began to be spontaneously without need for cardioversion.  Epicardial pacing wires are fixed to the right ventricular outflow tract and to the right atrial appendage. The patient is rewarmed to 37C temperature. The aortic and left ventricular vents are removed.  A single 4 French Bard chest tube is placed to drain the pericardial sac. The patient is weaned and disconnected from cardiopulmonary bypass.  The patient's rhythm at separation from bypass was sinus.  Usually there was a brief episode of supraventricular tachycardia, but this converted back to sinus rhythm with a single dose of intravenous esmolol.  The patient was weaned from cardioplegic bypass without any inotropic support. Total cardiopulmonary bypass time for the operation was 120 minutes.  Followup transesophageal echocardiogram performed after separation from bypass revealed a well-seated aortic valve prosthesis that was functioning normally and without any sign of perivalvular leak.  Deep transgastric views are obtained from multiple angles to make certain the valve is well-seated.  Mean transvalvular gradient across the valve is estimated 6 mmHg.  Left ventricular function was unchanged from preoperatively.  The aortic cannula was removed uneventfully. Protamine was administered to reverse the anticoagulation. The femoral venous cannula was removed and manual pressure held on the right groin for 30 minutes. The mediastinum and right pleural space were inspected for hemostasis and irrigated with saline solution. The right pleural space were drained using a second chest tube placed through separate stab incisions inferiorly.  The soft tissues anterior to the aorta were reapproximated loosely. The sternum is closed with double strength sternal wire. The soft tissues anterior to the sternum were closed in multiple layers and the skin is closed with a running subcuticular skin closure.  The  post-bypass portion of the operation was notable for stable rhythm and hemodynamics.  The patient received 2 units packed red blood cells during the procedure due to anemia which was present preoperatively and exacerbated by acute blood loss and hemodilution during cardiopulmonary bypass.   Disposition:  The patient tolerated the procedure well and is transported to the surgical intensive care in stable condition. There are no intraoperative complications. All sponge instrument and needle counts are verified correct at completion of the operation.    Valentina Gu. Roxy Manns MD 09/17/2016 1:36 PM

## 2016-09-18 ENCOUNTER — Inpatient Hospital Stay (HOSPITAL_COMMUNITY): Payer: Medicare Other

## 2016-09-18 ENCOUNTER — Encounter (HOSPITAL_COMMUNITY): Payer: Self-pay | Admitting: Thoracic Surgery (Cardiothoracic Vascular Surgery)

## 2016-09-18 LAB — POCT I-STAT 4, (NA,K, GLUC, HGB,HCT)
GLUCOSE: 90 mg/dL (ref 65–99)
HCT: 30 % — ABNORMAL LOW (ref 36.0–46.0)
Hemoglobin: 10.2 g/dL — ABNORMAL LOW (ref 12.0–15.0)
Potassium: 3.3 mmol/L — ABNORMAL LOW (ref 3.5–5.1)
SODIUM: 142 mmol/L (ref 135–145)

## 2016-09-18 LAB — CBC
HCT: 29.8 % — ABNORMAL LOW (ref 36.0–46.0)
HCT: 31.7 % — ABNORMAL LOW (ref 36.0–46.0)
HCT: 33.9 % — ABNORMAL LOW (ref 36.0–46.0)
Hemoglobin: 10.5 g/dL — ABNORMAL LOW (ref 12.0–15.0)
Hemoglobin: 10.9 g/dL — ABNORMAL LOW (ref 12.0–15.0)
Hemoglobin: 11.7 g/dL — ABNORMAL LOW (ref 12.0–15.0)
MCH: 32.9 pg (ref 26.0–34.0)
MCH: 31.9 pg (ref 26.0–34.0)
MCH: 32.9 pg (ref 26.0–34.0)
MCHC: 35.2 g/dL (ref 30.0–36.0)
MCHC: 34.4 g/dL (ref 30.0–36.0)
MCHC: 34.5 g/dL (ref 30.0–36.0)
MCV: 93.4 fL (ref 78.0–100.0)
MCV: 92.7 fL (ref 78.0–100.0)
MCV: 95.2 fL (ref 78.0–100.0)
PLATELETS: 66 10*3/uL — AB (ref 150–400)
PLATELETS: 99 10*3/uL — AB (ref 150–400)
Platelets: 79 10*3/uL — ABNORMAL LOW (ref 150–400)
RBC: 3.19 MIL/uL — ABNORMAL LOW (ref 3.87–5.11)
RBC: 3.42 MIL/uL — ABNORMAL LOW (ref 3.87–5.11)
RBC: 3.56 MIL/uL — AB (ref 3.87–5.11)
RDW: 14.7 % (ref 11.5–15.5)
RDW: 15 % (ref 11.5–15.5)
RDW: 15.8 % — ABNORMAL HIGH (ref 11.5–15.5)
WBC: 5.1 10*3/uL (ref 4.0–10.5)
WBC: 7.2 10*3/uL (ref 4.0–10.5)
WBC: 9.7 10*3/uL (ref 4.0–10.5)

## 2016-09-18 LAB — BASIC METABOLIC PANEL
Anion gap: 6 (ref 5–15)
BUN: 12 mg/dL (ref 6–20)
CO2: 22 mmol/L (ref 22–32)
CREATININE: 0.75 mg/dL (ref 0.44–1.00)
Calcium: 7.7 mg/dL — ABNORMAL LOW (ref 8.9–10.3)
Chloride: 109 mmol/L (ref 101–111)
GFR calc non Af Amer: >60 mL/min (ref >60)
Glucose, Bld: 154 mg/dL — ABNORMAL HIGH (ref 65–99)
Potassium: 3.6 mmol/L (ref 3.5–5.1)
SODIUM: 137 mmol/L (ref 135–145)

## 2016-09-18 LAB — POCT I-STAT, CHEM 8
BUN: 17 mg/dL (ref 6–20)
CREATININE: 1 mg/dL (ref 0.44–1.00)
Calcium, Ion: 1.19 mmol/L (ref 1.15–1.40)
Chloride: 103 mmol/L (ref 101–111)
GLUCOSE: 158 mg/dL — AB (ref 65–99)
HCT: 33 % — ABNORMAL LOW (ref 36.0–46.0)
HEMOGLOBIN: 11.2 g/dL — AB (ref 12.0–15.0)
POTASSIUM: 4.6 mmol/L (ref 3.5–5.1)
Sodium: 138 mmol/L (ref 135–145)
TCO2: 23 mmol/L (ref 0–100)

## 2016-09-18 LAB — GLUCOSE, CAPILLARY
GLUCOSE-CAPILLARY: 141 mg/dL — AB (ref 65–99)
Glucose-Capillary: 111 mg/dL — ABNORMAL HIGH (ref 65–99)
Glucose-Capillary: 134 mg/dL — ABNORMAL HIGH (ref 65–99)
Glucose-Capillary: 134 mg/dL — ABNORMAL HIGH (ref 65–99)
Glucose-Capillary: 166 mg/dL — ABNORMAL HIGH (ref 65–99)
Glucose-Capillary: 168 mg/dL — ABNORMAL HIGH (ref 65–99)

## 2016-09-18 LAB — CREATININE, SERUM
CREATININE: 0.97 mg/dL (ref 0.44–1.00)
GFR, EST NON AFRICAN AMERICAN: 56 mL/min — AB (ref >60)

## 2016-09-18 LAB — MAGNESIUM
MAGNESIUM: 2.5 mg/dL — AB (ref 1.7–2.4)
Magnesium: 2.3 mg/dL (ref 1.7–2.4)

## 2016-09-18 MED ORDER — CARVEDILOL 6.25 MG PO TABS
6.2500 mg | ORAL_TABLET | Freq: Two times a day (BID) | ORAL | Status: DC
  Administered 2016-09-18 – 2016-09-22 (×9): 6.25 mg via ORAL
  Filled 2016-09-18 (×10): qty 1

## 2016-09-18 MED ORDER — FOLIC ACID 400 MCG PO TABS: 400.0000 ug | ORAL_TABLET | Freq: Every day | ORAL | Status: DC

## 2016-09-18 MED ORDER — CHLORHEXIDINE GLUCONATE CLOTH 2 % EX PADS
6.0000 | MEDICATED_PAD | Freq: Every day | CUTANEOUS | Status: DC
  Administered 2016-09-19: 6 via TOPICAL

## 2016-09-18 MED ORDER — FOLIC ACID 1 MG PO TABS
0.5000 mg | ORAL_TABLET | Freq: Every day | ORAL | Status: DC
  Administered 2016-09-19 – 2016-09-21 (×3): 0.5 mg via ORAL
  Filled 2016-09-18 (×3): qty 1

## 2016-09-18 MED ORDER — PRAVASTATIN SODIUM 20 MG PO TABS
20.0000 mg | ORAL_TABLET | Freq: Every day | ORAL | Status: DC
  Administered 2016-09-20 – 2016-09-21 (×2): 20 mg via ORAL
  Filled 2016-09-18 (×2): qty 1

## 2016-09-18 MED ORDER — LOSARTAN POTASSIUM 25 MG PO TABS
25.0000 mg | ORAL_TABLET | Freq: Every day | ORAL | Status: DC
  Filled 2016-09-18: qty 1

## 2016-09-18 MED ORDER — FUROSEMIDE 10 MG/ML IJ SOLN
20.0000 mg | Freq: Four times a day (QID) | INTRAMUSCULAR | Status: AC
  Administered 2016-09-18 (×3): 20 mg via INTRAVENOUS
  Filled 2016-09-18 (×3): qty 2

## 2016-09-18 MED ORDER — SODIUM CHLORIDE 0.9 % IV SOLN
30.0000 meq | Freq: Once | INTRAVENOUS | Status: AC
  Administered 2016-09-18: 30 meq via INTRAVENOUS
  Filled 2016-09-18: qty 15

## 2016-09-18 MED ORDER — ORAL CARE MOUTH RINSE
15.0000 mL | Freq: Two times a day (BID) | OROMUCOSAL | Status: DC
  Administered 2016-09-18 – 2016-09-22 (×4): 15 mL via OROMUCOSAL

## 2016-09-18 MED ORDER — SODIUM CHLORIDE 0.9% FLUSH
10.0000 mL | Freq: Two times a day (BID) | INTRAVENOUS | Status: DC
Start: 2016-09-18 — End: 2016-09-22
  Administered 2016-09-20: 10 mL

## 2016-09-18 MED ORDER — VITAMIN B-12 1000 MCG PO TABS
1000.0000 ug | ORAL_TABLET | Freq: Every evening | ORAL | Status: DC
Start: 2016-09-19 — End: 2016-09-22
  Administered 2016-09-19 – 2016-09-21 (×3): 1000 ug via ORAL
  Filled 2016-09-18 (×3): qty 1

## 2016-09-18 MED ORDER — ENOXAPARIN SODIUM 30 MG/0.3ML ~~LOC~~ SOLN: 30.0000 mg | Freq: Every day | SUBCUTANEOUS | Status: DC

## 2016-09-18 MED ORDER — METOCLOPRAMIDE HCL 5 MG/ML IJ SOLN
10.0000 mg | Freq: Four times a day (QID) | INTRAMUSCULAR | Status: DC | PRN
  Administered 2016-09-18: 10 mg via INTRAVENOUS
  Filled 2016-09-18: qty 2

## 2016-09-18 MED ORDER — SODIUM CHLORIDE 0.9% FLUSH: 10.0000 mL | INTRAVENOUS | Status: DC | PRN

## 2016-09-18 MED FILL — Sodium Chloride IV Soln 0.9%: INTRAVENOUS | Qty: 2000 | Status: AC

## 2016-09-18 MED FILL — Mannitol IV Soln 20%: INTRAVENOUS | Qty: 500 | Status: AC

## 2016-09-18 MED FILL — Lidocaine HCl IV Inj 20 MG/ML: INTRAVENOUS | Qty: 5 | Status: AC

## 2016-09-18 MED FILL — Sodium Bicarbonate IV Soln 8.4%: INTRAVENOUS | Qty: 50 | Status: AC

## 2016-09-18 MED FILL — Lidocaine HCl IV Inj 20 MG/ML: INTRAVENOUS | Qty: 5 | Status: CN

## 2016-09-18 MED FILL — Heparin Sodium (Porcine) Inj 1000 Unit/ML: INTRAMUSCULAR | Qty: 10 | Status: CN

## 2016-09-18 MED FILL — Sodium Chloride IV Soln 0.9%: INTRAVENOUS | Qty: 2000 | Status: CN

## 2016-09-18 MED FILL — Albumin, Human Inj 5%: INTRAVENOUS | Qty: 250 | Status: CN

## 2016-09-18 MED FILL — Sodium Bicarbonate IV Soln 8.4%: INTRAVENOUS | Qty: 50 | Status: CN

## 2016-09-18 MED FILL — Mannitol IV Soln 20%: INTRAVENOUS | Qty: 500 | Status: CN

## 2016-09-18 MED FILL — Albumin, Human Inj 5%: INTRAVENOUS | Qty: 250 | Status: AC

## 2016-09-18 MED FILL — Electrolyte-R (PH 7.4) Solution: INTRAVENOUS | Qty: 4000 | Status: CN

## 2016-09-18 MED FILL — Heparin Sodium (Porcine) Inj 1000 Unit/ML: INTRAMUSCULAR | Qty: 10 | Status: AC

## 2016-09-18 MED FILL — Electrolyte-R (PH 7.4) Solution: INTRAVENOUS | Qty: 4000 | Status: AC

## 2016-09-18 NOTE — Plan of Care (Addendum)
Problem: Bowel/Gastric: Goal: Will not experience complications related to bowel motility Outcome: Progressing .   Comments: Will continue to monitor the patient.

## 2016-09-18 NOTE — NC FL2 (Signed)
[place] LEVEL OF CARE SCREENING TOOL     IDENTIFICATION  Patient Name: Jasmin Allen Birthdate: [birth year] Sex: female Admission Date (Current Location): 09/17/2016  [facility] and [place] Number:   ([place])   Facility and Address:  [facility], [place] [address], [place], [place] [address]      Provider Number: [id number]  Attending Physician Name and Address:  Rexene Alberts, MD  Relative Name and Phone Number:       Current Level of Care: Hospital Recommended Level of Care: [place] Prior Approval Number:    Date Approved/Denied:   PASRR Number:    Discharge Plan: SNF    Current Diagnoses: Patient Active Problem List   Diagnosis Date Noted  . S/P aortic valve replacement with bioprosthetic valve 09/17/2016  . S/P AVR (aortic valve replacement) 09/17/2016  . Essential hypertension   . Hyperlipidemia   . Chronic diastolic congestive heart failure ([place])   . Severe aortic stenosis 08/14/2016  . Anemia 03/02/2016  . Lymphadenopathy of left cervical region 04/07/2012    Orientation RESPIRATION BLADDER Height & Weight     Self, Time, Situation, Place  O2 (2L [place]) Incontinent, Indwelling catheter Weight: 212 lb 8.4 oz (96.4 kg) Height:     BEHAVIORAL SYMPTOMS/MOOD NEUROLOGICAL BOWEL NUTRITION STATUS      Continent Diet  AMBULATORY STATUS COMMUNICATION OF NEEDS Skin   Limited Assist Verbally Surgical wounds                       Personal Care Assistance Level of Assistance  Bathing, Dressing Bathing Assistance: Limited assistance   Dressing Assistance: Limited assistance     Functional Limitations Info             SPECIAL CARE FACTORS FREQUENCY  PT (By licensed PT), OT (By licensed OT)     PT Frequency: 5/wk OT Frequency: 5/wk            Contractures      Additional Factors Info  Code Status, Allergies, Insulin Sliding Scale Code Status Info: FULL Allergies Info: Penicillins, Sulfa  Antibiotics   Insulin Sliding Scale Info: 6/day       Current Medications (09/18/2016):  This is the current hospital active medication list Current Facility-Administered Medications  Medication Dose Route Frequency Provider Last Rate Last Dose  . 0.9 %  sodium chloride infusion  250 mL Intravenous Continuous Rexene Alberts, MD      . acetaminophen (TYLENOL) tablet 1,000 mg  1,000 mg Oral Q6H Rexene Alberts, MD   1,000 mg at 09/18/16 1204  . aspirin EC tablet 325 mg  325 mg Oral Daily Rexene Alberts, MD   325 mg at 09/18/16 0900  . bisacodyl (DULCOLAX) EC tablet 10 mg  10 mg Oral Daily Rexene Alberts, MD   10 mg at 09/18/16 0900   Or  . bisacodyl (DULCOLAX) suppository 10 mg  10 mg Rectal Daily Rexene Alberts, MD      . carvedilol (COREG) tablet 6.25 mg  6.25 mg Oral BID WC Rexene Alberts, MD   6.25 mg at 09/18/16 0855  . docusate sodium (COLACE) capsule 200 mg  200 mg Oral Daily Rexene Alberts, MD   200 mg at 09/18/16 0900  . [START ON 07/24/9796] folic acid (FOLVITE) tablet 0.5 mg  0.5 mg Oral QHS Rexene Alberts, MD      . furosemide (LASIX) injection 20 mg  20 mg Intravenous Q6H Rexene Alberts, MD   20 mg at 09/18/16 1407  . insulin aspart (novoLOG) injection 0-24 Units  0-24 Units Subcutaneous Q4H Rexene Alberts, MD   2 Units at 09/18/16 1205  . labetalol (NORMODYNE,TRANDATE) injection 10 mg  10 mg Intravenous Q10 min PRN Rexene Alberts, MD   10 mg at 09/17/16 1456  . lactated ringers infusion   Intravenous Continuous Rexene Alberts, MD 20 mL/hr at 09/17/16 1523    . losartan (COZAAR) tablet 25 mg  25 mg Oral QHS Rexene Alberts, MD      . metoCLOPramide (REGLAN) injection 10 mg  10 mg Intravenous Q6H PRN Rexene Alberts, MD      . metoprolol (LOPRESSOR) injection 2.5-5 mg  2.5-5 mg Intravenous Q2H PRN Rexene Alberts, MD      . morphine 4 MG/ML injection 1-2 mg  1-2 mg Intravenous Q1H PRN Rexene Alberts, MD   2 mg at 09/18/16 1044  . ondansetron (ZOFRAN) injection 4 mg  4 mg  Intravenous Q6H PRN Rexene Alberts, MD   4 mg at 09/18/16 1044  . oxyCODONE (Oxy IR/ROXICODONE) immediate release tablet 5-10 mg  5-10 mg Oral Q3H PRN Rexene Alberts, MD   5 mg at 09/18/16 1037  . [START ON 09/19/2016] pantoprazole (PROTONIX) EC tablet 40 mg  40 mg Oral Daily Rexene Alberts, MD      . Derrill Memo ON 09/20/2016] pravastatin (PRAVACHOL) tablet 20 mg  20 mg Oral QHS Rexene Alberts, MD      . sodium chloride flush (NS) 0.9 % injection 3 mL  3 mL Intravenous Q12H Rexene Alberts, MD   3 mL at 09/18/16 0900  . sodium chloride flush (NS) 0.9 % injection 3 mL  3 mL Intravenous PRN Rexene Alberts, MD      . traMADol Veatrice Bourbon) tablet 50-100 mg  50-100 mg Oral Q4H PRN Rexene Alberts, MD      . Derrill Memo ON 09/19/2016] vitamin B-12 (CYANOCOBALAMIN) tablet 1,000 mcg  1,000 mcg Oral QPM Rexene Alberts, MD         Discharge Medications: Please see discharge summary for a list of discharge medications.  Relevant Imaging Results:  Relevant Lab Results:   Additional Information SS#: [id number]  Jorge Ny, LCSW

## 2016-09-18 NOTE — Progress Notes (Signed)
Patient ID: Jasmin Allen, female   DOB: December 03, 1940, 76 y.o.   MRN: [id number]  SICU Evening Rounds:   Hemodynamically stable   Some nausea today but ambulated  Urine output good    CBC    Component Value Date/Time   WBC 9.7 09/18/2016 1706   RBC 3.56 (L) 09/18/2016 1706   HGB 11.2 (L) 09/18/2016 1711   HCT 33.0 (L) 09/18/2016 1711   PLT 99 (L) 09/18/2016 1706   MCV 95.2 09/18/2016 1706   MCH 32.9 09/18/2016 1706   MCHC 34.5 09/18/2016 1706   RDW 15.8 (H) 09/18/2016 1706   LYMPHSABS 1.5 02/22/2016 1227   MONOABS 0.3 02/22/2016 1227   EOSABS 0.0 02/22/2016 1227   BASOSABS 0.0 02/22/2016 1227     BMET    Component Value Date/Time   NA 138 09/18/2016 1711   NA 140 07/09/2016 1214   K 4.6 09/18/2016 1711   CL 103 09/18/2016 1711   CO2 22 09/18/2016 0358   GLUCOSE 158 (H) 09/18/2016 1711   BUN 17 09/18/2016 1711   BUN 13 07/09/2016 1214   CREATININE 1.00 09/18/2016 1711   CREATININE 0.85 08/07/2016 0909   CALCIUM 7.7 (L) 09/18/2016 0358   GFRNONAA 56 (L) 09/18/2016 1706   GFRAA >60 09/18/2016 1706     A/P:  Stable postop course. Continue current plans

## 2016-09-18 NOTE — Clinical Social Work Note (Signed)
Clinical Social Work Assessment  Patient Details  Name: Jasmin Allen MRN: [id number] Date of Birth: [birth year]  Date of referral:  09/18/16               Reason for consult:  Facility Placement                Permission sought to share information with:  Facility Sport and exercise psychologist, Family Supports Permission granted to share information::  Yes, Verbal Permission Granted  Name::     Nurse, learning disability::  [facility]  Relationship::  sister  Contact Information:     Housing/Transportation Living arrangements for the past 2 months:  Single Family Home Source of Information:  Patient Patient Interpreter Needed:  None Criminal Activity/Legal Involvement Pertinent to Current Situation/Hospitalization:  No - Comment as needed Significant Relationships:  Siblings Lives with:  Self Do you feel safe going back to the place where you live?  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  Pt had thought about going to rehab following surgery and was interested in [facility] in [place], [place].    Employment status:  Retired Forensic scientist:  Medicare PT Recommendations:  Not assessed at this time Information / Referral to community resources:     Patient/Family's Response to care: Pt very agreeable with plan and motivated to go to rehab and regain her mobility.  Patient/Family's Understanding of and Emotional Response to Diagnosis, Current Treatment, and Prognosis:  Pt realistic about her needs and prepared to go to rehab- hopeful that she can return home after a short stay.  Emotional Assessment Appearance:  Appears stated age Attitude/Demeanor/Rapport:    Affect (typically observed):  Appropriate Orientation:   Oriented to Situation, Oriented to  Time, Oriented to Place, Oriented to Self Alcohol / Substance use:  Not Applicable Psych involvement (Current and /or in the community):  No (Comment)  Discharge Needs  Concerns to be addressed:  Care Coordination Readmission within the last 30 days:  No Current discharge risk:  Physical Impairment Barriers to Discharge:  Continued Medical Work up   Jorge Ny, LCSW 09/18/2016, 4:09 PM

## 2016-09-18 NOTE — Progress Notes (Addendum)
[address]       [place], [address]             [phone]        CARDIOTHORACIC SURGERY PROGRESS NOTE   R1 Day Post-Op Procedure(s) (LRB): MINIMALLY INVASIVE AORTIC VALVE REPLACEMENT (AVR) THROUGH PARTIAL STERNOTOMY USING A 21MM EDWARDS INTUITY ELITE VALVE (N/A) TRANSESOPHAGEAL ECHOCARDIOGRAM (TEE) (N/A)  Subjective: Looks good.  Mild soreness in chest.  Otherwise feels well.  Objective: Vital signs: BP Readings from Last 1 Encounters:  09/18/16 (!) 109/53   Pulse Readings from Last 1 Encounters:  09/18/16 80   Resp Readings from Last 1 Encounters:  09/18/16 16   Temp Readings from Last 1 Encounters:  09/18/16 99.5 F (37.5 C)    Hemodynamics: PAP: (18-35)/(8-18) 32/16 CO:  [3.4 L/min-5.7 L/min] 4.3 L/min CI:  [1.7 L/min/m2-2.8 L/min/m2] 2.2 L/min/m2  Physical Exam:  Rhythm:   sinus  Breath sounds: clear  Heart sounds:  RRR  Incisions:  Dressing dry, intact  Abdomen:  Soft, non-distended, non-tender  Extremities:  Warm, well-perfused  Chest tubes:  low volume thin serosanguinous output, no air leak    Intake/Output from previous day: 04/18 0701 - 04/19 0700 In: 5851.1 [I.V.:3586.1; Blood:750; IV Piggyback:1515] Out: 4696 [Urine:2375; Blood:1200; Chest Tube:710] Intake/Output this shift: No intake/output data recorded.  Lab Results:  CBC: Recent Labs  09/17/16 2209 09/18/16 0358  WBC 5.1 7.2  HGB 10.5* 10.9*  HCT 29.8* 31.7*  PLT 66* 79*    BMET:  Recent Labs  09/15/16 1239  09/17/16 2204 09/17/16 2209 09/18/16 0358  NA 141  < > 142  --  137  K 3.6  < > 3.8  --  3.6  CL 107  < > 107  --  109  CO2 24  --   --   --  22  GLUCOSE 117*  < > 165*  --  154*  BUN 17  < > 12  --  12  CREATININE 0.82  < > 0.60 0.83 0.75  CALCIUM 9.3  --   --   --  7.7*  < > = values in this interval not displayed.   PT/INR:   Recent Labs  09/17/16 1451  LABPROT 17.7*  INR 1.45    CBG (last 3)   Recent Labs  09/17/16 2004  09/17/16 2322 09/18/16 0406  GLUCAP 103* 148* 141*    ABG    Component Value Date/Time   PHART 7.304 (L) 09/17/2016 2204   PCO2ART 39.0 09/17/2016 2204   PO2ART 92.0 09/17/2016 2204   HCO3 19.4 (L) 09/17/2016 2204   TCO2 21 09/17/2016 2204   TCO2 21 09/17/2016 2204   ACIDBASEDEF 6.0 (H) 09/17/2016 2204   O2SAT 96.0 09/17/2016 2204    CXR: Looks good, mild bibasilar atelectasis and pulm vasc congestion  Assessment/Plan: S/P Procedure(s) (LRB): MINIMALLY INVASIVE AORTIC VALVE REPLACEMENT (AVR) THROUGH PARTIAL STERNOTOMY USING A 21MM EDWARDS INTUITY ELITE VALVE (N/A) TRANSESOPHAGEAL ECHOCARDIOGRAM (TEE) (N/A)  Doing well POD1 Maintaining NSR w/ stable hemodynamics, no drips Breathing comfortably w/ O2 sats 99-100% on 4 L/min Expected post op acute blood loss anemia with mild anemia preop, Hgb 10.9 stable Expected post op atelectasis, mild Chronic diastolic CHF with expected post-op volume excess, mild Post op thrombocytopenia, platelet count up 79k  Hypertension, well controlled   Mobilize  Diuresis  Pulm toilet  D/C lines  D/C tubes later today or tomorrow depending on output  Restart Coreg and restart losartan  at reduced dose  Watch platelet count and hold lovenox   Rexene Alberts, MD 09/18/2016 7:34 AM

## 2016-09-19 ENCOUNTER — Inpatient Hospital Stay (HOSPITAL_COMMUNITY): Payer: Medicare Other

## 2016-09-19 LAB — BASIC METABOLIC PANEL
ANION GAP: 8 (ref 5–15)
BUN: 21 mg/dL — ABNORMAL HIGH (ref 6–20)
CALCIUM: 8 mg/dL — AB (ref 8.9–10.3)
CO2: 24 mmol/L (ref 22–32)
Chloride: 103 mmol/L (ref 101–111)
Creatinine, Ser: 1.39 mg/dL — ABNORMAL HIGH (ref 0.44–1.00)
GFR calc Af Amer: 42 mL/min — ABNORMAL LOW (ref >60)
GFR, EST NON AFRICAN AMERICAN: 36 mL/min — AB (ref >60)
GLUCOSE: 134 mg/dL — AB (ref 65–99)
POTASSIUM: 4.4 mmol/L (ref 3.5–5.1)
Sodium: 135 mmol/L (ref 135–145)

## 2016-09-19 LAB — GLUCOSE, CAPILLARY
GLUCOSE-CAPILLARY: 145 mg/dL — AB (ref 65–99)
GLUCOSE-CAPILLARY: 130 mg/dL — AB (ref 65–99)
Glucose-Capillary: 117 mg/dL — ABNORMAL HIGH (ref 65–99)

## 2016-09-19 LAB — CBC
HCT: 31.7 % — ABNORMAL LOW (ref 36.0–46.0)
HEMOGLOBIN: 10.6 g/dL — AB (ref 12.0–15.0)
MCH: 32.1 pg (ref 26.0–34.0)
MCHC: 33.4 g/dL (ref 30.0–36.0)
MCV: 96.1 fL (ref 78.0–100.0)
Platelets: 99 10*3/uL — ABNORMAL LOW (ref 150–400)
RBC: 3.3 MIL/uL — AB (ref 3.87–5.11)
RDW: 15.4 % (ref 11.5–15.5)
WBC: 8 10*3/uL (ref 4.0–10.5)

## 2016-09-19 LAB — ECHO TEE
AV Area VTI: 0.96 cm2
AV Mean grad: 32 mmHg
AV Peak grad: 59 mmHg

## 2016-09-19 MED ORDER — POTASSIUM CHLORIDE CRYS ER 20 MEQ PO TBCR
40.0000 meq | EXTENDED_RELEASE_TABLET | Freq: Every day | ORAL | Status: DC
  Administered 2016-09-20 – 2016-09-21 (×2): 40 meq via ORAL
  Filled 2016-09-19 (×4): qty 2

## 2016-09-19 MED ORDER — ENOXAPARIN SODIUM 30 MG/0.3ML ~~LOC~~ SOLN
30.0000 mg | SUBCUTANEOUS | Status: DC
  Administered 2016-09-19 – 2016-09-22 (×4): 30 mg via SUBCUTANEOUS
  Filled 2016-09-19 (×4): qty 0.3

## 2016-09-19 MED ORDER — FUROSEMIDE 40 MG PO TABS
40.0000 mg | ORAL_TABLET | Freq: Every day | ORAL | Status: DC
  Administered 2016-09-20 – 2016-09-21 (×2): 40 mg via ORAL
  Filled 2016-09-19 (×3): qty 1

## 2016-09-19 MED ORDER — ALBUTEROL SULFATE (2.5 MG/3ML) 0.083% IN NEBU
2.5000 mg | INHALATION_SOLUTION | Freq: Four times a day (QID) | RESPIRATORY_TRACT | Status: DC | PRN
  Administered 2016-09-19 – 2016-09-21 (×3): 2.5 mg via RESPIRATORY_TRACT
  Filled 2016-09-19 (×5): qty 3

## 2016-09-19 MED ORDER — SODIUM CHLORIDE 0.9% FLUSH
3.0000 mL | Freq: Two times a day (BID) | INTRAVENOUS | Status: DC
  Administered 2016-09-19 – 2016-09-22 (×5): 3 mL via INTRAVENOUS

## 2016-09-19 MED ORDER — MOVING RIGHT ALONG BOOK
Freq: Once | Status: DC
  Filled 2016-09-19: qty 1

## 2016-09-19 MED ORDER — SODIUM CHLORIDE 0.9% FLUSH: 3.0000 mL | INTRAVENOUS | Status: DC | PRN

## 2016-09-19 MED ORDER — FUROSEMIDE 10 MG/ML IJ SOLN
40.0000 mg | Freq: Once | INTRAMUSCULAR | Status: AC
  Administered 2016-09-19: 40 mg via INTRAVENOUS
  Filled 2016-09-19: qty 4

## 2016-09-19 MED ORDER — METOCLOPRAMIDE HCL 5 MG/ML IJ SOLN
10.0000 mg | Freq: Four times a day (QID) | INTRAMUSCULAR | Status: DC | PRN
  Administered 2016-09-19: 10 mg via INTRAVENOUS
  Filled 2016-09-19: qty 2

## 2016-09-19 MED ORDER — SODIUM CHLORIDE 0.9 % IV SOLN: 250.0000 mL | INTRAVENOUS | Status: DC | PRN

## 2016-09-19 NOTE — Discharge Instructions (Signed)
Aortic Valve Replacement, Care After °Refer to this sheet in the next few weeks. These instructions provide you with information about caring for yourself after your procedure. Your health care provider may also give you more specific instructions. Your treatment has been planned according to current medical practices, but problems sometimes occur. Call your health care provider if you have any problems or questions after your procedure. °What can I expect after the procedure? °After the procedure, it is common to have: °· Pain around your incision area. °· A small amount of blood or clear fluid coming from your incision. ° °Follow these instructions at home: °Eating and drinking ° °· Follow instructions from your health care provider about eating or drinking restrictions. °? Limit alcohol intake to no more than 1 drink per day for nonpregnant women and 2 drinks per day for men. One drink equals 12 oz of beer, 5 oz of wine, or 1½ oz of hard liquor. °? Limit how much caffeine you drink. Caffeine can affect your heart's rate and rhythm. °· Drink enough fluid to keep your urine clear or pale yellow. °· Eat a heart-healthy diet. This should include plenty of fresh fruits and vegetables. If you eat meat, it should be lean cuts. Avoid foods that are: °? High in salt, saturated fat, or sugar. °? Canned or highly processed. °? Fried. °Activity °· Return to your normal activities as told by your health care provider. Ask your health care provider what activities are safe for you. °· Exercise regularly once you have recovered, as told by your health care provider. °· Avoid sitting for more than 2 hours at a time without moving. Get up and move around at least once every 1-2 hours. This helps to prevent blood clots in the legs. °· Do not lift anything that is heavier than 10 lb (4.5 kg) until your health care provider approves. °· Avoid pushing or pulling things with your arms until your health care provider approves. This  includes pulling on handrails to help you climb stairs. °Incision care ° °· Follow instructions from your health care provider about how to take care of your incision. Make sure you: °? Wash your hands with soap and water before you change your bandage (dressing). If soap and water are not available, use hand sanitizer. °? Change your dressing as told by your health care provider. °? Leave stitches (sutures), skin glue, or adhesive strips in place. These skin closures may need to stay in place for 2 weeks or longer. If adhesive strip edges start to loosen and curl up, you may trim the loose edges. Do not remove adhesive strips completely unless your health care provider tells you to do that. °· Check your incision area every day for signs of infection. Check for: °? More redness, swelling, or pain. °? More fluid or blood. °? Warmth. °? Pus or a bad smell. °Medicines °· Take over-the-counter and prescription medicines only as told by your health care provider. °· If you were prescribed an antibiotic medicine, take it as told by your health care provider. Do not stop taking the antibiotic even if you start to feel better. °Travel °· Avoid airplane travel for as long as told by your health care provider. °· When you travel, bring a list of your medicines and a record of your medical history with you. Carry your medicines with you. °Driving °· Ask your health care provider when it is safe for you to drive. Do not drive until your health   care provider approves. °· Do not drive or operate heavy machinery while taking prescription pain medicine. °Lifestyle ° °· Do not use any tobacco products, such as cigarettes, chewing tobacco, or e-cigarettes. If you need help quitting, ask your health care provider. °· Resume sexual activity as told by your health care provider. Do not use medicines for erectile dysfunction unless your health care provider approves, if this applies. °· Work with your health care provider to keep your  blood pressure and cholesterol under control, and to manage any other heart conditions that you have. °· Maintain a healthy weight. °General instructions °· Do not take baths, swim, or use a hot tub until your health care provider approves. °· Do not strain to have a bowel movement. °· Avoid crossing your legs while sitting down. °· Check your temperature every day for a fever. A fever may be a sign of infection. °· If you are a woman and you plan to become pregnant, talk with your health care provider before you become pregnant. °· Wear compression stockings if your health care provider instructs you to do this. These stockings help to prevent blood clots and reduce swelling in your legs. °· Tell all health care providers who care for you that you have an artificial (prosthetic) aortic valve. If you have or have had heart disease or endocarditis, tell all health care providers about these conditions as well. °· Keep all follow-up visits as told by your health care provider. This is important. °Contact a health care provider if: °· You develop a skin rash. °· You experience sudden, unexplained changes in your weight. °· You have more redness, swelling, or pain around your incision. °· You have more fluid or blood coming from your incision. °· Your incision feels warm to the touch. °· You have pus or a bad smell coming from your incision. °· You have a fever. °Get help right away if: °· You develop chest pain that is different from the pain coming from your incision. °· You develop shortness of breath or difficulty breathing. °· You start to feel light-headed. °These symptoms may represent a serious problem that is an emergency. Do not wait to see if the symptoms will go away. Get medical help right away. Call your local emergency services (911 in the [place] Do not drive yourself to the hospital. °This information is not intended to replace advice given to you by your health care provider. Make sure you discuss any  questions you have with your health care provider. °Document Released: 12/05/2004 Document Revised: 10/25/2015 Document Reviewed: 04/22/2015 °Elsevier Interactive Patient Education © 2017 [employer] Inc[employer] ° °

## 2016-09-19 NOTE — Progress Notes (Addendum)
TCTS DAILY ICU PROGRESS NOTE                   [address]            [place] [address]          [phone]   2 Days Post-Op Procedure(s) (LRB): MINIMALLY INVASIVE AORTIC VALVE REPLACEMENT (AVR) THROUGH PARTIAL STERNOTOMY USING A 21MM EDWARDS INTUITY ELITE VALVE (N/A) TRANSESOPHAGEAL ECHOCARDIOGRAM (TEE) (N/A)  Total Length of Stay:  LOS: 2 days   Subjective:  Patient feeling better today.  She had 4 episodes of vomiting yesterday.  She no longer feels nauseated.  Has been able to keep some juice down so far this morning.  No flatus or BM yet  Objective: Vital signs in last 24 hours: Temp:  [98.2 F (36.8 C)-99.2 F (37.3 C)] 99.2 F (37.3 C) (04/20 0400) Pulse Rate:  [80-92] 92 (04/20 0700) Cardiac Rhythm: Normal sinus rhythm (04/20 0400) Resp:  [12-26] 16 (04/20 0700) BP: (82-129)/(45-72) 117/62 (04/20 0700) SpO2:  [96 %-100 %] 96 % (04/20 0700) Arterial Line BP: (130-139)/(49-51) 139/51 (04/19 0900) Weight:  [211 lb 6.7 oz (95.9 kg)] 211 lb 6.7 oz (95.9 kg) (04/20 0500)  Filed Weights   09/17/16 0636 09/18/16 0500 09/19/16 0500  Weight: 202 lb (91.6 kg) 212 lb 8.4 oz (96.4 kg) 211 lb 6.7 oz (95.9 kg)    Weight change: -1 lb 1.6 oz (-0.5 kg)   Hemodynamic parameters for last 24 hours: PAP: (36-37)/(16-17) 37/16  Intake/Output from previous day: 04/19 0701 - 04/20 0700 In: 1370 [P.O.:200; I.V.:490; IV Piggyback:680] Out: 1275 [Urine:975; Chest Tube:300]  Current Meds: Scheduled Meds: . acetaminophen  1,000 mg Oral Q6H  . aspirin EC  325 mg Oral Daily  . bisacodyl  10 mg Oral Daily   Or  . bisacodyl  10 mg Rectal Daily  . carvedilol  6.25 mg Oral BID WC  . Chlorhexidine Gluconate Cloth  6 each Topical Daily  . docusate sodium  200 mg Oral Daily  . folic acid  0.5 mg Oral QHS  . furosemide  40 mg Intravenous Once  . [START ON 09/20/2016] furosemide  40 mg Oral Daily  . mouth rinse  15 mL Mouth Rinse BID  . moving right along book   Does  not apply Once  . pantoprazole  40 mg Oral Daily  . [START ON 09/20/2016] potassium chloride  40 mEq Oral Daily  . [START ON 09/20/2016] pravastatin  20 mg Oral QHS  . sodium chloride flush  10-40 mL Intracatheter Q12H  . sodium chloride flush  3 mL Intravenous Q12H  . sodium chloride flush  3 mL Intravenous Q12H  . vitamin B-12  1,000 mcg Oral QPM   Continuous Infusions: . sodium chloride    . sodium chloride    . lactated ringers 20 mL/hr at 09/19/16 0700   PRN Meds:.sodium chloride, labetalol, metoCLOPramide (REGLAN) injection, metoprolol, morphine injection, ondansetron (ZOFRAN) IV, oxyCODONE, sodium chloride flush, sodium chloride flush, sodium chloride flush, traMADol  General appearance: alert, cooperative and no distress Heart: regular rate and rhythm Lungs: diminished breath sounds bibasilar Abdomen: soft, non-tender; bowel sounds normal; no masses,  no organomegaly Extremities: edema tracec Wound: clean and dry  Lab Results: CBC: Recent Labs  09/18/16 1706 09/18/16 1711 09/19/16 0355  WBC 9.7  --  8.0  HGB 11.7* 11.2* 10.6*  HCT 33.9* 33.0* 31.7*  PLT 99*  --  99*   BMET:  Recent Labs  09/18/16 0358  09/18/16 1711 09/19/16 0355  NA 137  --  138 135  K 3.6  --  4.6 4.4  CL 109  --  103 103  CO2 22  --   --  24  GLUCOSE 154*  --  158* 134*  BUN 12  --  17 21*  CREATININE 0.75  < > 1.00 1.39*  CALCIUM 7.7*  --   --  8.0*  < > = values in this interval not displayed.  CMET: Lab Results  Component Value Date   WBC 8.0 09/19/2016   HGB 10.6 (L) 09/19/2016   HCT 31.7 (L) 09/19/2016   PLT 99 (L) 09/19/2016   GLUCOSE 134 (H) 09/19/2016   ALT 19 09/15/2016   AST 20 09/15/2016   NA 135 09/19/2016   K 4.4 09/19/2016   CL 103 09/19/2016   CREATININE 1.39 (H) 09/19/2016   BUN 21 (H) 09/19/2016   CO2 24 09/19/2016   TSH 1.63 08/07/2016   INR 1.45 09/17/2016   HGBA1C 5.0 09/15/2016      PT/INR:  Recent Labs  09/17/16 1451  LABPROT 17.7*  INR 1.45     Radiology: No results found.   Assessment/Plan: S/P Procedure(s) (LRB): MINIMALLY INVASIVE AORTIC VALVE REPLACEMENT (AVR) THROUGH PARTIAL STERNOTOMY USING A 21MM EDWARDS INTUITY ELITE VALVE (N/A) TRANSESOPHAGEAL ECHOCARDIOGRAM (TEE) (N/A)  1. CV- NSR, BP labile- continue [place], will d/c Cozaar as BP was too low last night, her creatinine also bumped... Can restart later if BP becomes elevated 2. Pulm- CT with 300 cc output- will d/c today, wean oxygen as tolerated, CXR with bibasilar atelectasis continue IS 3. Renal- creatinine bumped to 1.38, remains hypervolemic... Will give IV Lasix at 40 mg today,transition to oral tomorrow, K at 4.0, will start oral supplementation 4. GI- Vomiting x 4 yesterday, denies N/V for today.... Minor bowel distention,  + BS... Continue reglan and monitor progress diet as tolerated as long as no further vomiting occurs 5. ID- low grade temp today, no leukocytosis, likely Atelectasis... Wounds are healing nicely no signs of infection 6. Expected post operative blood loss anemia, mild Hgb at 10.6 7. CBGs controlled, not a diabetic will d/c SSIP 8. Deconditioning- lives alone, will need SNF.. CSW, PT/OT consults are placed... Planning for Monday d/c if patient continues to progress 9. Dispo- patient stable, maintaining NSR, d/c Cozaar for now with elevated creatinine, low BP..... Continue diuresis, d/c chest tubes, foley, central line... Transfer to [place] 09/19/2016 7:51 AM   I have seen and examined the patient and agree with the assessment and plan as outlined.  Doing very well POD2.  Mobilize.  Diuresis.  Stop Cozaar and resume if BP will allow once renal function stable at baseline.  Transfer step down.  Rexene Alberts, MD 09/19/2016 8:25 AM

## 2016-09-19 NOTE — Progress Notes (Signed)
CT surgery PM Rounds  Patient examined and record reviewed.Hemodynamics stable,labs satisfactory.Patient had stable day.Continue current care. Tharon Aquas Trigt III 09/19/2016

## 2016-09-19 NOTE — Progress Notes (Signed)
Patient has been accepted at [facility] on Monday if ready to dc.  Jasmin Allen LCSWA [phone]

## 2016-09-19 NOTE — Discharge Summary (Signed)
Physician Discharge Summary  Patient ID: Jasmin Allen MRN: [id number] DOB/AGE: 75-Feb-1942 75 y.o.  Admit date: 09/17/2016 Discharge date: 09/22/2016  Admission Diagnoses: Patient Active Problem List   Diagnosis Date Noted  . Essential hypertension   . Hyperlipidemia   . Chronic diastolic congestive heart failure ([place])   . Severe aortic stenosis 08/14/2016  . Anemia 03/02/2016  . Lymphadenopathy of left cervical region 04/07/2012    Discharge Diagnoses:   Patient Active Problem List   Diagnosis Date Noted  . S/P aortic valve replacement with bioprosthetic valve 09/17/2016  . S/P AVR (aortic valve replacement) 09/17/2016  . Essential hypertension   . Hyperlipidemia   . Chronic diastolic congestive heart failure ([place])   . Severe aortic stenosis 08/14/2016  . Anemia 03/02/2016  . Lymphadenopathy of left cervical region 04/07/2012   Discharged Condition: good  HPI:    Patient is a 76 year old female with history of bicuspid aortic valve and aortic stenosis referred for elective surgical consultation. The patient states that she was first noted to have a heart murmur on routine physical exam nearly 20 years ago. She has been followed by her primary care physician with echocardiograms which have demonstrated gradual progression of aortic stenosis. Transthoracic echocardiogram performed in 2016 reportedly demonstrated severe aortic stenosis. At the time the patient was relatively asymptomatic and weight loss was recommended. Recently the patient was seen in follow-up by her primary care physician and noted to report mild symptoms of exertional shortness of breath with occasional dizzy spells. Follow-up echocardiogram performed at [facility] revealed severe aortic stenosis with normal left ventricular systolic function. Peak velocity across the aortic valve was reported 4.2 m/s corresponding to mean transvalvular gradient 43 mmHg. Findings were reportedly similar to the  echocardiogram performed in 2016. She was seen in consultation by Dr. Stanford Breed and subsequently scheduled for diagnostic cardiac catheterization. Catheterization performed by Dr. Burt Knack revealed normal coronary arteries with no significant coronary artery disease. The patient has subsequently been referred for elective surgical consultation.  The patient is widowed and lives in a patio home community in [place]. 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 She has remained reasonably active and functionally completely independent, although she has been obese for most of her adult life. She drives an automobile and tends to [employer] personal needs. She states that she has noted mild symptoms of exertional shortness of breath and fatigue that occur with more strenuous exertion, such as walking at a brisk pace. The cold weather has seemed to make symptoms a little worse. She has also recently developed some intermittent dizzy spells without any history of syncope. She denies any chest pain or chest tightness and she has not had PND, orthopnea, or palpitations. She has chronic lower extremity edema that is unchanged.  Patient returns to the office today for follow-up of severe symptomatic aortic stenosis with tentative plans to proceed with aortic valve replacement later this week. She was originally seen in consultation on 08/19/2016. Since then she reports no new problems or complaints. She reports stable symptoms of mild exertional shortness of breath that occur with moderate level activity. She has not had resting shortness breath, PND, orthopnea, or lower extremity edema. She has never had any chest pain or chest tightness. She is not having dizzy spells. She denies any fevers chills or productive cough. She is looking forward to getting the surgery put behind her. She has looked into possible facilities for her to go following hospital  discharge for rehabilitation and picked  out a place in [place].    Hospital Course:   On 09/17/2016 Jasmin Allen underwent a minimally invasive aortic valve replacement by Dr. Darylene Price. She tolerated the procedure well and was transferred to the ICU. She was extubated in a timely manner. Postop day 1 she maintained normal sinus rhythm with stable hemodynamics on no drips. She remained on 4 L nasal cannula for oxygen support. She had some expected postoperative atelectasis on chest x-ray. We initiated diuretic regimen for fluid overload. We were able to discontinue her arterial line and Swan-Ganz catheter. We've restarted her beta blocker. Postop day 2 she continued to progress. We were able to discontinue her chest tubes. We gave an extra dose of IV Lasix due to fluid overload. She did have some nausea and vomiting which we treated with Reglan and Zofran.  She had mild elevation of her creatinine with peak at 1.41. We consulted case management and physical therapy and occupational therapy for possible rehabilitation facility needed on discharge.  She was stable to be transferred to the stepdown unit for continued care  The patient continues to make progress.  She continues to maintain NSR.  Her pacing wires were removed without difficulty.  She is hypertensive and was restarted on her home Cozaar at a reduced dose.  This can be titrated as needed for further hypertension.  She will continue diuretics for mild hypervolemia.  Her creatinine has normalized at 1.05.  Her right groin wound has mild dehiscence.  There is no infection present, however there will need to be daily diligent wound care performed.  It is very important this area stay dry to prevent further skin breakdown.  She is ambulating with PT/OT.  She has not suffered any further nausea or vomiting and his tolerating a heart healthy diet.  She is felt medically stable for discharge to SNF today.   Consults: None  Significant Diagnostic Studies: CLINICAL DATA:   76 year old female status post CABG with right-sided chest tube  EXAM: PORTABLE CHEST 1 VIEW  COMPARISON:  Prior chest x-ray 09/18/2016  FINDINGS: Interval removal of Swan-Ganz catheter. A right IJ approach Cordis sheath remains in place with the tip in the mid SVC. Unchanged position of right basilar chest tube. No evidence of pneumothorax. Persistent very low inspiratory volumes with bibasilar atelectasis. There is likely a small left pleural effusion. No pulmonary edema. Surgical changes of median sternotomy and aortic valve replacement.  IMPRESSION: 1. Persistent very low inspiratory volumes with bibasilar atelectasis and likely a small left pleural effusion. No new or acute cardiopulmonary process. 2. Interval removal of left IJ approach Swan-Ganz catheter. 3. Stable position of right-sided chest tube and right IJ vascular sheath.   Electronically Signed   By: Jacqulynn Cadet M.D.   On: 09/19/2016 08:06   Treatments: CARDIOTHORACIC SURGERY OPERATIVE NOTE  Date of Procedure:                09/17/2016  Preoperative Diagnosis:      Severe Aortic Stenosis   Postoperative Diagnosis:    Same   Procedure:        Minimally Invasive Aortic Valve Replacement             Partial Upper Mini-sternotomy             Edwards Intuity Elite rapid deployment bovine pericardial tissue valve (size 21 mm, model # 8300AB, serial # [id number])  Surgeon:        Valentina Gu. Roxy Manns, MD  Assistant:       Ellwood Handler, PA-C  Anesthesia:    Suzette Battiest, MD  Operative Findings: ? Bicuspid aortic valve ? Severe aortic stenosis ? Normal LV systolic function ? Mild to moderate LV hypertrophy  Disposition: SNF   Allergies as of 09/22/2016      Reactions   Penicillins Swelling   Has patient had a PCN reaction causing immediate rash, facial/tongue/throat swelling, SOB or lightheadedness with hypotension: Unknown Has patient had a PCN reaction causing  severe rash involving mucus membranes or skin necrosis: Unknown Has patient had a PCN reaction that required hospitalization No Has patient had a PCN reaction occurring within the last 10 years: No If all of the above answers are "NO", then may proceed with Cephalosporin use.   Sulfa Antibiotics Hives      Medication List    STOP taking these medications   hydrochlorothiazide 25 MG tablet Commonly known as:  HYDRODIURIL     TAKE these medications   acetaminophen 650 MG CR tablet Commonly known as:  TYLENOL Take 650 mg by mouth every 8 (eight) hours as needed for pain. Has stopped prior to procedure   aspirin 325 MG EC tablet Take 1 tablet (325 mg total) by mouth daily.   B-complex with vitamin C tablet Take 1 tablet by mouth every evening.   CALCIUM 600+D3 600-400 MG-UNIT Tabs Generic drug:  Calcium Carbonate-Vitamin D3 Take 1 tablet by mouth at bedtime.   carvedilol 6.25 MG tablet Commonly known as:  COREG Take 6.25 mg by mouth 2 (two) times daily with a meal.   Co Q-10 300 MG Caps Take 300 mg by mouth at bedtime.   fluticasone 50 MCG/ACT nasal spray Commonly known as:  FLONASE INSTILL 2 SPRAYS IN EACH NOSTRIL EVERY DAY What changed:  See the new instructions.   folic acid 462 MCG tablet Commonly known as:  FOLVITE Take 400 mcg by mouth at bedtime.   furosemide 40 MG tablet Commonly known as:  LASIX Take 1 tablet (40 mg total) by mouth daily.   loratadine 10 MG tablet Commonly known as:  CLARITIN Take 10 mg by mouth daily as needed for allergies.   losartan 25 MG tablet Commonly known as:  COZAAR Take 1 tablet (25 mg total) by mouth at bedtime. What changed:  medication strength  how much to take   MUSCLE RUB EX Apply 1 application topically daily as needed (muscle pain).   potassium chloride SA 20 MEQ tablet Commonly known as:  K-DUR,KLOR-CON Take 2 tablets (40 mEq total) by mouth daily.   pravastatin 20 MG tablet Commonly known as:   PRAVACHOL Take 20 mg by mouth at bedtime.   traMADol 50 MG tablet Commonly known as:  ULTRAM Take 1-2 tablets (50-100 mg total) by mouth every 4 (four) hours as needed for moderate pain.   vitamin B-12 1000 MCG tablet Commonly known as:  CYANOCOBALAMIN Take 1,000 mcg by mouth every evening.   Vitamin D 2000 units tablet Take 2,000 Units by mouth at bedtime.     The patient has been discharged on:   1.Beta Blocker:  Yes [ x  ]                              No   [   ]  If No, reason:  2.Ace Inhibitor/ARB: Yes [ x  ]                                     No  [    ]                                     If No, reason:  3.Statin:   Yes [ x  ]                  No  [   ]                  If No, reason:  4.Ecasa:  Yes  [ x  ]                  No   [   ]                  If No, reason:   Follow-up Information    Rexene Alberts, MD Follow up.   Specialty:  Cardiothoracic Surgery Why:  Your appointment is on May 14th at 2:30pm. Please arrive at 2:00pm for a chest xray located at [facility] located on the first floor of our building.  Contact information: [address] [place] [address] [phone]        Lonia Mad, MD. Call in 1 day(s).   Specialty:  Internal Medicine Why:  Please arrange a follow-up in 4 weeks.       Erma Heritage, PA. Go on 10/06/2016.   Specialties:  Physician Assistant, Cardiology Why:  @ 11 am. please arrive at least 10 minutes early Contact information: [address] [place] [address] [phone]           Signed: Ellwood Handler 09/22/2016, 7:51 AM

## 2016-09-19 NOTE — Evaluation (Signed)
Physical Therapy Evaluation Patient Details Name: Jasmin Allen MRN: [id number] DOB: July 31, 1940 Today's Date: 09/19/2016   History of Present Illness  Patient is a 76 y/o female admitted due to severe aortic stenosis s/p minimally invasive AVR and intraoperative TEE.  PMH positive for AS, bicuspid aortic valve, HTN, HLD, arthritis and obesity.  Clinical Impression  Patient presents s/p AVR with decreased independence with mobility.  She will benefit from skilled PT in the acute setting prior to d/c to SNF level rehab as pt lives alone.  She was previously independent for mobility, current needing up to mod A for limited ambulation with RW.      Follow Up Recommendations SNF    Equipment Recommendations  None recommended by PT    Recommendations for Other Services       Precautions / Restrictions Precautions Precautions: Sternal;Fall      Mobility  Bed Mobility               General bed mobility comments: NT, pt in chair  Transfers Overall transfer level: Needs assistance Equipment used: Rolling walker (2 wheeled) Transfers: Sit to/from Stand Sit to Stand: Mod assist         General transfer comment: cues for sternal precautions, lifting help needed  Ambulation/Gait Ambulation/Gait assistance: Min assist Ambulation Distance (Feet): 40 Feet Assistive device: Rolling walker (2 wheeled) Gait Pattern/deviations: Step-through pattern;Decreased stride length;Shuffle     General Gait Details: already walked with nursing less than hour prior, limited to door and back to chair due to fatigue; assist for balance, safety with RW  Stairs            Wheelchair Mobility    Modified Rankin (Stroke Patients Only)       Balance Overall balance assessment: Needs assistance Sitting-balance support: No upper extremity supported Sitting balance-Leahy Scale: Good     Standing balance support: Bilateral upper extremity supported Standing balance-Leahy Scale:  Poor Standing balance comment: UE support for balance                             Pertinent Vitals/Pain Pain Assessment: Faces Faces Pain Scale: Hurts little more Pain Location: chest with movement Pain Descriptors / Indicators: Discomfort;Sore Pain Intervention(s): Monitored during session;Repositioned;Other (comment) (encouraged use of pillow during mobility)    Home Living Family/patient expects to be discharged to:: Skilled nursing facility Living Arrangements: Alone Available Help at Discharge: Family;Available PRN/intermittently Type of Home: House Home Access: Level entry     Home Layout: One level Home Equipment: Walker - 4 wheels;Shower seat - built in;Grab bars - tub/shower      Prior Function Level of Independence: Independent         Comments: drives, cooks, Chief Executive Officer        Extremity/Trunk Assessment   Upper Extremity Assessment Upper Extremity Assessment: Generalized weakness (able to lift to 90 degrees, limited due to precautions)    Lower Extremity Assessment Lower Extremity Assessment: RLE deficits/detail;LLE deficits/detail RLE Deficits / Details: strength hip flexion 3-/5, knee extension 4-/5, ankle DF 4+/5 LLE Deficits / Details: strength hip flexion 3-/5, knee extension 4-/5, ankle DF 4+/5       Communication   Communication: No difficulties  Cognition Arousal/Alertness: Awake/alert Behavior During Therapy: WFL for tasks assessed/performed Overall Cognitive Status: Within Functional Limits for tasks assessed  General Comments General comments (skin integrity, edema, etc.): family in room (?dtr-in-law) reports pt has hard time asking for help; pt reports equipment from her spouse, states has very high bed and some difficutly getting in prior to surgery    Exercises     Assessment/Plan    PT Assessment Patient needs continued PT services  PT Problem  List Decreased strength;Decreased mobility;Decreased knowledge of precautions;Decreased activity tolerance;Decreased balance;Decreased knowledge of use of DME;Pain       PT Treatment Interventions DME instruction;Gait training;Therapeutic exercise;Patient/family education;Therapeutic activities;Balance training;Functional mobility training    PT Goals (Current goals can be found in the Care Plan section)  Acute Rehab PT Goals Patient Stated Goal: To go to rehab prior to returning home PT Goal Formulation: With patient/family Time For Goal Achievement: 09/26/16 Potential to Achieve Goals: Good    Frequency Min 3X/week   Barriers to discharge        Co-evaluation               End of Session   Activity Tolerance: Patient limited by fatigue Patient left: in chair;with call bell/phone within reach;with family/visitor present   PT Visit Diagnosis: Difficulty in walking, not elsewhere classified (R26.2);Muscle weakness (generalized) (M62.81)    Time: 1510-1535 PT Time Calculation (min) (ACUTE ONLY): 25 min   Charges:   PT Evaluation $PT Eval Moderate Complexity: 1 Procedure PT Treatments $Gait Training: 8-22 mins   PT G CodesMagda Kiel, [place] [phone] 09/19/2016   Reginia Naas 09/19/2016, 4:04 PM

## 2016-09-19 NOTE — Progress Notes (Signed)
Attempted report to 2W nurse. Left call-back number w/secretary.

## 2016-09-20 ENCOUNTER — Inpatient Hospital Stay (HOSPITAL_COMMUNITY): Payer: Medicare Other

## 2016-09-20 LAB — BASIC METABOLIC PANEL
Anion gap: 8 (ref 5–15)
BUN: 29 mg/dL — ABNORMAL HIGH (ref 6–20)
CALCIUM: 8.6 mg/dL — AB (ref 8.9–10.3)
CO2: 26 mmol/L (ref 22–32)
Chloride: 101 mmol/L (ref 101–111)
Creatinine, Ser: 1.41 mg/dL — ABNORMAL HIGH (ref 0.44–1.00)
GFR calc Af Amer: 41 mL/min — ABNORMAL LOW (ref >60)
GFR, EST NON AFRICAN AMERICAN: 36 mL/min — AB (ref >60)
Glucose, Bld: 125 mg/dL — ABNORMAL HIGH (ref 65–99)
POTASSIUM: 4.8 mmol/L (ref 3.5–5.1)
Sodium: 135 mmol/L (ref 135–145)

## 2016-09-20 LAB — CBC
HCT: 34.3 % — ABNORMAL LOW (ref 36.0–46.0)
HEMOGLOBIN: 11.6 g/dL — AB (ref 12.0–15.0)
MCH: 32.8 pg (ref 26.0–34.0)
MCHC: 33.8 g/dL (ref 30.0–36.0)
MCV: 96.9 fL (ref 78.0–100.0)
Platelets: 133 10*3/uL — ABNORMAL LOW (ref 150–400)
RBC: 3.54 MIL/uL — AB (ref 3.87–5.11)
RDW: 15.1 % (ref 11.5–15.5)
WBC: 8.6 10*3/uL (ref 4.0–10.5)

## 2016-09-20 MED ORDER — METOLAZONE 5 MG PO TABS
5.0000 mg | ORAL_TABLET | Freq: Once | ORAL | Status: AC
  Administered 2016-09-20: 5 mg via ORAL
  Filled 2016-09-20: qty 1

## 2016-09-20 NOTE — Progress Notes (Addendum)
[address]       [place] [address]             [phone]      3 Days Post-Op Procedure(s) (LRB): MINIMALLY INVASIVE AORTIC VALVE REPLACEMENT (AVR) THROUGH PARTIAL STERNOTOMY USING A 21MM EDWARDS INTUITY ELITE VALVE (N/A) TRANSESOPHAGEAL ECHOCARDIOGRAM (TEE) (N/A) Subjective:  feels well, no vomiting  Objective: Vital signs in last 24 hours: Temp:  [97.9 F (36.6 C)-98.8 F (37.1 C)] 98.4 F (36.9 C) (04/21 0536) Pulse Rate:  [82-109] 109 (04/21 0536) Cardiac Rhythm: Normal sinus rhythm (04/20 2156) Resp:  [13-27] 20 (04/21 0536) BP: (92-141)/(50-65) 141/53 (04/21 0536) SpO2:  [89 %-100 %] 93 % (04/21 0536) Weight:  [211 lb 6.7 oz (95.9 kg)] 211 lb 6.7 oz (95.9 kg) (04/21 0536)  Hemodynamic parameters for last 24 hours:    Intake/Output from previous day: 04/20 0701 - 04/21 0700 In: 740 [P.O.:720; I.V.:20] Out: 960 [Urine:950; Chest Tube:10] Intake/Output this shift: No intake/output data recorded.  General appearance: alert, cooperative and no distress Heart: regular rate and rhythm and soft systolic murmur Lungs: clear to auscultation bilaterally Abdomen: benign Extremities: minor edema Wound: incis healing well  Lab Results:  Recent Labs  09/19/16 0355 09/20/16 0411  WBC 8.0 8.6  HGB 10.6* 11.6*  HCT 31.7* 34.3*  PLT 99* 133*   BMET:  Recent Labs  09/19/16 0355 09/20/16 0411  NA 135 135  K 4.4 4.8  CL 103 101  CO2 24 26  GLUCOSE 134* 125*  BUN 21* 29*  CREATININE 1.39* 1.41*  CALCIUM 8.0* 8.6*    PT/INR:  Recent Labs  09/17/16 1451  LABPROT 17.7*  INR 1.45   ABG    Component Value Date/Time   PHART 7.304 (L) 09/17/2016 2204   HCO3 19.4 (L) 09/17/2016 2204   TCO2 23 09/18/2016 1711   ACIDBASEDEF 6.0 (H) 09/17/2016 2204   O2SAT 96.0 09/17/2016 2204   CBG (last 3)   Recent Labs  09/19/16 0349 09/19/16 0830 09/19/16 1146  GLUCAP 117* 145* 130*    Meds Scheduled Meds: . acetaminophen  1,000 mg  Oral Q6H  . aspirin EC  325 mg Oral Daily  . bisacodyl  10 mg Oral Daily   Or  . bisacodyl  10 mg Rectal Daily  . carvedilol  6.25 mg Oral BID WC  . Chlorhexidine Gluconate Cloth  6 each Topical Daily  . docusate sodium  200 mg Oral Daily  . enoxaparin (LOVENOX) injection  30 mg Subcutaneous Q24H  . folic acid  0.5 mg Oral QHS  . furosemide  40 mg Oral Daily  . mouth rinse  15 mL Mouth Rinse BID  . moving right along book   Does not apply Once  . pantoprazole  40 mg Oral Daily  . potassium chloride  40 mEq Oral Daily  . pravastatin  20 mg Oral QHS  . sodium chloride flush  10-40 mL Intracatheter Q12H  . sodium chloride flush  3 mL Intravenous Q12H  . sodium chloride flush  3 mL Intravenous Q12H  . vitamin B-12  1,000 mcg Oral QPM   Continuous Infusions: . sodium chloride    . sodium chloride     PRN Meds:.sodium chloride, albuterol, labetalol, metoCLOPramide (REGLAN) injection, metoprolol, morphine injection, ondansetron (ZOFRAN) IV, oxyCODONE, sodium chloride flush, sodium chloride flush, sodium chloride flush, traMADol  Xrays Dg Chest Port 1 View  Result Date: 09/19/2016 CLINICAL DATA:  76 year old female status post CABG with  right-sided chest tube EXAM: PORTABLE CHEST 1 VIEW COMPARISON:  Prior chest x-ray 09/18/2016 FINDINGS: Interval removal of Swan-Ganz catheter. A right IJ approach Cordis sheath remains in place with the tip in the mid SVC. Unchanged position of right basilar chest tube. No evidence of pneumothorax. Persistent very low inspiratory volumes with bibasilar atelectasis. There is likely a small left pleural effusion. No pulmonary edema. Surgical changes of median sternotomy and aortic valve replacement. IMPRESSION: 1. Persistent very low inspiratory volumes with bibasilar atelectasis and likely a small left pleural effusion. No new or acute cardiopulmonary process. 2. Interval removal of left IJ approach Swan-Ganz catheter. 3. Stable position of right-sided chest tube  and right IJ vascular sheath. Electronically Signed   By: Jacqulynn Cadet M.D.   On: 09/19/2016 08:06    Assessment/Plan: S/P Procedure(s) (LRB): MINIMALLY INVASIVE AORTIC VALVE REPLACEMENT (AVR) THROUGH PARTIAL STERNOTOMY USING A 21MM EDWARDS INTUITY ELITE VALVE (N/A) TRANSESOPHAGEAL ECHOCARDIOGRAM (TEE) (N/A)  1 doing well 2 cont to wean O2- pulm toilet, CXR some efusion/atx bilat 3 cont to diurese for volume overload, hemodyn stable in sinus, BP is variable 90's-140's 4 sugars adeq controlled- A1C 5.0 5 creat is stable 6 H/H stable 7 routine rehab 8 SNF at discharge  LOS: 3 days    GOLD,WAYNE E 09/20/2016  CXR today personally reviewed- needs more IS, diuresis   patient examined and medical record reviewed,agree with above note. Tharon Aquas Trigt III 09/20/2016

## 2016-09-20 NOTE — Progress Notes (Signed)
CARDIAC REHAB PHASE I   PRE:  Rate/Rhythm: 95 SR  BP:  Supine:   Sitting: 115/52  Standing:    SaO2: 95% 2L  92%RA  MODE:  Ambulation: 300 ft   POST:  Rate/Rhythm: 106 ST  BP:  Supine:   Sitting: 115/59  Standing:    SaO2: 86%RA   95% 2L 1225-8346 Tried pt on RA. Walked 300 ft on RA with rolling walker and asst x 1 with steady gait. Tolerated well except sats dropped to 86%. Put back on 2L and encouraged IS. Getting about 500 ml. Encouraged more walks and IS. To recliner with call bell.   Graylon Good, RN BSN  09/20/2016 9:20 AM

## 2016-09-21 LAB — TYPE AND SCREEN
ABO/RH(D): O POS
Antibody Screen: NEGATIVE
UNIT DIVISION: 0
UNIT DIVISION: 0
Unit division: 0
Unit division: 0
Unit division: 0
Unit division: 0

## 2016-09-21 LAB — CBC
HCT: 29.5 % — ABNORMAL LOW (ref 36.0–46.0)
Hemoglobin: 9.7 g/dL — ABNORMAL LOW (ref 12.0–15.0)
MCH: 31.5 pg (ref 26.0–34.0)
MCHC: 32.9 g/dL (ref 30.0–36.0)
MCV: 95.8 fL (ref 78.0–100.0)
Platelets: 133 10*3/uL — ABNORMAL LOW (ref 150–400)
RBC: 3.08 MIL/uL — ABNORMAL LOW (ref 3.87–5.11)
RDW: 14.5 % (ref 11.5–15.5)
WBC: 6 10*3/uL (ref 4.0–10.5)

## 2016-09-21 LAB — BASIC METABOLIC PANEL
Anion gap: 8 (ref 5–15)
BUN: 30 mg/dL — ABNORMAL HIGH (ref 6–20)
CO2: 26 mmol/L (ref 22–32)
Calcium: 8.3 mg/dL — ABNORMAL LOW (ref 8.9–10.3)
Chloride: 100 mmol/L — ABNORMAL LOW (ref 101–111)
Creatinine, Ser: 1.05 mg/dL — ABNORMAL HIGH (ref 0.44–1.00)
GFR calc Af Amer: 59 mL/min — ABNORMAL LOW (ref >60)
GFR calc non Af Amer: 51 mL/min — ABNORMAL LOW (ref >60)
Glucose, Bld: 129 mg/dL — ABNORMAL HIGH (ref 65–99)
Potassium: 4.3 mmol/L (ref 3.5–5.1)
Sodium: 134 mmol/L — ABNORMAL LOW (ref 135–145)

## 2016-09-21 LAB — BPAM RBC
BLOOD PRODUCT EXPIRATION DATE: 201805112359
BLOOD PRODUCT EXPIRATION DATE: 201805152359
BLOOD PRODUCT EXPIRATION DATE: 201805152359
Blood Product Expiration Date: 201805112359
Blood Product Expiration Date: 201805112359
Blood Product Expiration Date: 201805152359
ISSUE DATE / TIME: 201804180953
ISSUE DATE / TIME: 201804181028
ISSUE DATE / TIME: 201804180953
ISSUE DATE / TIME: 201804181028
UNIT TYPE AND RH: 5100
UNIT TYPE AND RH: 5100
UNIT TYPE AND RH: 5100
UNIT TYPE AND RH: 5100
Unit Type and Rh: 5100
Unit Type and Rh: 5100

## 2016-09-21 NOTE — Progress Notes (Addendum)
[address]       [place] [address]             [phone]      4 Days Post-Op Procedure(s) (LRB): MINIMALLY INVASIVE AORTIC VALVE REPLACEMENT (AVR) THROUGH PARTIAL STERNOTOMY USING A 21MM EDWARDS INTUITY ELITE VALVE (N/A) TRANSESOPHAGEAL ECHOCARDIOGRAM (TEE) (N/A) Subjective: conts to feel stronger  Objective: Vital signs in last 24 hours: Temp:  [97.8 F (36.6 C)-98.3 F (36.8 C)] 97.8 F (36.6 C) (04/22 0344) Pulse Rate:  [84-85] 85 (04/22 0344) Cardiac Rhythm: Normal sinus rhythm (04/21 2001) Resp:  [18] 18 (04/22 0344) BP: (102-121)/(47-53) 121/53 (04/22 0344) SpO2:  [97 %-100 %] 100 % (04/22 0344) Weight:  [212 lb 11.2 oz (96.5 kg)] 212 lb 11.2 oz (96.5 kg) (04/22 0344)  Hemodynamic parameters for last 24 hours:    Intake/Output from previous day: 04/21 0701 - 04/22 0700 In: 600 [P.O.:600] Out: 2000 [Urine:2000] Intake/Output this shift: No intake/output data recorded.  General appearance: alert, cooperative and no distress Heart: regular rate and rhythm Lungs: mildly dim in bases Abdomen: benign Extremities: minor edema Wound: incis healing well  Lab Results:  Recent Labs  09/20/16 0411 09/21/16 0400  WBC 8.6 6.0  HGB 11.6* 9.7*  HCT 34.3* 29.5*  PLT 133* 133*   BMET:  Recent Labs  09/20/16 0411 09/21/16 0400  NA 135 134*  K 4.8 4.3  CL 101 100*  CO2 26 26  GLUCOSE 125* 129*  BUN 29* 30*  CREATININE 1.41* 1.05*  CALCIUM 8.6* 8.3*    PT/INR: No results for input(s): LABPROT, INR in the last 72 hours. ABG    Component Value Date/Time   PHART 7.304 (L) 09/17/2016 2204   HCO3 19.4 (L) 09/17/2016 2204   TCO2 23 09/18/2016 1711   ACIDBASEDEF 6.0 (H) 09/17/2016 2204   O2SAT 96.0 09/17/2016 2204   CBG (last 3)   Recent Labs  09/19/16 0349 09/19/16 0830 09/19/16 1146  GLUCAP 117* 145* 130*    Meds Scheduled Meds: . acetaminophen  1,000 mg Oral Q6H  . aspirin EC  325 mg Oral Daily  . bisacodyl  10 mg  Oral Daily   Or  . bisacodyl  10 mg Rectal Daily  . carvedilol  6.25 mg Oral BID WC  . docusate sodium  200 mg Oral Daily  . enoxaparin (LOVENOX) injection  30 mg Subcutaneous Q24H  . folic acid  0.5 mg Oral QHS  . furosemide  40 mg Oral Daily  . mouth rinse  15 mL Mouth Rinse BID  . moving right along book   Does not apply Once  . pantoprazole  40 mg Oral Daily  . potassium chloride  40 mEq Oral Daily  . pravastatin  20 mg Oral QHS  . sodium chloride flush  10-40 mL Intracatheter Q12H  . sodium chloride flush  3 mL Intravenous Q12H  . vitamin B-12  1,000 mcg Oral QPM   Continuous Infusions: . sodium chloride    . sodium chloride     PRN Meds:.sodium chloride, albuterol, labetalol, metoCLOPramide (REGLAN) injection, metoprolol, morphine injection, ondansetron (ZOFRAN) IV, oxyCODONE, sodium chloride flush, traMADol  Xrays Dg Chest 2 View  Result Date: 09/20/2016 CLINICAL DATA:  Removal. EXAM: CHEST  2 VIEW COMPARISON:  09/19/2016 FINDINGS: Right jugular sheath and right chest tube have been removed. Sequelae of aortic valve replacement are again identified. The cardiomediastinal silhouette is unchanged. Lung volumes remain diminished with unchanged bibasilar opacities suggesting atelectasis. Small  left and possibly trace right pleural effusions are noted. No pneumothorax is identified. IMPRESSION: 1. No pneumothorax following chest tube removal. 2. Persistent low lung volumes with bibasilar atelectasis and small pleural effusions. Electronically Signed   By: Logan Bores M.D.   On: 09/20/2016 08:02    Assessment/Plan: S/P Procedure(s) (LRB): MINIMALLY INVASIVE AORTIC VALVE REPLACEMENT (AVR) THROUGH PARTIAL STERNOTOMY USING A 21MM EDWARDS INTUITY ELITE VALVE (N/A) TRANSESOPHAGEAL ECHOCARDIOGRAM (TEE) (N/A)  1 doing well 2 cont pulm toilet/rehab, wean O2 off 3 hemodyn stable in sinus rhythm 4 d/c epw's today 5 creat decreased- now in normal range, cont gentle diuresis 6 Hct  dropped 34.3 to 29.5- will repeat in am 7 poss tx to SNF in am   LOS: 4 days    GOLD,WAYNE E 09/21/2016 Continues to do well Epicardial pacing wires removed today Should be ready to transfer to skilled nursing facility for rehabilitation therapies tomorrow patient examined and medical record reviewed,agree with above note. Tharon Aquas Trigt III 09/21/2016

## 2016-09-21 NOTE — Evaluation (Signed)
Occupational Therapy Evaluation Patient Details Name: Jasmin Allen MRN: [id number] DOB: 05-25-1941 Today's Date: 09/21/2016    History of Present Illness Patient is a 76 y/o female admitted due to severe aortic stenosis s/p minimally invasive AVR and intraoperative TEE.  PMH positive for AS, bicuspid aortic valve, HTN, HLD, arthritis and obesity.   Clinical Impression   Pt reports she was independent with ADL PTA. Currently pt overall min guard for functional mobility; min-mod assist for sit to stand, and min assist for ADL. Pt able to recall 2/5 sternal precautions; reviewed all precautions with pt. Recommending SNF for follow up to maximize independence and safety with ADL and functional mobility prior to return home alone. Pt would benefit from continued skilled OT to address established goals.    Follow Up Recommendations  SNF    Equipment Recommendations  None recommended by OT    Recommendations for Other Services       Precautions / Restrictions Precautions Precautions: Sternal;Fall Precaution Comments: Pt able to recall 2/5 sternal precautions. Reviewed all precautions with pt. Restrictions Weight Bearing Restrictions: Yes Other Position/Activity Restrictions: sternal precautions      Mobility Bed Mobility               General bed mobility comments: Pt OOB in chair upon arrival.  Transfers Overall transfer level: Needs assistance Equipment used: None Transfers: Sit to/from Stand Sit to Stand: Mod assist;Min assist         General transfer comment: Mod assist from chair, min assist from [place] Overall balance assessment: Needs assistance Sitting-balance support: Feet supported;No upper extremity supported Sitting balance-Leahy Scale: Good     Standing balance support: No upper extremity supported;During functional activity Standing balance-Leahy Scale: Fair                             ADL either performed or assessed with  clinical judgement   ADL Overall ADL's : Needs assistance/impaired Eating/Feeding: Set up;Sitting   Grooming: Min guard;Standing;Wash/dry hands   Upper Body Bathing: Minimal assistance;Sitting   Lower Body Bathing: Minimal assistance;Sit to/from stand   Upper Body Dressing : Minimal assistance;Sitting   Lower Body Dressing: Minimal assistance;Sit to/from stand   Toilet Transfer: Minimal assistance;Ambulation;BSC Toilet Transfer Details (indicate cue type and reason): Min assist for sit to stand, min guard for ambulation Toileting- Clothing Manipulation and Hygiene: Min guard;Sit to/from stand       Functional mobility during ADLs: Min guard       Vision         Perception     Praxis      Pertinent Vitals/Pain Pain Assessment: No/denies pain     Hand Dominance     Extremity/Trunk Assessment Upper Extremity Assessment Upper Extremity Assessment: Overall WFL for tasks assessed (limited due to sternal precautions)   Lower Extremity Assessment Lower Extremity Assessment: Defer to PT evaluation   Cervical / Trunk Assessment Cervical / Trunk Assessment: Normal   Communication Communication Communication: No difficulties   Cognition Arousal/Alertness: Awake/alert Behavior During Therapy: WFL for tasks assessed/performed Overall Cognitive Status: Within Functional Limits for tasks assessed                                     General Comments       Exercises     Shoulder Instructions      Home  Living Family/patient expects to be discharged to:: Skilled nursing facility Living Arrangements: Alone Available Help at Discharge: Family;Available PRN/intermittently Type of Home: House Home Access: Level entry     Home Layout: One level     Bathroom Shower/Tub: Occupational psychologist: Handicapped height     Home Equipment: Environmental consultant - 4 wheels;Shower seat - built in;Grab bars - tub/shower;Grab bars - toilet          Prior  Functioning/Environment Level of Independence: Independent        Comments: drives, cooks, cleans        OT Problem List: Decreased strength;Impaired balance (sitting and/or standing);Decreased knowledge of use of DME or AE;Decreased knowledge of precautions;Obesity      OT Treatment/Interventions: Self-care/ADL training;Energy conservation;DME and/or AE instruction;Therapeutic activities;Patient/family education;Balance training    OT Goals(Current goals can be found in the care plan section) Acute Rehab OT Goals Patient Stated Goal: rehab then home OT Goal Formulation: With patient Time For Goal Achievement: 10/05/16 Potential to Achieve Goals: Good ADL Goals Pt Will Perform Upper Body Bathing: with modified independence;sitting Pt Will Perform Lower Body Bathing: with modified independence;sit to/from stand Pt Will Perform Upper Body Dressing: with modified independence;sitting Pt Will Perform Lower Body Dressing: with modified independence;sit to/from stand Pt Will Transfer to Toilet: with modified independence;ambulating;bedside commode Pt Will Perform Toileting - Clothing Manipulation and hygiene: with modified independence;sit to/from stand Pt Will Perform Tub/Shower Transfer: with modified independence;Shower transfer;shower seat  OT Frequency: Min 2X/week   Barriers to D/C: Decreased caregiver support  pt lives alone       Co-evaluation              End of Session Nurse Communication: Mobility status  Activity Tolerance: Patient tolerated treatment well Patient left: in chair;with call bell/phone within reach  OT Visit Diagnosis: Unsteadiness on feet (R26.81);Muscle weakness (generalized) (M62.81)                Time: 0388-8280 OT Time Calculation (min): 13 min Charges:  OT General Charges $OT Visit: 1 Procedure OT Evaluation $OT Eval Moderate Complexity: 1 Procedure G-Codes:     Lucynda Rosano A. Ulice Brilliant, M.S., OTR/L Pager: [place] 09/21/2016, 12:39 PM

## 2016-09-21 NOTE — Progress Notes (Signed)
EPW removed per protocol. VSS. Pt remained on bedrest for one hour. Site clean, dry, intact with bandaid.   Fritz Pickerel, RN

## 2016-09-22 MED ORDER — ASPIRIN 325 MG PO TBEC: 325.0000 mg | DELAYED_RELEASE_TABLET | Freq: Every day | ORAL | 0 refills | Status: DC

## 2016-09-22 MED ORDER — FUROSEMIDE 10 MG/ML IJ SOLN
40.0000 mg | Freq: Once | INTRAMUSCULAR | Status: AC
  Administered 2016-09-22: 40 mg via INTRAVENOUS
  Filled 2016-09-22: qty 4

## 2016-09-22 MED ORDER — LOSARTAN POTASSIUM 25 MG PO TABS: 25.0000 mg | ORAL_TABLET | Freq: Every day | ORAL | Status: DC

## 2016-09-22 MED ORDER — POTASSIUM CHLORIDE CRYS ER 20 MEQ PO TBCR
40.0000 meq | EXTENDED_RELEASE_TABLET | Freq: Once | ORAL | Status: AC
  Administered 2016-09-22: 40 meq via ORAL
  Filled 2016-09-22: qty 2

## 2016-09-22 MED ORDER — FUROSEMIDE 40 MG PO TABS: 40.0000 mg | ORAL_TABLET | Freq: Every day | ORAL | Status: DC

## 2016-09-22 MED ORDER — POTASSIUM CHLORIDE CRYS ER 20 MEQ PO TBCR
40.0000 meq | EXTENDED_RELEASE_TABLET | Freq: Every day | ORAL | Status: DC
Start: 2016-09-22 — End: 2016-10-06

## 2016-09-22 MED ORDER — TRAMADOL HCL 50 MG PO TABS: 50.0000 mg | ORAL_TABLET | ORAL | 0 refills | Status: DC | PRN

## 2016-09-22 NOTE — Progress Notes (Signed)
0811-0844 Education completed with pt who voiced understanding. Encouraged IS and walking. Reviewed sternal precautions, watching sodium in diet and CRP 2. Will refer to [facility] program. Offered to put on discharge video but pt needed to go to bathroom for BM. Told pt to use call light when finished and notified RN to put on video later.  Pt had already walked this morning so did not walk again at this time. Graylon Good RN BSN 09/22/2016 8:47 AM

## 2016-09-22 NOTE — Progress Notes (Signed)
Physical Therapy Treatment Patient Details Name: Jasmin Allen MRN: [id number] DOB: 1940/07/24 Today's Date: 09/22/2016    History of Present Illness Patient is a 76 y/o female admitted due to severe aortic stenosis s/p minimally invasive AVR and intraoperative TEE.  PMH positive for AS, bicuspid aortic valve, HTN, HLD, arthritis and obesity.    PT Comments    Pt very pleasant and eager to return to walking every day and caring for herself. Pt educated for all precautions, transfers, gait, HEP and function with encouragement to continue progression. Pt able to state 3/5 precautions and perform bed mobility without assist today.  VSS throughout HR 102-112 sats 92% on RA   Follow Up Recommendations  SNF;Supervision for mobility/OOB     Equipment Recommendations  None recommended by PT    Recommendations for Other Services       Precautions / Restrictions Precautions Precautions: Sternal;Fall Precaution Comments: pt stated 3/5 precautions    Mobility  Bed Mobility Overal bed mobility: Needs Assistance Bed Mobility: Sit to Sidelying;Sidelying to Sit   Sidelying to sit: Min guard     Sit to sidelying: Min guard General bed mobility comments: cues for sequence with pt able to perform sit<>sidelying left with cues for sequence and safety  Transfers Overall transfer level: Needs assistance   Transfers: Sit to/from Stand Sit to Stand: Min guard;Min assist         General transfer comment: initial stand from chair min assist with cues and assist for anterior translation and rise, from bed only guarding  Ambulation/Gait Ambulation/Gait assistance: Min guard Ambulation Distance (Feet): 550 Feet Assistive device: Rolling walker (2 wheeled) Gait Pattern/deviations: Step-through pattern;Decreased stride length   Gait velocity interpretation: Below normal speed for age/gender General Gait Details: cues for posture and position in RW   Stairs             Wheelchair Mobility    Modified Rankin (Stroke Patients Only)       Balance Overall balance assessment: No apparent balance deficits (not formally assessed)                                          Cognition Arousal/Alertness: Awake/alert Behavior During Therapy: WFL for tasks assessed/performed Overall Cognitive Status: Within Functional Limits for tasks assessed                                        Exercises General Exercises - Lower Extremity Long Arc Quad: AROM;15 reps;Both;Seated Hip Flexion/Marching: AROM;15 reps;Both;Seated    General Comments        Pertinent Vitals/Pain Pain Assessment: No/denies pain    Home Living                      Prior Function            PT Goals (current goals can now be found in the care plan section) Progress towards PT goals: Progressing toward goals    Frequency    Min 3X/week      PT Plan Current plan remains appropriate    Co-evaluation             End of Session   Activity Tolerance: Patient tolerated treatment well Patient left: in chair;with call bell/phone within reach Nurse Communication: Mobility status PT  Visit Diagnosis: Difficulty in walking, not elsewhere classified (R26.2);Muscle weakness (generalized) (M62.81)     Time: 0757-3225 PT Time Calculation (min) (ACUTE ONLY): 23 min  Charges:  $Gait Training: 8-22 mins $Therapeutic Exercise: 8-22 mins                    G Codes:       Elwyn Reach, PT [phone]   [place] 09/22/2016, 10:38 AM

## 2016-09-22 NOTE — Care Management Note (Addendum)
Case Management Note Marvetta Gibbons RN, BSN Unit 2W-Case Manager [phone]  Patient Details  Name: Jasmin Allen MRN: [id number] Date of Birth: [birth year]  Subjective/Objective:   Pt admitted s/p AVR - tx from [facility] to 2W on 09/19/16                Action/Plan: PTA pt lived at home alone, per PT eval recommendations for SNF at discharge- CSW following for placement needs-   Expected Discharge Date:  09/22/16               Expected Discharge Plan:  [place]  In-House Referral:  Clinical Social Work  Discharge planning Services  CM Consult  Post Acute Care Choice:  NA Choice offered to:  NA  DME Arranged:    DME Agency:     HH Arranged:    [place] Agency:     Status of Service:  Completed, signed off  If discussed at [employer] of Stay Meetings, dates discussed:    Discharge Disposition: skilled facility   Additional Comments:  09/22/16- 0940- Marvetta Gibbons RN, CM- pt for d/c today to SNF- CSW following for placement needs- plan for [facility] SNF in [place], [place], [place] 09/22/2016, 9:41 AM

## 2016-09-22 NOTE — Progress Notes (Signed)
Clinical Social Worker facilitated patient discharge including contacting patient family and facility to confirm patient discharge plans.  Clinical information faxed to facility and family agreeable with plan.  Per nurse patients family will be taking patient to [facility] (SNF) .  RN Urban Gibson  to call [phone] for report prior to discharge.  Clinical Social Worker will sign off for now as social work intervention is no longer needed. Please consult [place] again if new need arises.  Jasmin Allen, MSW, [place]

## 2016-09-22 NOTE — Progress Notes (Addendum)
      [address]       [place] [address]             [phone]      5 Days Post-Op Procedure(s) (LRB): MINIMALLY INVASIVE AORTIC VALVE REPLACEMENT (AVR) THROUGH PARTIAL STERNOTOMY USING A 21MM EDWARDS INTUITY ELITE VALVE (N/A) TRANSESOPHAGEAL ECHOCARDIOGRAM (TEE) (N/A)   Subjective:  Feeling better.  Is ambulating with assistance   + BM  Objective: Vital signs in last 24 hours: Temp:  [98.2 F (36.8 C)-98.4 F (36.9 C)] 98.2 F (36.8 C) (04/23 0539) Pulse Rate:  [70-97] 97 (04/23 0539) Cardiac Rhythm: Sinus tachycardia (04/22 1900) Resp:  [18] 18 (04/23 0539) BP: (106-149)/(43-81) 149/62 (04/23 0539) SpO2:  [92 %-97 %] 97 % (04/23 0539) Weight:  [207 lb 4.8 oz (94 kg)] 207 lb 4.8 oz (94 kg) (04/23 0539)  Intake/Output from previous day: 04/22 0701 - 04/23 0700 In: 360 [P.O.:360] Out: 850 [Urine:850]  General appearance: alert, cooperative and no distress Heart: regular rate and rhythm Lungs: diminished breath sounds bibasilar Abdomen: soft, non-tender; bowel sounds normal; no masses,  no organomegaly Extremities: edema mild 1+ pitting Wound: clean and dry, minor dehiscence of right groin cannulation site  Lab Results:  Recent Labs  09/20/16 0411 09/21/16 0400  WBC 8.6 6.0  HGB 11.6* 9.7*  HCT 34.3* 29.5*  PLT 133* 133*   BMET:  Recent Labs  09/20/16 0411 09/21/16 0400  NA 135 134*  K 4.8 4.3  CL 101 100*  CO2 26 26  GLUCOSE 125* 129*  BUN 29* 30*  CREATININE 1.41* 1.05*  CALCIUM 8.6* 8.3*    PT/INR: No results for input(s): LABPROT, INR in the last 72 hours. ABG    Component Value Date/Time   PHART 7.304 (L) 09/17/2016 2204   HCO3 19.4 (L) 09/17/2016 2204   TCO2 23 09/18/2016 1711   ACIDBASEDEF 6.0 (H) 09/17/2016 2204   O2SAT 96.0 09/17/2016 2204   CBG (last 3)   Recent Labs  09/19/16 0830 09/19/16 1146  GLUCAP 145* 130*    Assessment/Plan: S/P Procedure(s) (LRB): MINIMALLY INVASIVE AORTIC VALVE REPLACEMENT  (AVR) THROUGH PARTIAL STERNOTOMY USING A 21MM EDWARDS INTUITY ELITE VALVE (N/A) TRANSESOPHAGEAL ECHOCARDIOGRAM (TEE) (N/A)  1. CV- Sinus Tach, + HTN- will continue home Coreg, restart home Cozaar at reduced dose 2. Pulm- no acute issues, off oxygen.. Small bilateral effusions... Continue IS 3. Renal- creatinine trending down to normal at 1.05, remains hypervolemic will continue Lasix 4. Expected blood loss anemia- Hgb 9.7 5. Deconditioning- SNF placement when bed available 6. Dispo- patient stable, will plan to d/c to SNF today if bed available   LOS: 5 days    BARRETT, ERIN 09/22/2016   I have seen and examined the patient and agree with the assessment and plan as outlined.  Doing well.  Still needs diuresis w/ LE edema and weight 5 lbs > preop but o/w looks ready for d/c.  Will give lasix IV today.  Restart Cozaar at reduced dose.  Rexene Alberts, MD 09/22/2016 8:51 AM

## 2016-09-22 NOTE — Care Management Important Message (Signed)
Important Message  Patient Details  Name: Jasmin Allen MRN: [id number] Date of Birth: 1941/04/22   Medicare Important Message Given:  Yes    Orbie Pyo 09/22/2016, 3:26 PM

## 2016-09-22 NOTE — Progress Notes (Signed)
Discussed with the patient and all questioned fully answered. She will call me if any problems arise.  Report called to [employer]. IV removed. Telemetry removed, CCMD notified. Pt given copy of DC instruction per request  Fritz Pickerel, RN

## 2016-10-05 NOTE — Progress Notes (Signed)
Cardiology Office Note    Date:  10/06/2016   ID:  Jasmin Allen, DOB June 10, 1940, MRN [id number]  PCP:  Jasmin Mad, MD  Cardiologist: Dr. Stanford Allen   Chief Complaint  Patient presents with  . Hospitalization Follow-up    s/p AVR    History of Present Illness:    Jasmin Allen is a 76 y.o. female with past medical history of bicuspid aortic valve/severe AS, HTN, HLD, and chronic diastolic CHF who presents to the office today for hospital follow-up.   She was examined by Dr. Stanford Allen in 07/2016 as a new patient referral for her bicuspid AV and severe AS. It was recommended to proceed with AVR, therefore a cardiac catheterization was recommended and showed normal cors with known severe AS. CT Surgery was consulted in the outpatient setting and she presented to [facility] on 09/15/2016 for the procedure. She underwent placement of an [employer] rapid deployment bovine pericardial tissue valve (size 21 mm, model # 8300AB, serial # [id number]) without any immediate complications. She maintained NSR and was restarted on anti-hypertensive medications throughout her admission. Discharged to SNF on 09/22/2016.  In talking with the patient today, she reports doing well since her recent surgery. She denies any exertional dyspnea or chest discomfort. She does have mild incisional pain but has not had to take any Rx pain medication or even Tylenol. She reports doing well at rehabilitation and is expected to be discharged home later this week. She is participating in physical therapy a few times a day and reports this is progressing well.  She denies any orthopnea, PND, or lower extremity edema. She has experienced a dry cough ever since her surgery and questions if this was due to her intubation tubes. Also reports not being able to taste or food well, but is unsure if this is due to to her surgery are the type of food she has been consuming at rehabilitation.  Past Medical History:    Diagnosis Date  . Aortic stenosis   . Arthritis   . Bicuspid aortic valve   . Cervical lymphadenopathy   . Chronic diastolic congestive heart failure ([place])   . H/O bronchitis 07/2013  . Heart murmur   . Hepatitis    JAUNDICE  1960  . Hyperlipemia   . Hypertension   . PONV (postoperative nausea and vomiting)   . 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 ABDOMINAL HYSTERECTOMY  1985  . AORTIC VALVE REPLACEMENT N/A 09/17/2016   Procedure: MINIMALLY INVASIVE AORTIC VALVE REPLACEMENT (AVR) THROUGH PARTIAL STERNOTOMY USING A 21MM EDWARDS INTUITY ELITE VALVE;  Surgeon: Rexene Alberts, MD;  Location: [place];  Service: Open Heart Surgery;  Laterality: N/A;  . [place]   rt hand  . CHOLECYSTECTOMY  01/03/2013   Procedure: LAPAROSCOPIC CHOLECYSTECTOMY attempted cholangiogram;  Surgeon: Ralene Ok, MD;  Location: [place];  Service: General;;  . DILATION AND CURETTAGE OF UTERUS     several  . HAMMER TOE SURGERY  10/01/2011   left toe  . LYMPH NODE BIOPSY  06/14/2012   Procedure: LYMPH NODE BIOPSY;  Surgeon: Haywood Lasso, MD;  Location: [place];  Service: General;  Laterality: Left;  remove left neck lymph node  . TEE WITHOUT CARDIOVERSION N/A 09/17/2016   Procedure: TRANSESOPHAGEAL  ECHOCARDIOGRAM (TEE);  Surgeon: Rexene Alberts, MD;  Location: [place];  Service: Open Heart Surgery;  Laterality: N/A;  . TONSILLECTOMY    . TUBAL LIGATION      Current Medications: Outpatient Medications Prior to Visit  Medication Sig Dispense Refill  . acetaminophen (TYLENOL) 650 MG CR tablet Take 650 mg by mouth every 8 (eight) hours as needed for pain. Has stopped prior to procedure    . aspirin EC 325 MG EC tablet Take 1 tablet (325 mg total) by mouth daily. 30 tablet 0  .  B Complex-C (B-COMPLEX WITH VITAMIN C) tablet Take 1 tablet by mouth every evening.    . Calcium Carbonate-Vitamin D3 (CALCIUM 600+D3) 600-400 MG-UNIT TABS Take 1 tablet by mouth at bedtime.    . carvedilol (COREG) 6.25 MG tablet Take 6.25 mg by mouth 2 (two) times daily with a meal.     . Cholecalciferol (VITAMIN D) 2000 UNITS tablet Take 2,000 Units by mouth at bedtime.     . Coenzyme Q10 (CO Q-10) 300 MG CAPS Take 300 mg by mouth at bedtime.    . fluticasone (FLONASE) 50 MCG/ACT nasal spray INSTILL 2 SPRAYS IN EACH NOSTRIL EVERY DAY (Patient taking differently: INSTILL 2 SPRAYS IN EACH NOSTRIL DAILY AS NEEDED FOR CONGESTION) 48 g 3  . folic acid (FOLVITE) 242 MCG tablet Take 400 mcg by mouth at bedtime.     Marland Kitchen loratadine (CLARITIN) 10 MG tablet Take 10 mg by mouth daily as needed for allergies.    Marland Kitchen losartan (COZAAR) 25 MG tablet Take 1 tablet (25 mg total) by mouth at bedtime.    . Menthol-Methyl Salicylate (MUSCLE RUB EX) Apply 1 application topically daily as needed (muscle pain).    . pravastatin (PRAVACHOL) 20 MG tablet Take 20 mg by mouth at bedtime.     . traMADol (ULTRAM) 50 MG tablet Take 1-2 tablets (50-100 mg total) by mouth every 4 (four) hours as needed for moderate pain. 40 tablet 0  . vitamin B-12 (CYANOCOBALAMIN) 1000 MCG tablet Take 1,000 mcg by mouth every evening.    . furosemide (LASIX) 40 MG tablet Take 1 tablet (40 mg total) by mouth daily. 30 tablet   . potassium chloride SA (K-DUR,KLOR-CON) 20 MEQ tablet Take 2 tablets (40 mEq total) by mouth daily.     No facility-administered medications prior to visit.      Allergies:   Penicillins and Sulfa antibiotics   Social History   Social History  . Marital status: Widowed    Spouse name: N/A  . Number of children: 1  . Years of education: N/A   Social History Main Topics  . Smoking status: Never Smoker  . Smokeless tobacco: Never Used  . Alcohol use Yes     Comment: occ  . Drug use: No  . Sexual activity: Not  Asked   Other Topics Concern  . None   Social History Narrative  . None     Family History:  The patient's family history includes Aortic stenosis in her mother; COPD in her father; Hypertension in her mother; Stroke in her maternal grandfather.   Review of Systems:   Please see the history of present illness.     General:  No chills, fever, night sweats or weight changes. Positive for dry cough.  Cardiovascular:  No chest pain, dyspnea on exertion, edema, orthopnea, palpitations, paroxysmal nocturnal dyspnea. Dermatological: No rash, lesions/masses Respiratory: No cough, dyspnea Urologic: No hematuria, dysuria Abdominal:   No nausea, vomiting,  diarrhea, bright red blood per rectum, melena, or hematemesis Neurologic:  No visual changes, wkns, changes in mental status. All other systems reviewed and are otherwise negative except as noted above.   Physical Exam:    VS:  BP (!) 100/58   Pulse 92   Ht 5\' 5"  (1.651 m)   Wt 192 lb (87.1 kg)   BMI 31.95 kg/m    General: Well developed, well nourished Caucasian female appearing in no acute distress. Head: Normocephalic, atraumatic, sclera non-icteric, no xanthomas, nares are without discharge.  Neck: No carotid bruits. JVD not elevated.  Lungs: Respirations regular and unlabored, without wheezes or rales.  Heart: Regular rate and rhythm. No S3 or S4.  No murmur, no rubs, or gallops appreciated. Sternal incision appears well-healing. No erythema or drainage noted.  Abdomen: Soft, non-tender, non-distended with normoactive bowel sounds. No hepatomegaly. No rebound/guarding. No obvious abdominal masses. Msk:  Strength and tone appear normal for age. No joint deformities or effusions. Extremities: No clubbing or cyanosis. No lower extremity edema.  Distal pedal pulses are 2+ bilaterally. Neuro: Alert and oriented X 3. Moves all extremities spontaneously. No focal deficits noted. Psych:  Responds to questions appropriately with a normal  affect. Skin: No rashes or lesions noted  Wt Readings from Last 3 Encounters:  10/06/16 192 lb (87.1 kg)  09/22/16 207 lb 4.8 oz (94 kg)  09/15/16 202 lb (91.6 kg)     Studies/Labs Reviewed:   EKG:  EKG is ordered today.  The ekg ordered today demonstrates NSR, HR 92, with no acute ST or T-wave changes.   Recent Labs: 08/07/2016: TSH 1.63 09/15/2016: ALT 19 09/18/2016: Magnesium 2.3 09/21/2016: BUN 30; Creatinine, Ser 1.05; Hemoglobin 9.7; Platelets 133; Potassium 4.3; Sodium 134   Lipid Panel No results found for: CHOL, TRIG, HDL, CHOLHDL, VLDL, LDLCALC, LDLDIRECT  Additional studies/ records that were reviewed today include:   Cardiac Catheterization: 07/2016 1. Widely patent coronary arteries (codominant RCA and left circumflex) 2. Known severe bicuspid valve aortic stenosis with heavy calcification and restriction of the aortic valve on plain fluoroscopy  Recommend:  Cardiac surgical evaluation for aortic valve replacement (appointment already scheduled)   TEE: 08/2016  Left ventricle: Normal cavity size and wall thickness. LV systolic function is normal with an EF of 55-60%. There are no obvious wall motion abnormalities.  Aortic valve: The valve is bicuspid. Severe valve thickening present. Severe valve calcification present. Moderately decreased leaflet separation. Severe stenosis. No regurgitation.  Mitral valve: Trace regurgitation.  Right ventricle: Normal cavity size and ejection fraction.  Pulmonic valve: Trace regurgitation.  Assessment:    1. Severe aortic stenosis   2. S/P AVR (aortic valve replacement)   3. Chronic diastolic heart failure ([place])   4. Medication management   5. Essential hypertension     Plan:   In order of problems listed above:  1. Bicuspid aortic valve/severe AS - prior echo reports have shown a bicuspid AV and severe AS. Underwent placement of an [employer] rapid deployment bovine pericardial tissue valve on  09/15/2016.  - she reports doing well since her recent hospitalization. Planning to discharge from SNF later this week. Denies any exertional chest pain or dyspnea on exertion. Sternal incision appears well-healing.  - will recheck an echocardiogram in 2-3 months. Repeat CBC (Hgb 9.7 at time of hospital discharge) and BMET today.  - continue ASA 325mg  daily. She has scheduled follow-up with Dr. Roxy Manns for next week.   2. Chronic Diastolic CHF - normal  EF of 55-60% by TEE in 08/2016. - she does not appear volume overloaded by physical examination. - will reduce Lasix from 40mg  daily to 20mg  daily. Stop K+ supplementation. Check BMET today.   3. HTN - BP well-controlled at 100/58. - continue Coreg 6.25mg  BID and Losartan 25mg  daily.   Medication Adjustments/Labs and Tests Ordered: Current medicines are reviewed at length with the patient today.  Concerns regarding medicines are outlined above.  Medication changes, Labs and Tests ordered today are listed in the Patient Instructions below. Patient Instructions  Medication Instructions: Your Furosemide has been decreased to 20 mg, one tablet daily. Please stop taking your potassium We have filled a prescription for Tessalon Perles. You may take a 100 mg pill three times daily as needed for cough.  Labwork: Please have the following labs drawn today: CBC, BMET  Follow-Up: Please keep your follow up appointment with Dr. Stanford Allen  If you need a refill on your cardiac medications before your next appointment, please call your pharmacy.   Signed, Erma Heritage, PA-C  10/06/2016 8:33 PM    [place], [place], [name]  [address] Phone: [phone]; Fax: [phone]  [address] [place], [place] [address] Phone: [phone]

## 2016-10-06 ENCOUNTER — Ambulatory Visit (INDEPENDENT_AMBULATORY_CARE_PROVIDER_SITE_OTHER): Payer: Medicare Other | Admitting: Student

## 2016-10-06 ENCOUNTER — Encounter: Payer: Self-pay | Admitting: Student

## 2016-10-06 ENCOUNTER — Other Ambulatory Visit: Payer: Self-pay | Admitting: Thoracic Surgery (Cardiothoracic Vascular Surgery)

## 2016-10-06 VITALS — BP 100/58 | HR 92 | Ht 65.0 in | Wt 192.0 lb

## 2016-10-06 DIAGNOSIS — Z79899 Other long term (current) drug therapy: Secondary | ICD-10-CM

## 2016-10-06 DIAGNOSIS — I5032 Chronic diastolic (congestive) heart failure: Secondary | ICD-10-CM

## 2016-10-06 DIAGNOSIS — Z952 Presence of prosthetic heart valve: Secondary | ICD-10-CM

## 2016-10-06 DIAGNOSIS — I350 Nonrheumatic aortic (valve) stenosis: Secondary | ICD-10-CM | POA: Diagnosis not present

## 2016-10-06 DIAGNOSIS — I10 Essential (primary) hypertension: Secondary | ICD-10-CM | POA: Diagnosis not present

## 2016-10-06 LAB — BASIC METABOLIC PANEL
BUN: 13 mg/dL (ref 7–25)
CALCIUM: 9.3 mg/dL (ref 8.6–10.4)
CO2: 29 mmol/L (ref 20–31)
Chloride: 100 mmol/L (ref 98–110)
Creat: 0.95 mg/dL — ABNORMAL HIGH (ref 0.60–0.93)
Glucose, Bld: 106 mg/dL — ABNORMAL HIGH (ref 65–99)
Potassium: 5 mmol/L (ref 3.5–5.3)
Sodium: 140 mmol/L (ref 135–146)

## 2016-10-06 LAB — CBC
HCT: 34.6 % — ABNORMAL LOW (ref 35.0–45.0)
Hemoglobin: 11.7 g/dL (ref 11.7–15.5)
MCH: 32.1 pg (ref 27.0–33.0)
MCHC: 33.8 g/dL (ref 32.0–36.0)
MCV: 94.8 fL (ref 80.0–100.0)
MPV: 8.8 fL (ref 7.5–12.5)
PLATELETS: 366 10*3/uL (ref 140–400)
RBC: 3.65 MIL/uL — AB (ref 3.80–5.10)
RDW: 14.1 % (ref 11.0–15.0)
WBC: 6.4 10*3/uL (ref 3.8–10.8)

## 2016-10-06 MED ORDER — BENZONATATE 100 MG PO CAPS: 100.0000 mg | ORAL_CAPSULE | Freq: Three times a day (TID) | ORAL | 1 refills | Status: DC | PRN

## 2016-10-06 MED ORDER — FUROSEMIDE 20 MG PO TABS: 20.0000 mg | ORAL_TABLET | Freq: Every day | ORAL | 3 refills | Status: DC

## 2016-10-06 NOTE — Patient Instructions (Signed)
Medication Instructions: Your Furosemide has been decreased to 20 mg, one tablet daily. Please stop taking your potassium We have filled a prescription for Tessalon Perles. You may take a 100 mg pill three times daily as needed for cough.  Labwork: Please have the following labs drawn today: CBC, BMET   Follow-Up: Please keep your follow up appointment with Dr. Stanford Breed   If you need a refill on your cardiac medications before your next appointment, please call your pharmacy.

## 2016-10-13 ENCOUNTER — Ambulatory Visit
Admission: RE | Admit: 2016-10-13 | Discharge: 2016-10-13 | Disposition: A | Discharge status: Home / Self Care | Payer: Medicare Other | Source: Ambulatory Visit | Attending: Thoracic Surgery (Cardiothoracic Vascular Surgery) | Admitting: Thoracic Surgery (Cardiothoracic Vascular Surgery)

## 2016-10-13 ENCOUNTER — Ambulatory Visit (INDEPENDENT_AMBULATORY_CARE_PROVIDER_SITE_OTHER): Payer: Self-pay | Admitting: Thoracic Surgery (Cardiothoracic Vascular Surgery)

## 2016-10-13 ENCOUNTER — Encounter: Payer: Self-pay | Admitting: Thoracic Surgery (Cardiothoracic Vascular Surgery)

## 2016-10-13 VITALS — BP 128/85 | HR 86 | Resp 20 | Ht 65.0 in | Wt 193.0 lb

## 2016-10-13 DIAGNOSIS — Z952 Presence of prosthetic heart valve: Secondary | ICD-10-CM

## 2016-10-13 DIAGNOSIS — Z953 Presence of xenogenic heart valve: Secondary | ICD-10-CM

## 2016-10-13 DIAGNOSIS — I350 Nonrheumatic aortic (valve) stenosis: Secondary | ICD-10-CM

## 2016-10-13 NOTE — Progress Notes (Signed)
[address]       [place] [address]             [phone]     CARDIOTHORACIC SURGERY OFFICE NOTE  Referring Provider is Lelon Perla, MD PCP is Lonia Mad, MD   HPI:  Patient is a 76 year old female who returns to the office today for routine follow-up status post aortic valve replacement using a rapid deployment bioprosthetic tissue valve via partial upper mini sternotomy incision on 09/17/2016 for severe symptomatic aortic stenosis.  The patient's early postoperative recovery in the hospital was notable only for some postoperative nausea and vomiting which resolved uneventfully. She was discharged from the hospital on the fifth postoperative day, and because she lives alone at home she was discharged to a local skilled nursing facility for rehabilitation.  Since then she has done well. She was seen in follow-up by Melvyn Neth at [facility] last week and she returns to our office for routine follow-up today. Last week she left the skilled nursing facility and she is now staying at home on her own and doing quite well. She states that she has had minimal soreness in her chest and she has not taking any sort of pain relievers since she left the hospital, not even Tylenol. She is sleeping well at night. She is walking every day and slowly increasing her much she goes. She states that she is breathing well and she denies significant exertional shortness of breath or chest discomfort. Appetite has been slow to come around. Overall she is pleased with her progress.   Current Outpatient Prescriptions  Medication Sig Dispense Refill  . acetaminophen (TYLENOL) 650 MG CR tablet Take 650 mg by mouth every 8 (eight) hours as needed for pain. Has stopped prior to procedure    . aspirin EC 325 MG EC tablet Take 1 tablet (325 mg total) by mouth daily. (Patient taking differently: Take 81 mg by mouth daily. Taking 4 tablets of the 81 mg per day) 30 tablet 0  . B  Complex-C (B-COMPLEX WITH VITAMIN C) tablet Take 1 tablet by mouth every evening.    . benzonatate (TESSALON PERLES) 100 MG capsule Take 1 capsule (100 mg total) by mouth 3 (three) times daily as needed for cough. 20 capsule 1  . Calcium Carbonate-Vitamin D3 (CALCIUM 600+D3) 600-400 MG-UNIT TABS Take 1 tablet by mouth at bedtime.    . carvedilol (COREG) 6.25 MG tablet Take 6.25 mg by mouth 2 (two) times daily with a meal.     . Cholecalciferol (VITAMIN D) 2000 UNITS tablet Take 2,000 Units by mouth at bedtime.     . Coenzyme Q10 (CO Q-10) 300 MG CAPS Take 300 mg by mouth at bedtime.    . fluticasone (FLONASE) 50 MCG/ACT nasal spray INSTILL 2 SPRAYS IN EACH NOSTRIL EVERY DAY (Patient taking differently: INSTILL 2 SPRAYS IN EACH NOSTRIL DAILY AS NEEDED FOR CONGESTION) 48 g 3  . folic acid (FOLVITE) 220 MCG tablet Take 400 mcg by mouth at bedtime.     . furosemide (LASIX) 20 MG tablet Take 1 tablet (20 mg total) by mouth daily. 90 tablet 3  . loratadine (CLARITIN) 10 MG tablet Take 10 mg by mouth daily as needed for allergies.    Marland Kitchen losartan (COZAAR) 25 MG tablet Take 1 tablet (25 mg total) by mouth at bedtime.    . Menthol-Methyl Salicylate (MUSCLE RUB EX) Apply 1 application topically daily as needed (muscle pain).    Marland Kitchen  pravastatin (PRAVACHOL) 20 MG tablet Take 20 mg by mouth at bedtime.     . traMADol (ULTRAM) 50 MG tablet Take 1-2 tablets (50-100 mg total) by mouth every 4 (four) hours as needed for moderate pain. 40 tablet 0  . vitamin B-12 (CYANOCOBALAMIN) 1000 MCG tablet Take 1,000 mcg by mouth every evening.     No current facility-administered medications for this visit.       Physical Exam:   BP 128/85   Pulse 86   Resp 20   Ht 5\' 5"  (1.651 m)   Wt 193 lb (87.5 kg)   SpO2 98% Comment: RA  BMI 32.12 kg/m   General:  Obese but well appearing  Chest:   Clear to auscultation  CV:   Regular rate and rhythm without murmur  Incisions:  Healing nicely, sternum is  stable  Abdomen:  Soft and nontender  Extremities:  Warm and well-perfused  Diagnostic Tests:  CHEST  2 VIEW  COMPARISON:  09/20/2016  FINDINGS: Median sternotomy for aortic valve repair. Midline trachea. Normal heart size. Mild right hemidiaphragm elevation. Resolved bilateral pleural effusions. No pneumothorax. No congestive failure. Improved bibasilar atelectasis.  IMPRESSION: Resolved bilateral pleural effusions with improved bibasilar atelectasis.  No pneumothorax or other acute complication after median sternotomy   Electronically Signed   By: Abigail Miyamoto M.D.   On: 10/13/2016 14:02   Impression:  Patient is doing very well approximately one month status post aortic valve replacement using a rapid deployment bioprosthetic tissue valve through a partial upper mini sternotomy incision.   Plan:  We have not recommended any changes to the patient's current medications at this time. I have encouraged patient to continue to gradually increase her physical activity as tolerated with her primary limitation remaining that she refrain from heavy lifting or strenuous use of her arms or shoulders for at least another 2 months. I think she may resume driving an automobile and I have encouraged her to enroll and participate in the outpatient cardiac rehabilitation program.  The patient has been reminded regarding the importance of dental hygiene and the lifelong need for antibiotic prophylaxis for all dental cleanings and other related invasive procedures.  The patient will continue to follow-up with Dr. Stanford Breed and return to our office for routine follow-up in approximately 2-3 months. At some point she will need a routine follow-up echocardiogram performed. All of her questions have been addressed.     Valentina Gu. Roxy Manns, MD 10/13/2016 2:36 PM

## 2016-10-13 NOTE — Patient Instructions (Signed)
Continue to avoid any heavy lifting or strenuous use of your arms or shoulders for at least a total of three months from the time of surgery.  After three months you may gradually increase how much you lift or otherwise use your arms or chest as tolerated, with limits based upon whether or not activities lead to the return of significant discomfort.  You may return to driving an automobile as long as you are no longer requiring oral narcotic pain relievers during the daytime.  It would be wise to start driving only short distances during the daylight and gradually increase from there as you feel comfortable.  Continue all previous medications without any changes at this time  You are encouraged to enroll and participate in the outpatient cardiac rehab program beginning as soon as practical.  Endocarditis is a potentially serious infection of heart valves or inside lining of the heart.  It occurs more commonly in patients with diseased heart valves (such as patient's with aortic or mitral valve disease) and in patients who have undergone heart valve repair or replacement.  Certain surgical and dental procedures may put you at risk, such as dental cleaning, other dental procedures, or any surgery involving the respiratory, urinary, gastrointestinal tract, gallbladder or prostate gland.   To minimize your chances for develooping endocarditis, maintain good oral health and seek prompt medical attention for any infections involving the mouth, teeth, gums, skin or urinary tract.    Always notify your doctor or dentist about your underlying heart valve condition before having any invasive procedures. You will need to take antibiotics before certain procedures, including all routine dental cleanings or other dental procedures.  Your cardiologist or dentist should prescribe these antibiotics for you to be taken ahead of time.

## 2016-10-14 ENCOUNTER — Encounter: Payer: Self-pay | Admitting: *Deleted

## 2016-10-14 NOTE — Progress Notes (Signed)
A referral was faxed to [facility], [place], [place]. Cardiac Rehab to contact Ms. Hammac to begin Phase II per the request of Dr. Darylene Price. Appropriate records were sent.

## 2016-10-15 ENCOUNTER — Telehealth: Payer: Self-pay | Admitting: *Deleted

## 2016-10-15 NOTE — Telephone Encounter (Signed)
Cardiac rehab orders faxed to the number provided.

## 2016-11-17 NOTE — Progress Notes (Signed)
HPI: FU bicuspid aortic valve and severe aortic stenosis.Last echo 06/12/16 showed normal LV systolic function, mild left atrial enlargement, mild mitral regurgitation, bicuspid aortic valve with trace aortic insufficiency and severe aortic stenosis with mean gradient 43 mmHg. I reviewed images personally and agreed with findings. CTA 2/18 showed no TAA; LLL pulmonary nodule; fu 12 months. Patient had cardiac catheterization 08/14/2016 showed no coronary disease. Carotid Dopplers April 2018 showed no significant stenosis. Patient had aortic valve replacement with a pericardial tissue valve in April 2018. Since last seen, the patient denies any dyspnea on exertion, orthopnea, PND, pedal edema, palpitations, syncope or chest pain.   Current Outpatient Prescriptions  Medication Sig Dispense Refill  . acetaminophen (TYLENOL) 650 MG CR tablet Take 650 mg by mouth every 8 (eight) hours as needed for pain. Has stopped prior to procedure    . aspirin EC 325 MG EC tablet Take 1 tablet (325 mg total) by mouth daily. (Patient taking differently: Take 81 mg by mouth daily. Taking 4 tablets of the 81 mg per day) 30 tablet 0  . B Complex-C (B-COMPLEX WITH VITAMIN C) tablet Take 1 tablet by mouth every evening.    . benzonatate (TESSALON PERLES) 100 MG capsule Take 1 capsule (100 mg total) by mouth 3 (three) times daily as needed for cough. 20 capsule 1  . Calcium Carbonate-Vitamin D3 (CALCIUM 600+D3) 600-400 MG-UNIT TABS Take 1 tablet by mouth at bedtime.    . carvedilol (COREG) 6.25 MG tablet Take 6.25 mg by mouth 2 (two) times daily with a meal.     . Cholecalciferol (VITAMIN D) 2000 UNITS tablet Take 2,000 Units by mouth at bedtime.     . Coenzyme Q10 (CO Q-10) 300 MG CAPS Take 300 mg by mouth at bedtime.    . fluticasone (FLONASE) 50 MCG/ACT nasal spray INSTILL 2 SPRAYS IN EACH NOSTRIL EVERY DAY (Patient taking differently: INSTILL 2 SPRAYS IN EACH NOSTRIL DAILY AS NEEDED FOR CONGESTION) 48 g 3  .  folic acid (FOLVITE) 315 MCG tablet Take 400 mcg by mouth at bedtime.     . furosemide (LASIX) 20 MG tablet Take 1 tablet (20 mg total) by mouth daily. 90 tablet 3  . loratadine (CLARITIN) 10 MG tablet Take 10 mg by mouth daily as needed for allergies.    Marland Kitchen losartan (COZAAR) 25 MG tablet Take 1 tablet (25 mg total) by mouth at bedtime.    . Menthol-Methyl Salicylate (MUSCLE RUB EX) Apply 1 application topically daily as needed (muscle pain).    . pravastatin (PRAVACHOL) 20 MG tablet Take 20 mg by mouth at bedtime.     . traMADol (ULTRAM) 50 MG tablet Take 1-2 tablets (50-100 mg total) by mouth every 4 (four) hours as needed for moderate pain. 40 tablet 0  . vitamin B-12 (CYANOCOBALAMIN) 1000 MCG tablet Take 1,000 mcg by mouth every evening.     No current facility-administered medications for this visit.      Past Medical History:  Diagnosis Date  . Aortic stenosis   . Arthritis   . Bicuspid aortic valve   . Cervical lymphadenopathy   . Chronic diastolic congestive heart failure ([place])   . H/O bronchitis 07/2013  . Heart murmur   . Hepatitis    JAUNDICE  1960  . Hyperlipemia   . Hypertension   . PONV (postoperative nausea and vomiting)   . 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 ABDOMINAL HYSTERECTOMY  1985  . AORTIC VALVE REPLACEMENT N/A 09/17/2016   Procedure: MINIMALLY INVASIVE AORTIC VALVE REPLACEMENT (AVR) THROUGH PARTIAL STERNOTOMY USING A 21MM EDWARDS INTUITY ELITE VALVE;  Surgeon: Rexene Alberts, MD;  Location: [place];  Service: Open Heart Surgery;  Laterality: N/A;  . [place]   rt hand  . CHOLECYSTECTOMY  01/03/2013   Procedure: LAPAROSCOPIC CHOLECYSTECTOMY attempted cholangiogram;  Surgeon: Ralene Ok, MD;  Location: [place];  Service: General;;  . DILATION  AND CURETTAGE OF UTERUS     several  . HAMMER TOE SURGERY  10/01/2011   left toe  . LYMPH NODE BIOPSY  06/14/2012   Procedure: LYMPH NODE BIOPSY;  Surgeon: Haywood Lasso, MD;  Location: [facility];  Service: General;  Laterality: Left;  remove left neck lymph node  . TEE WITHOUT CARDIOVERSION N/A 09/17/2016   Procedure: TRANSESOPHAGEAL ECHOCARDIOGRAM (TEE);  Surgeon: Rexene Alberts, MD;  Location: [place];  Service: Open Heart Surgery;  Laterality: N/A;  . TONSILLECTOMY    . TUBAL LIGATION      Social History   Social History  . Marital status: Widowed    Spouse name: N/A  . Number of children: 1  . Years of education: N/A   Occupational History  . Not on file.   Social History Main Topics  . Smoking status: Never Smoker  . Smokeless tobacco: Never Used  . Alcohol use Yes     Comment: occ  . Drug use: No  . Sexual activity: Not on file   Other Topics Concern  . Not on file   Social History Narrative  . No narrative on file    Family History  Problem Relation Age of Onset  . Hypertension Mother   . Aortic stenosis Mother   . COPD Father   . Stroke Maternal Grandfather     ROS: no fevers or chills, productive cough, hemoptysis, dysphasia, odynophagia, melena, hematochezia, dysuria, hematuria, rash, seizure activity, orthopnea, PND, pedal edema, claudication. Remaining systems are negative.  Physical Exam: Well-developed well-nourished in no acute distress.  Skin is warm and dry.  HEENT is normal.  Neck is supple.  Chest is clear to auscultation with normal expansion.  Cardiovascular exam is regular rate and rhythm. 2/6 systolic murmur left sternal border. No diastolic murmur. Abdominal exam nontender or distended. No masses palpated. Extremities show no edema. neuro grossly intact   A/P  1 S/P AVR-continue SBE prophylaxis. Schedule follow-up baseline echocardiogram s/p AVR..  2 hypertension-blood pressure is controlled. Continue present  medications.  3 lung nodule-patient will require follow-up chest CT February 2019.  4 hyperlipidemia-continue statin.  5 history of palpitations-continue beta blocker.  Kirk Ruths, MD

## 2016-11-20 ENCOUNTER — Telehealth: Payer: Self-pay | Admitting: Cardiology

## 2016-11-20 NOTE — Telephone Encounter (Signed)
Received records from [facility] for appointment on 11/24/16 with Dr Stanford Breed.  Records put with Dr Jacalyn Lefevre schedule for 11/24/16. lp

## 2016-11-24 ENCOUNTER — Ambulatory Visit (INDEPENDENT_AMBULATORY_CARE_PROVIDER_SITE_OTHER): Payer: Medicare Other | Admitting: Cardiology

## 2016-11-24 ENCOUNTER — Encounter: Payer: Self-pay | Admitting: Cardiology

## 2016-11-24 VITALS — BP 106/60 | HR 93 | Ht 65.0 in | Wt 193.0 lb

## 2016-11-24 DIAGNOSIS — E7800 Pure hypercholesterolemia, unspecified: Secondary | ICD-10-CM | POA: Diagnosis not present

## 2016-11-24 DIAGNOSIS — Z952 Presence of prosthetic heart valve: Secondary | ICD-10-CM | POA: Diagnosis not present

## 2016-11-24 DIAGNOSIS — I10 Essential (primary) hypertension: Secondary | ICD-10-CM | POA: Diagnosis not present

## 2016-11-24 DIAGNOSIS — R911 Solitary pulmonary nodule: Secondary | ICD-10-CM | POA: Diagnosis not present

## 2016-11-24 NOTE — Patient Instructions (Signed)

## 2016-11-27 ENCOUNTER — Ambulatory Visit (HOSPITAL_COMMUNITY): Payer: Medicare Other | Attending: Cardiology

## 2016-11-27 ENCOUNTER — Other Ambulatory Visit: Payer: Self-pay

## 2016-11-27 DIAGNOSIS — E785 Hyperlipidemia, unspecified: Secondary | ICD-10-CM | POA: Diagnosis not present

## 2016-11-27 DIAGNOSIS — Z9889 Other specified postprocedural states: Secondary | ICD-10-CM | POA: Insufficient documentation

## 2016-11-27 DIAGNOSIS — Q231 Congenital insufficiency of aortic valve: Secondary | ICD-10-CM | POA: Diagnosis not present

## 2016-11-27 DIAGNOSIS — I350 Nonrheumatic aortic (valve) stenosis: Secondary | ICD-10-CM | POA: Diagnosis not present

## 2016-11-27 DIAGNOSIS — I509 Heart failure, unspecified: Secondary | ICD-10-CM | POA: Diagnosis not present

## 2016-11-27 DIAGNOSIS — I071 Rheumatic tricuspid insufficiency: Secondary | ICD-10-CM | POA: Diagnosis not present

## 2016-11-27 DIAGNOSIS — Z952 Presence of prosthetic heart valve: Secondary | ICD-10-CM

## 2016-11-27 DIAGNOSIS — I110 Hypertensive heart disease with heart failure: Secondary | ICD-10-CM | POA: Diagnosis not present

## 2016-11-27 DIAGNOSIS — Z953 Presence of xenogenic heart valve: Secondary | ICD-10-CM | POA: Diagnosis not present

## 2017-01-12 ENCOUNTER — Encounter: Payer: Self-pay | Admitting: Thoracic Surgery (Cardiothoracic Vascular Surgery)

## 2017-01-12 ENCOUNTER — Ambulatory Visit (INDEPENDENT_AMBULATORY_CARE_PROVIDER_SITE_OTHER): Payer: Medicare Other | Admitting: Thoracic Surgery (Cardiothoracic Vascular Surgery)

## 2017-01-12 VITALS — BP 125/78 | HR 75 | Resp 20 | Ht 65.0 in | Wt 195.0 lb

## 2017-01-12 DIAGNOSIS — Z952 Presence of prosthetic heart valve: Secondary | ICD-10-CM | POA: Diagnosis not present

## 2017-01-12 DIAGNOSIS — I350 Nonrheumatic aortic (valve) stenosis: Secondary | ICD-10-CM | POA: Diagnosis not present

## 2017-01-12 DIAGNOSIS — Z953 Presence of xenogenic heart valve: Secondary | ICD-10-CM

## 2017-01-12 NOTE — Progress Notes (Signed)
[address]       [place] [address]             [phone]     CARDIOTHORACIC SURGERY OFFICE NOTE  Referring Provider is Lelon Perla, MD PCP is Lonia Mad, MD   HPI:  Patient returns to the office today for routine follow-up status post aortic valve replacement using a rapid deployment bioprosthetic tissue valve through a partial upper mini sternotomy on 09/17/2016 for severe symptomatic aortic stenosis. Her postoperative recovery has been uneventful and she was last seen here in our office on 10/13/2016 at which time she was doing well.  Since then she has continued to do well. She was seen in follow-up by Dr. Stanford Breed on 11/24/2016. Routine follow-up echocardiogram performed at that time revealed normal left ventricular systolic function with normal functioning bioprosthetic tissue valve in aortic position. There was no aortic stenosis and no aortic insufficiency. The patient returns to our office for routine follow-up today. She reports that she is doing exceptionally well. She has participated in outpatient cardiac rehabilitation program and made excellent progress. She denies any problems with exertional shortness of breath or chest discomfort. The soreness in her chest has resolved.   Current Outpatient Prescriptions  Medication Sig Dispense Refill  . acetaminophen (TYLENOL) 650 MG CR tablet Take 650 mg by mouth every 8 (eight) hours as needed for pain. Has stopped prior to procedure    . aspirin EC 325 MG EC tablet Take 1 tablet (325 mg total) by mouth daily. (Patient taking differently: Take 81 mg by mouth daily. Taking 4 tablets of the 81 mg per day) 30 tablet 0  . B Complex-C (B-COMPLEX WITH VITAMIN C) tablet Take 1 tablet by mouth every evening.    . benzonatate (TESSALON PERLES) 100 MG capsule Take 1 capsule (100 mg total) by mouth 3 (three) times daily as needed for cough. 20 capsule 1  . Calcium Carbonate-Vitamin D3 (CALCIUM 600+D3) 600-400  MG-UNIT TABS Take 1 tablet by mouth at bedtime.    . carvedilol (COREG) 6.25 MG tablet Take 6.25 mg by mouth 2 (two) times daily with a meal.     . Cholecalciferol (VITAMIN D) 2000 UNITS tablet Take 2,000 Units by mouth at bedtime.     . Coenzyme Q10 (CO Q-10) 300 MG CAPS Take 300 mg by mouth at bedtime.    . fluticasone (FLONASE) 50 MCG/ACT nasal spray INSTILL 2 SPRAYS IN EACH NOSTRIL EVERY DAY (Patient taking differently: INSTILL 2 SPRAYS IN EACH NOSTRIL DAILY AS NEEDED FOR CONGESTION) 48 g 3  . folic acid (FOLVITE) 734 MCG tablet Take 400 mcg by mouth at bedtime.     . furosemide (LASIX) 20 MG tablet Take 1 tablet (20 mg total) by mouth daily. 90 tablet 3  . levofloxacin (LEVAQUIN) 750 MG tablet Take 750 mg by mouth daily. 1/2 tab po once per day for UTI    . loratadine (CLARITIN) 10 MG tablet Take 10 mg by mouth daily as needed for allergies.    Marland Kitchen losartan (COZAAR) 25 MG tablet Take 1 tablet (25 mg total) by mouth at bedtime.    . Menthol-Methyl Salicylate (MUSCLE RUB EX) Apply 1 application topically daily as needed (muscle pain).    . pravastatin (PRAVACHOL) 20 MG tablet Take 20 mg by mouth at bedtime.     . traMADol (ULTRAM) 50 MG tablet Take 1-2 tablets (50-100 mg total) by mouth every 4 (four) hours as needed for moderate  pain. 40 tablet 0  . vitamin B-12 (CYANOCOBALAMIN) 1000 MCG tablet Take 1,000 mcg by mouth every evening.     No current facility-administered medications for this visit.       Physical Exam:   BP 125/78   Pulse 75   Resp 20   Ht 5\' 5"  (1.651 m)   Wt 195 lb (88.5 kg)   SpO2 99% Comment: RA  BMI 32.45 kg/m   General:  Well-appearing  Chest:   Clear to auscultation  CV:   Regular rate and rhythm without murmur  Incisions:  Completely healed, sternum is stable  Abdomen:  Soft and nontender  Extremities:  Warm and well-perfused  Diagnostic Tests:  Transthoracic Echocardiography  Patient:    Jasmin, Allen MR #:       [id number] Study Date:  11/27/2016 Gender:     F Age:        76 Height:     165.1 cm Weight:     87.5 kg BSA:        2.04 m^2 Pt. Status: Room:   Bennett Crenshaw  SONOGRAPHER  Diamond Nickel  ATTENDING    Ena Dawley, M.D.  PERFORMING   [facility], Outpatient  cc:  ------------------------------------------------------------------- LV EF: 65% -   70%  ------------------------------------------------------------------- Indications:      Z95.2 Aortic valve replacement.  ------------------------------------------------------------------- History:   PMH:  Bicuspid aortic valve. Severe aortic stenosis. Congestive heart failure.  Risk factors:  Hypertension. Dyslipidemia.  ------------------------------------------------------------------- Study Conclusions  - Left ventricle: The cavity size was normal. There was mild   concentric hypertrophy. Systolic function was vigorous. The   estimated ejection fraction was in the range of 65% to 70%. Wall   motion was normal; there were no regional wall motion   abnormalities. Doppler parameters are consistent with abnormal   left ventricular relaxation (grade 1 diastolic dysfunction).   Doppler parameters are consistent with elevated ventricular   end-diastolic filling pressure. - Aortic valve: A bioprosthesis sits well in the aortic position.   Transvalvular velocity was within the normal range. There was no   stenosis. There was no regurgitation. Peak gradient (S): 26 mm   Hg. - Mitral valve: There was no regurgitation. - Left atrium: The atrium was normal in size. - Tricuspid valve: There was mild regurgitation. - Pulmonary arteries: Systolic pressure was within the normal   range.  Impressions:  - S/P AVR with a rapid deployment bioprosthetic tissue valve.   Normal transaortic gradients, no central aortic regurgitation or   paravalvular  leak.  ------------------------------------------------------------------- Study data:  Comparison was made to the study of 09/17/2016.  Study status:  Routine.  Procedure:  The patient reported no pain pre or post test. Transthoracic echocardiography. Image quality was adequate.  Study completion:  There were no complications. Transthoracic echocardiography.  M-mode, complete 2D, spectral Doppler, and color Doppler.  Birthdate:  Patient birthdate: [birth year].  Age:  Patient is 75 yr old.  Sex:  Gender: female. BMI: 32.1 kg/m^2.  Blood pressure:     116/81  Patient status: Outpatient.  Study date:  Study date: 11/27/2016. Study time: 01:13 PM.  Location:  [facility]  -------------------------------------------------------------------  ------------------------------------------------------------------- Left ventricle:  The cavity size was normal. There was mild concentric hypertrophy. Systolic function was vigorous. The estimated ejection fraction was in the range of 65% to 70%. Wall motion was normal; there were no regional wall motion abnormalities. Doppler  parameters are consistent with abnormal left ventricular relaxation (grade 1 diastolic dysfunction). Doppler parameters are consistent with elevated ventricular end-diastolic filling pressure.  ------------------------------------------------------------------- Aortic valve:  A bioprosthesis sits well in the aortic position. Mobility was not restricted.  Doppler:  Transvalvular velocity was within the normal range. There was no stenosis. There was no regurgitation.    VTI ratio of LVOT to aortic valve: 0.49. Peak velocity ratio of LVOT to aortic valve: 0.38. Mean velocity ratio of LVOT to aortic valve: 0.44.    Mean gradient (S): 10 mm Hg. Peak gradient (S): 26 mm Hg.  ------------------------------------------------------------------- Aorta:  Aortic root: The aortic root was normal in  size.  ------------------------------------------------------------------- Mitral valve:   Structurally normal valve.   Mobility was not restricted.  Doppler:  Transvalvular velocity was within the normal range. There was no evidence for stenosis. There was no regurgitation.    Peak gradient (D): 3 mm Hg.  ------------------------------------------------------------------- Left atrium:  The atrium was normal in size.  ------------------------------------------------------------------- Right ventricle:  The cavity size was normal. Wall thickness was normal. Systolic function was normal.  ------------------------------------------------------------------- Pulmonic valve:    Structurally normal valve.   Cusp separation was normal.  Doppler:  Transvalvular velocity was within the normal range. There was no evidence for stenosis. There was no regurgitation.  ------------------------------------------------------------------- Tricuspid valve:   Structurally normal valve.    Doppler: Transvalvular velocity was within the normal range. There was mild regurgitation.  ------------------------------------------------------------------- Pulmonary artery:   The main pulmonary artery was normal-sized. Systolic pressure was within the normal range.  ------------------------------------------------------------------- Right atrium:  The atrium was normal in size.  ------------------------------------------------------------------- Pericardium:  There was no pericardial effusion.  ------------------------------------------------------------------- Systemic veins: Inferior vena cava: The vessel was normal in size.  ------------------------------------------------------------------- Measurements   Left ventricle                         Value        Reference  LV ID, ED, PLAX chordal        (L)     37.5  mm     43 - 52  LV ID, ES, PLAX chordal                23.5  mm     23 - 38   LV fx shortening, PLAX chordal         37    %      >=29  LV PW thickness, ED                    12.3  mm     ---------  IVS/LV PW ratio, ED                    0.88         <=1.3  LV e&', lateral                         8.55  cm/s   ---------  LV E/e&', lateral                       10.85        ---------  LV e&', medial                          5.04  cm/s   ---------  LV E/e&', medial  18.41        ---------  LV e&', average                         6.8   cm/s   ---------  LV E/e&', average                       13.66        ---------    Ventricular septum                     Value        Reference  IVS thickness, ED                      10.8  mm     ---------    LVOT                                   Value        Reference  LVOT peak velocity, S                  96.3  cm/s   ---------  LVOT mean velocity, S                  61.7  cm/s   ---------  LVOT VTI, S                            21.9  cm     ---------  LVOT peak gradient, S                  4     mm Hg  ---------    Aortic valve                           Value        Reference  Aortic valve peak velocity, S          256   cm/s   ---------  Aortic valve mean velocity, S          139   cm/s   ---------  Aortic valve VTI, S                    45    cm     ---------  Aortic mean gradient, S                9     mm Hg  ---------  Aortic peak gradient, S                26    mm Hg  ---------  VTI ratio, LVOT/AV                     0.49         ---------  Velocity ratio, peak, LVOT/AV          0.38         ---------  Velocity ratio, mean, LVOT/AV          0.44         ---------    Left atrium  Value        Reference  LA ID, A-P, ES                         38    mm     ---------  LA ID/bsa, A-P                         1.87  cm/m^2 <=2.2  LA volume, S                           38    ml     ---------  LA volume/bsa, S                       18.7  ml/m^2 ---------  LA volume, ES, 1-p A4C                  41    ml     ---------  LA volume/bsa, ES, 1-p A4C             20.1  ml/m^2 ---------  LA volume, ES, 1-p A2C                 32    ml     ---------  LA volume/bsa, ES, 1-p A2C             15.7  ml/m^2 ---------    Mitral valve                           Value        Reference  Mitral E-wave peak velocity            92.8  cm/s   ---------  Mitral A-wave peak velocity            110   cm/s   ---------  Mitral deceleration time       (H)     268   ms     150 - 230  Mitral peak gradient, D                3     mm Hg  ---------  Mitral E/A ratio, peak                 0.8          ---------    Pulmonary arteries                     Value        Reference  PA pressure, S, DP                     25    mm Hg  <=30    Tricuspid valve                        Value        Reference  Tricuspid regurg peak velocity         232   cm/s   ---------  Tricuspid peak RV-RA gradient          22    mm Hg  ---------    Systemic veins  Value        Reference  Estimated CVP                          3     mm Hg  ---------    Right ventricle                        Value        Reference  RV pressure, S, DP                     25    mm Hg  <=30  RV s&', lateral, S                      9.98  cm/s   ---------  Legend: (L)  and  (H)  mark values outside specified reference range.  ------------------------------------------------------------------- Prepared and Electronically Authenticated by  Ena Dawley, M.D. 2018-06-28T15:19:26   Impression:  Patient is doing very well nearly 4 months status post aortic valve replacement using a bioprosthetic tissue valve.  Plan:  I have instructed the patient that she may decrease her dose of aspirin to 81 mg daily. We have otherwise not recommended any changes to her current medications. She may resume unrestricted physical activity without any particular limitations.  The patient has been reminded regarding the importance of  dental hygiene and the lifelong need for antibiotic prophylaxis for all dental cleanings and other related invasive procedures.  She will return to our office next May, approximately 1 year following her original surgery. During the interim period time she will call and return sooner should specific problems or questions arise.  I spent in excess of 15 minutes during the conduct of this office consultation and >50% of this time involved direct face-to-face encounter with the patient for counseling and/or coordination of their care.    Valentina Gu. Roxy Manns, MD 01/12/2017 12:54 PM

## 2017-01-12 NOTE — Patient Instructions (Addendum)
You may resume unrestricted physical activity without any particular limitations at this time.  You may decrease your dose of aspirin to 81 mg/day.  Continue all other previous medications without any changes at this time  Endocarditis is a potentially serious infection of heart valves or inside lining of the heart.  It occurs more commonly in patients with diseased heart valves (such as patient's with aortic or mitral valve disease) and in patients who have undergone heart valve repair or replacement.  Certain surgical and dental procedures may put you at risk, such as dental cleaning, other dental procedures, or any surgery involving the respiratory, urinary, gastrointestinal tract, gallbladder or prostate gland.   To minimize your chances for develooping endocarditis, maintain good oral health and seek prompt medical attention for any infections involving the mouth, teeth, gums, skin or urinary tract.    Always notify your doctor or dentist about your underlying heart valve condition before having any invasive procedures. You will need to take antibiotics before certain procedures, including all routine dental cleanings or other dental procedures.  Your cardiologist or dentist should prescribe these antibiotics for you to be taken ahead of time.

## 2017-02-27 ENCOUNTER — Ambulatory Visit (HOSPITAL_COMMUNITY): Payer: Medicare Other | Admitting: Adult Health

## 2017-02-27 ENCOUNTER — Other Ambulatory Visit (HOSPITAL_COMMUNITY): Payer: Medicare Other

## 2017-03-04 ENCOUNTER — Other Ambulatory Visit (HOSPITAL_COMMUNITY): Payer: Self-pay | Admitting: *Deleted

## 2017-03-04 DIAGNOSIS — D649 Anemia, unspecified: Secondary | ICD-10-CM

## 2017-03-05 ENCOUNTER — Encounter (HOSPITAL_COMMUNITY): Payer: Medicare Other | Attending: Oncology

## 2017-03-05 ENCOUNTER — Encounter (HOSPITAL_COMMUNITY): Payer: Self-pay

## 2017-03-05 ENCOUNTER — Ambulatory Visit (HOSPITAL_COMMUNITY): Payer: Medicare Other | Admitting: Adult Health

## 2017-03-05 ENCOUNTER — Other Ambulatory Visit (HOSPITAL_COMMUNITY): Payer: Medicare Other

## 2017-03-05 ENCOUNTER — Encounter (HOSPITAL_BASED_OUTPATIENT_CLINIC_OR_DEPARTMENT_OTHER): Payer: Medicare Other | Admitting: Adult Health

## 2017-03-05 VITALS — BP 119/57 | HR 68 | Temp 98.2°F | Resp 20 | Wt 198.2 lb

## 2017-03-05 DIAGNOSIS — D649 Anemia, unspecified: Secondary | ICD-10-CM | POA: Insufficient documentation

## 2017-03-05 DIAGNOSIS — R590 Localized enlarged lymph nodes: Secondary | ICD-10-CM

## 2017-03-05 LAB — CBC WITH DIFFERENTIAL/PLATELET
Basophils Absolute: 0 10*3/uL (ref 0.0–0.1)
Basophils Relative: 0 %
EOS ABS: 0 10*3/uL (ref 0.0–0.7)
EOS PCT: 0 %
HCT: 34.3 % — ABNORMAL LOW (ref 36.0–46.0)
Hemoglobin: 11.7 g/dL — ABNORMAL LOW (ref 12.0–15.0)
LYMPHS ABS: 1.7 10*3/uL (ref 0.7–4.0)
Lymphocytes Relative: 35 %
MCH: 32.2 pg (ref 26.0–34.0)
MCHC: 34.1 g/dL (ref 30.0–36.0)
MCV: 94.5 fL (ref 78.0–100.0)
MONO ABS: 0.4 10*3/uL (ref 0.1–1.0)
MONOS PCT: 7 %
Neutro Abs: 2.9 10*3/uL (ref 1.7–7.7)
Neutrophils Relative %: 58 %
PLATELETS: 156 10*3/uL (ref 150–400)
RBC: 3.63 MIL/uL — ABNORMAL LOW (ref 3.87–5.11)
RDW: 13.5 % (ref 11.5–15.5)
WBC: 5 10*3/uL (ref 4.0–10.5)

## 2017-03-05 LAB — COMPREHENSIVE METABOLIC PANEL
ALT: 16 U/L (ref 14–54)
ANION GAP: 7 (ref 5–15)
AST: 20 U/L (ref 15–41)
Albumin: 3.9 g/dL (ref 3.5–5.0)
Alkaline Phosphatase: 100 U/L (ref 38–126)
BUN: 18 mg/dL (ref 6–20)
CHLORIDE: 103 mmol/L (ref 101–111)
CO2: 28 mmol/L (ref 22–32)
CREATININE: 0.85 mg/dL (ref 0.44–1.00)
Calcium: 9.3 mg/dL (ref 8.9–10.3)
Glucose, Bld: 124 mg/dL — ABNORMAL HIGH (ref 65–99)
Potassium: 4.3 mmol/L (ref 3.5–5.1)
Sodium: 138 mmol/L (ref 135–145)
Total Bilirubin: 0.9 mg/dL (ref 0.3–1.2)
Total Protein: 6.4 g/dL — ABNORMAL LOW (ref 6.5–8.1)

## 2017-03-05 LAB — LACTATE DEHYDROGENASE: LDH: 168 U/L (ref 98–192)

## 2017-03-05 NOTE — Patient Instructions (Signed)
[place] at [facility] Discharge Instructions  RECOMMENDATIONS MADE BY THE CONSULTANT AND ANY TEST RESULTS WILL BE SENT TO YOUR REFERRING PHYSICIAN.  You saw Mike Craze today.  Thank you for choosing [place] at [facility] to provide your oncology and hematology care.  To afford each patient quality time with our provider, please arrive at least 15 minutes before your scheduled appointment time.    If you have a lab appointment with the [facility] please come in thru the  Main Entrance and check in at the main information desk  You need to re-schedule your appointment should you arrive 10 or more minutes late.  We strive to give you quality time with our providers, and arriving late affects you and other patients whose appointments are after yours.  Also, if you no show three or more times for appointments you may be dismissed from the clinic at the providers discretion.     Again, thank you for choosing [facility]  Our hope is that these requests will decrease the amount of time that you wait before being seen by our physicians.       _____________________________________________________________  Should you have questions after your visit to [facility], please contact our office at [phone] between the hours of 8:30 a.m. and 4:30 p.m.  Voicemails left after 4:30 p.m. will not be returned until the following business day.  For prescription refill requests, have your pharmacy contact our office.       Resources For Cancer Patients and their Caregivers ? American Cancer Society: Can assist with transportation, wigs, general needs, runs Look Good Feel Better.        252-242-0688 ? Cancer Care: Provides financial assistance, online support groups, medication/co-pay assistance.  1-800-813-HOPE 938-630-3930) ? [place] Assists [place] cancer patients and their families  through emotional , educational and financial support.  937-559-7950 ? [place] DSS Where to apply for food stamps, Medicaid and utility assistance. 3164151156 ? [facility]: Transportation to medical appointments. 9070909800 ? Social Security Administration: May apply for disability if have a Stage IV cancer. 314-361-8422 501-740-9123 ? [employer], Disability and Transit Services: Assists with nutrition, care and transit needs. [place] Support Programs: @10RELATIVEDAYS @ > Cancer Support Group  2nd Tuesday of the month 1pm-2pm, Journey Room  > Creative Journey  3rd Tuesday of the month 1130am-1pm, Journey Room  > Look Good Feel Better  1st Wednesday of the month 10am-12 noon, Journey Room (Call [place] to register [phone])

## 2017-03-05 NOTE — Progress Notes (Signed)
[name], [name] [address]   CLINIC:  Medical Oncology/Hematology  PCP:  Lonia Mad, MD No address on file None   REASON FOR VISIT:  Follow-up for Lymphadenopathy of left neck   CURRENT THERAPY: Observation     HISTORY OF PRESENT ILLNESS:  (From Dr. Donald Pore last note on 02/22/16)     INTERVAL HISTORY:  [name] 76 y.o. female returns for routine follow-up for neck lymphadenopathy.    Overall, she tells me she has been feeling quite well.  Appetite 100%; energy levels 75%. She has intermittent fatigue "and I feel like I get tired a little more quickly than normal."    Since her last visit to the cancer center, she has undergone several procedures for her heart, including valve replacement. She sees her cardiologist regularly.    Denies any new adenopathy. Denies fever without infection, abnormal bruising, frank bleeding episodes, unintentional weight loss, or night sweats.  Does report 1 episode of fever to 61 F, but was found to have a recurrent UTI that was treated with antibiotics. Fever and infection both resolved and she has had no further complaints regarding this.    She tells me that her mammogram, colonoscopy, and vaccinations are up-to-date.  She plans to get her flu shot sometime next month.  She sees her PCP regularly.    Otherwise, she is largely without complaints today.  She tells me that it has been several years since she was diagnosed with "possible cancer"; she is hoping she can be released from follow-up at cancer center today.     REVIEW OF SYSTEMS:  Review of Systems  Constitutional: Positive for fatigue. Negative for chills and fever.  HENT:  Negative.  Negative for lump/mass and nosebleeds.   Eyes: Negative.   Respiratory: Negative.  Negative for cough and shortness of breath.   Cardiovascular: Negative.  Negative for chest pain and leg swelling.  Gastrointestinal: Negative.  Negative for  abdominal pain, blood in stool, constipation, diarrhea, nausea and vomiting.  Endocrine: Negative.   Genitourinary: Negative.  Negative for dysuria and hematuria.   Musculoskeletal: Negative.  Negative for arthralgias.  Skin: Negative.  Negative for rash.  Neurological: Negative.  Negative for dizziness and headaches.  Hematological: Negative.  Negative for adenopathy. Does not bruise/bleed easily.  Psychiatric/Behavioral: Negative.  Negative for depression and sleep disturbance. The patient is not nervous/anxious.      PAST MEDICAL/SURGICAL HISTORY:  Past Medical History:  Diagnosis Date  . Aortic stenosis   . Arthritis   . Bicuspid aortic valve   . Cervical lymphadenopathy   . Chronic diastolic congestive heart failure ([place])   . H/O bronchitis 07/2013  . Heart murmur   . Hepatitis    JAUNDICE  1960  . Hyperlipemia   . Hypertension   . PONV (postoperative nausea and vomiting)   . 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 ABDOMINAL HYSTERECTOMY  1985  . AORTIC VALVE REPLACEMENT N/A 09/17/2016   Procedure: MINIMALLY INVASIVE AORTIC VALVE REPLACEMENT (AVR) THROUGH PARTIAL STERNOTOMY USING A 21MM EDWARDS INTUITY ELITE VALVE;  Surgeon: Rexene Alberts, MD;  Location: [facility];  Service: Open Heart Surgery;  Laterality: N/A;  . Fernando Salinas   rt hand  . CHOLECYSTECTOMY  01/03/2013   Procedure: LAPAROSCOPIC CHOLECYSTECTOMY  attempted cholangiogram;  Surgeon: Ralene Ok, MD;  Location: [facility];  Service: General;;  . DILATION AND CURETTAGE OF UTERUS     several  . HAMMER TOE SURGERY  10/01/2011   left toe  . LYMPH NODE BIOPSY  06/14/2012   Procedure: LYMPH NODE BIOPSY;  Surgeon: Haywood Lasso, MD;  Location: [facility];  Service: General;  Laterality: Left;   remove left neck lymph node  . TEE WITHOUT CARDIOVERSION N/A 09/17/2016   Procedure: TRANSESOPHAGEAL ECHOCARDIOGRAM (TEE);  Surgeon: Rexene Alberts, MD;  Location: [facility];  Service: Open Heart Surgery;  Laterality: N/A;  . TONSILLECTOMY    . TUBAL LIGATION       SOCIAL HISTORY:  Social History   Social History  . Marital status: Widowed    Spouse name: N/A  . Number of children: 1  . Years of education: N/A   Occupational History  . Not on file.   Social History Main Topics  . Smoking status: Never Smoker  . Smokeless tobacco: Never Used  . Alcohol use Yes     Comment: occ  . Drug use: No  . Sexual activity: Not Currently   Other Topics Concern  . Not on file   Social History Narrative  . No narrative on file    FAMILY HISTORY:  Family History  Problem Relation Age of Onset  . Hypertension Mother   . Aortic stenosis Mother   . COPD Father   . Stroke Maternal Grandfather     CURRENT MEDICATIONS:  Outpatient Encounter Prescriptions as of 03/05/2017  Medication Sig  . acetaminophen (TYLENOL) 650 MG CR tablet Take 650 mg by mouth every 8 (eight) hours as needed for pain. Has stopped prior to procedure  . aspirin EC 325 MG EC tablet Take 1 tablet (325 mg total) by mouth daily. (Patient taking differently: Take 81 mg by mouth daily. Taking 4 tablets of the 81 mg per day)  . B Complex-C (B-COMPLEX WITH VITAMIN C) tablet Take 1 tablet by mouth every evening.  . benzonatate (TESSALON PERLES) 100 MG capsule Take 1 capsule (100 mg total) by mouth 3 (three) times daily as needed for cough.  . Calcium Carbonate-Vitamin D3 (CALCIUM 600+D3) 600-400 MG-UNIT TABS Take 1 tablet by mouth at bedtime.  . carvedilol (COREG) 6.25 MG tablet Take 6.25 mg by mouth 2 (two) times daily with a meal.   . Cholecalciferol (VITAMIN D) 2000 UNITS tablet Take 2,000 Units by mouth at bedtime.   . Coenzyme Q10 (CO Q-10) 300 MG CAPS Take 300 mg by mouth at bedtime.  . fluticasone (FLONASE) 50 MCG/ACT  nasal spray INSTILL 2 SPRAYS IN EACH NOSTRIL EVERY DAY (Patient taking differently: INSTILL 2 SPRAYS IN EACH NOSTRIL DAILY AS NEEDED FOR CONGESTION)  . folic acid (FOLVITE) 967 MCG tablet Take 400 mcg by mouth at bedtime.   . furosemide (LASIX) 20 MG tablet Take 1 tablet (20 mg total) by mouth daily.  Marland Kitchen loratadine (CLARITIN) 10 MG tablet Take 10 mg by mouth daily as needed for allergies.  Marland Kitchen losartan (COZAAR) 25 MG tablet Take 1 tablet (25 mg total) by mouth at bedtime.  . Menthol-Methyl Salicylate (MUSCLE RUB EX) Apply 1 application topically daily as needed (muscle pain).  . pravastatin (PRAVACHOL) 20 MG tablet Take 20 mg by mouth at bedtime.   . traMADol (ULTRAM) 50 MG tablet Take 1-2 tablets (50-100 mg total) by mouth every 4 (four) hours as needed for moderate pain.  . vitamin B-12 (  CYANOCOBALAMIN) 1000 MCG tablet Take 1,000 mcg by mouth every evening.  . [DISCONTINUED] levofloxacin (LEVAQUIN) 750 MG tablet Take 750 mg by mouth daily. 1/2 tab po once per day for UTI   No facility-administered encounter medications on file as of 03/05/2017.     ALLERGIES:  Allergies  Allergen Reactions  . Penicillins Swelling    Has patient had a PCN reaction causing immediate rash, facial/tongue/throat swelling, SOB or lightheadedness with hypotension: Unknown Has patient had a PCN reaction causing severe rash involving mucus membranes or skin necrosis: Unknown Has patient had a PCN reaction that required hospitalization No Has patient had a PCN reaction occurring within the last 10 years: No If all of the above answers are "NO", then may proceed with Cephalosporin use.   . Sulfa Antibiotics Hives     PHYSICAL EXAM:  ECOG Performance status: 0 - Asymptomatic   Vitals:   03/05/17 0944  BP: (!) 119/57  Pulse: 68  Resp: 20  Temp: 98.2 F (36.8 C)  SpO2: 98%   Filed Weights   03/05/17 0944  Weight: 198 lb 3.2 oz (89.9 kg)    Physical Exam  Constitutional: She is oriented to person, place,  and time and well-developed, well-nourished, and in no distress.  HENT:  Head: Normocephalic.  Mouth/Throat: Oropharynx is clear and moist. No oropharyngeal exudate.  Eyes: Pupils are equal, round, and reactive to light. Conjunctivae are normal. No scleral icterus.  Neck: Normal range of motion. Neck supple.  Cardiovascular: Normal rate and regular rhythm.   Pulmonary/Chest: Effort normal and breath sounds normal. No respiratory distress.  Abdominal: Soft. Bowel sounds are normal. There is no tenderness.  Musculoskeletal: Normal range of motion. She exhibits no edema.  Lymphadenopathy:       Head (right side): No submental and no submandibular adenopathy present.       Head (left side): No submental and no submandibular adenopathy present.    She has no cervical adenopathy.       Right cervical: No posterior cervical adenopathy present.      Left cervical: No posterior cervical adenopathy present.    She has no axillary adenopathy.       Right: No supraclavicular adenopathy present.       Left: No supraclavicular adenopathy present.  Neurological: She is alert and oriented to person, place, and time. No cranial nerve deficit. Gait normal.  Skin: Skin is warm and dry. No rash noted.  Psychiatric: Mood, memory, affect and judgment normal.  Nursing note and vitals reviewed.    LABORATORY DATA:  I have reviewed the labs as listed.  CBC    Component Value Date/Time   WBC 5.0 03/05/2017 0914   RBC 3.63 (L) 03/05/2017 0914   HGB 11.7 (L) 03/05/2017 0914   HCT 34.3 (L) 03/05/2017 0914   PLT 156 03/05/2017 0914   MCV 94.5 03/05/2017 0914   MCH 32.2 03/05/2017 0914   MCHC 34.1 03/05/2017 0914   RDW 13.5 03/05/2017 0914   LYMPHSABS 1.7 03/05/2017 0914   MONOABS 0.4 03/05/2017 0914   EOSABS 0.0 03/05/2017 0914   BASOSABS 0.0 03/05/2017 0914   CMP Latest Ref Rng & Units 03/05/2017 10/06/2016 09/21/2016  Glucose 65 - 99 mg/dL 124(H) 106(H) 129(H)  BUN 6 - 20 mg/dL 18 13 30(H)    Creatinine 0.44 - 1.00 mg/dL 0.85 0.95(H) 1.05(H)  Sodium 135 - 145 mmol/L 138 140 134(L)  Potassium 3.5 - 5.1 mmol/L 4.3 5.0 4.3  Chloride 101 - 111 mmol/L 103  100 100(L)  CO2 22 - 32 mmol/L 28 29 26   Calcium 8.9 - 10.3 mg/dL 9.3 9.3 8.3(L)  Total Protein 6.5 - 8.1 g/dL 6.4(L) - -  Total Bilirubin 0.3 - 1.2 mg/dL 0.9 - -  Alkaline Phos 38 - 126 U/L 100 - -  AST 15 - 41 U/L 20 - -  ALT 14 - 54 U/L 16 - -    PENDING LABS:    DIAGNOSTIC IMAGING:  *The following radiologic images and reports have been reviewed independently and agree with below findings.  PET scan: 07/20/12 Clinical Data: Initial treatment strategy for staging of lymphoma. Lymphadenopathy of left cervical region.  History of splenomegaly. Biopsy of left supraclavicular lymph node suspicious but not diagnostic of lymphoma.  NUCLEAR MEDICINE PET SKULL BASE TO THIGH  Fasting Blood Glucose:  103  Technique:  19.2 mCi F-18 FDG was injected intravenously. CT data was obtained and used for attenuation correction and anatomic localization only.  (This was not acquired as a diagnostic CT examination.) Additional exam technical data entered on technologist worksheet.  Comparison:  None.  Findings:  Neck: Right submandibular activity which is primarily felt to be physiologic and related to the submandibular gland.  There is a 9 mm node anterior to the submandibular gland which measures a S.U.V. max of 2.8 on image 39/series 2.  Chest:  Diffuse hypermetabolism involves the left trapezius musculature, without CT correlate.  No hypermetabolic nodes within the chest.  Abdomen/Pelvis:  No hypermetabolic nodes within the abdomen or pelvis.  There is bilateral greater trochanteric bursitis.  Skeleton:  No abnormal osseous activity to suggest marrow involvement.  CT  images performed for attenuation correction demonstrate no further findings within the neck.  Aortic valvular calcifications versus prior  valvuloplasty.  Small hiatal hernia.  No evidence of splenomegaly.  Spleen measures less than 10 cm.  Hysterectomy.  IMPRESSION:  1.  No convincing evidence of active lymphoma within the neck, chest, abdomen, or pelvis. 2.  A small right submandibular node demonstrates mild low level hypermetabolism.  Favored to be physiologic.  Consider physical exam surveillance. 3.  Aortic valvular calcification versus prior valve repair.  If there has not been prior surgery to the aortic valve, echocardiography should be considered. 4.  Hypermetabolism about the left trapezius musculature could be post-traumatic or due to movement after radiopharmaceutical injection.   Original Report Authenticated By: Abigail Miyamoto, M.D.    PATHOLOGY:  (L) neck biopsy: 06/14/12                ASSESSMENT & PLAN:   Lymphadenopathy:  -Initially noted to have (L) cervical lymphadenopathy in 2014. (L) neck was biopsied on 06/14/12 revealing atypical lymphoid proliferation suspicious for Non-Hodgkin's B-cell lymphoma. However, flow cytometry negative; BCL-2 negative; PCR for immunoglobulin heavy chain gene negative.  PET scan in 07/2012 without convincing hypermetabolic adenopathy in the neck, chest, abd, or pelvis.  -Clinically, no evidence of lymphadenopathy on exam today.  No reported B symptoms.  LDH and other labs are stable. Discussed with Dr. Talbert Cage. We feel very comfortable releasing her from further follow-up here at the cancer center.  Of course, if she begins to experience any concerning symptoms, we would be happy to see her again at any time in the future. Patient is enthusiastic and agrees with this plan.  -No further follow-up needed at this time.    Mild anemia:  -Hgb 11.7 g/dL today and has been stable for quite some time.  Denies any bleeding episodes. Briefly  discussed with her the possible causes of anemia. Given that her anemia is very mild and she is largely asymptomatic, would defer  to her PCP for continued monitoring.    Health maintenance/Wellness promotion:  -Reportedly up-to-date with mammogram, colonoscopy, and vaccinations. She plans to get her flu shot next month with PCP.  Encourage healthy diet and exercise as tolerated.       Dispo:  -Return to cancer center only as needed. No further follow-up visits scheduled at this time. I wished her well and encouraged her to let us know if we can participate in her care again in the future.    All questions were answered to patient's stated satisfaction. Encouraged patient to call with any new concerns or questions.  Plan of care discussed with Dr. Talbert Cage, who agrees with the above aforementioned.    Orders placed this encounter:  No orders of the defined types were placed in this encounter.     Mike Craze, NP [place] [phone]

## 2017-06-25 ENCOUNTER — Telehealth: Payer: Self-pay | Admitting: *Deleted

## 2017-06-25 DIAGNOSIS — R918 Other nonspecific abnormal finding of lung field: Secondary | ICD-10-CM

## 2017-06-25 NOTE — Telephone Encounter (Signed)
Left message for pt to call, she is due to have a CT wo to follow up on a lung nodule. ? If there is some where closer to her that this test can be done?

## 2017-06-26 NOTE — Telephone Encounter (Signed)
Spoke with pt, she would like to get the scan @ [facility] in [place]. Per violet at [facility], CT order incrluding NPI# faxed to [phone]. Patient aware they will contact her to schedule testing.

## 2017-06-26 NOTE — Telephone Encounter (Signed)
Pt returning this office call please give her a call back.  °

## 2017-09-14 ENCOUNTER — Ambulatory Visit (INDEPENDENT_AMBULATORY_CARE_PROVIDER_SITE_OTHER): Payer: Medicare Other | Admitting: Thoracic Surgery (Cardiothoracic Vascular Surgery)

## 2017-09-14 ENCOUNTER — Encounter: Payer: Self-pay | Admitting: Thoracic Surgery (Cardiothoracic Vascular Surgery)

## 2017-09-14 ENCOUNTER — Other Ambulatory Visit: Payer: Self-pay

## 2017-09-14 VITALS — BP 122/76 | HR 73 | Resp 18 | Ht 65.0 in | Wt 202.6 lb

## 2017-09-14 DIAGNOSIS — Z953 Presence of xenogenic heart valve: Secondary | ICD-10-CM | POA: Diagnosis not present

## 2017-09-14 NOTE — Progress Notes (Signed)
[address]       ,[place] [address]             [phone]     CARDIOTHORACIC SURGERY OFFICE NOTE  Referring Provider is Stanford Breed, Denice Bors, MD PCP is Lonia Mad, MD   HPI:  Patient has a 77 year old female with bicuspid aortic valve who returns to the office today for routine follow-up approximately 1 year status post aortic valve replacement using a rapid deployment bioprosthetic tissue valve through a partial upper mini sternotomy on 09/17/2016 for severe symptomatic aortic stenosis.  Her postoperative recovery was uneventful and she was last seen here in our office on January 12, 2017 at which time she was doing quite well.  Routine follow-up echocardiogram performed November 24, 2016 revealed normal left ventricular systolic function with normal functioning bioprosthetic tissue valve in the aortic position.  Mean transvalvular gradient across the aortic valve is estimated 9 mmHg and there was no aortic insufficiency.  She returns the office today and reports that she is doing very well.  Overall she feels "much improved" in comparison with how she felt prior to surgery.  She is walking at least 3 times a week and she states that she does not experience any symptoms of exertional shortness of breath or chest discomfort.  Overall her exercise tolerance is noticeably much better than it was prior to surgery.  Overall she is delighted with her outcome.  Current Outpatient Medications  Medication Sig Dispense Refill  . acetaminophen (TYLENOL) 650 MG CR tablet Take 650 mg by mouth every 8 (eight) hours as needed for pain. Has stopped prior to procedure    . aspirin EC 325 MG EC tablet Take 1 tablet (325 mg total) by mouth daily. (Patient taking differently: Take 81 mg by mouth daily. Taking 4 tablets of the 81 mg per day) 30 tablet 0  . B Complex-C (B-COMPLEX WITH VITAMIN C) tablet Take 1 tablet by mouth every evening.    . Calcium Carbonate-Vitamin D3 (CALCIUM 600+D3)  600-400 MG-UNIT TABS Take 1 tablet by mouth at bedtime.    . carvedilol (COREG) 6.25 MG tablet Take 6.25 mg by mouth 2 (two) times daily with a meal.     . Cholecalciferol (VITAMIN D) 2000 UNITS tablet Take 2,000 Units by mouth at bedtime.     . Coenzyme Q10 (CO Q-10) 300 MG CAPS Take 300 mg by mouth at bedtime.    . fluticasone (FLONASE) 50 MCG/ACT nasal spray INSTILL 2 SPRAYS IN EACH NOSTRIL EVERY DAY (Patient taking differently: INSTILL 2 SPRAYS IN EACH NOSTRIL DAILY AS NEEDED FOR CONGESTION) 48 g 3  . folic acid (FOLVITE) 628 MCG tablet Take 400 mcg by mouth at bedtime.     . furosemide (LASIX) 20 MG tablet Take 1 tablet (20 mg total) by mouth daily. 90 tablet 3  . loratadine (CLARITIN) 10 MG tablet Take 10 mg by mouth daily as needed for allergies.    Marland Kitchen losartan (COZAAR) 25 MG tablet Take 1 tablet (25 mg total) by mouth at bedtime.    . Menthol-Methyl Salicylate (MUSCLE RUB EX) Apply 1 application topically daily as needed (muscle pain).    . pravastatin (PRAVACHOL) 20 MG tablet Take 20 mg by mouth at bedtime.     . vitamin B-12 (CYANOCOBALAMIN) 1000 MCG tablet Take 1,000 mcg by mouth every evening.    Marland Kitchen ALPRAZolam (XANAX) 0.25 MG tablet TAKE 1 TABLET BY MOUTH TWICE A DAY AS NEEDED FOR EAR  RINGING FOR 14 DAYS. DX: H93.11  0  . [employer] BUDESONIDE 32 MCG/ACT nasal spray Place 1 spray into both nostrils 2 (two) times daily.  3   No current facility-administered medications for this visit.       Physical Exam:   BP 122/76 (BP Location: Right Arm, Patient Position: Sitting, Cuff Size: Large)   Pulse 73   Resp 18   Ht 5\' 5"  (1.651 m)   Wt 202 lb 9.6 oz (91.9 kg)   SpO2 98% Comment: RA  BMI 33.71 kg/m   General:  Well-appearing  Chest:   Clear to auscultation  CV:   Regular rate and rhythm without murmur  Incisions:  Completely healed  Abdomen:  Soft nontender  Extremities:  Warm and well-perfused  Diagnostic Tests:  n/a   Impression:  Patient is doing very well approximately 1  year status post aortic valve replacement using bioprosthetic tissue valve.  Plan:  We have not recommended any changes the patient's current medications.  The patient has been encouraged to continue to make room for regular exercise and her lifestyle.  The many benefits of regular exercise and weight loss have been emphasized.  The patient has been reminded regarding the importance of dental hygiene and the lifelong need for antibiotic prophylaxis for all dental cleanings and other related invasive procedures.  In the future she will call return to see [place] only should specific problems or questions arise.   I spent in excess of 15 minutes during the conduct of this office consultation and >50% of this time involved direct face-to-face encounter with the patient for counseling and/or coordination of their care.    Valentina Gu. Roxy Manns, MD 09/14/2017 11:05 AM

## 2017-09-14 NOTE — Patient Instructions (Addendum)
Continue all previous medications without any changes at this time  You may resume unrestricted physical activity without any particular limitations at this time.  Make every effort to stay physically active, get some type of exercise on a regular basis, and stick to a "heart healthy diet".  The long term benefits for regular exercise and a healthy diet are critically important to your overall health and wellbeing.  You have been advised of the numerous benefits associated with regular exercise and weight loss.  The long term benefits for your overall health and wellbeing cannot be overestimated.  Endocarditis is a potentially serious infection of heart valves or inside lining of the heart.  It occurs more commonly in patients with diseased heart valves (such as patient's with aortic or mitral valve disease) and in patients who have undergone heart valve repair or replacement.  Certain surgical and dental procedures may put you at risk, such as dental cleaning, other dental procedures, or any surgery involving the respiratory, urinary, gastrointestinal tract, gallbladder or prostate gland.   To minimize your chances for develooping endocarditis, maintain good oral health and seek prompt medical attention for any infections involving the mouth, teeth, gums, skin or urinary tract.    Always notify your doctor or dentist about your underlying heart valve condition before having any invasive procedures. You will need to take antibiotics before certain procedures, including all routine dental cleanings or other dental procedures.  Your cardiologist or dentist should prescribe these antibiotics for you to be taken ahead of time.

## 2018-08-19 ENCOUNTER — Telehealth: Payer: Self-pay

## 2018-08-19 NOTE — Telephone Encounter (Signed)
   Primary Cardiologist:No primary care provider on file.  Chart reviewed as part of pre-operative protocol coverage. Because of Jasmin Allen's past medical history and time since last visit, he/she will require a follow-up visit in order to better assess preoperative cardiovascular risk.  Pre-op covering staff: - Please schedule appointment and call patient to inform them. - Please contact requesting surgeon's office via preferred method (i.e, phone, fax) to inform them of need for appointment prior to surgery.  If applicable, this message will also be routed to pharmacy pool and/or primary cardiologist for input on holding anticoagulant/antiplatelet agent as requested below so that this information is available at time of patient's appointment.   Spoke to the patient, surgery is not emergent, please contact the patient and arrange 2 month visit with Dr. Blair Heys, PA  08/19/2018, 10:44 AM

## 2018-08-19 NOTE — Telephone Encounter (Signed)
   [facility] Pre-operative Risk Assessment    Request for surgical clearance:  1. What type of surgery is being performed?  RIGHT KNEE TKA   2. When is this surgery scheduled? TBD   3. What type of clearance is required (medical clearance vs. Pharmacy clearance to hold med vs. Both)? MEDICAL  4. Are there any medications that need to be held prior to surgery and how long? NOT LISTED   5. Practice name and name of physician performing surgery? [facility] ATTN:  [place]   6. What is your office phone number  [phone]    7.   What is your office fax number  [phone]  8.   Anesthesia type (None, local, MAC, general) ? NOT LISTED   Waylan Rocher 08/19/2018, 8:40 AM  _________________________________________________________________   (provider comments below)

## 2018-08-19 NOTE — Telephone Encounter (Signed)
Faxed to [facility] @ [email or web address] via [employer].

## 2018-08-19 NOTE — Telephone Encounter (Signed)
Patient scheduled for 10/21/18 at 10:20a w/ Dr Stanford Breed.

## 2018-09-02 IMAGING — CR DG CHEST 2V
2 series · 2 of 2 positions shown · non-contrast
Comparison: 07/09/2016

CLINICAL DATA: Preoperative evaluation for upcoming heart
catheterization, history of aortic stenosis

EXAM:
CHEST  2 VIEW

[w chest pa]
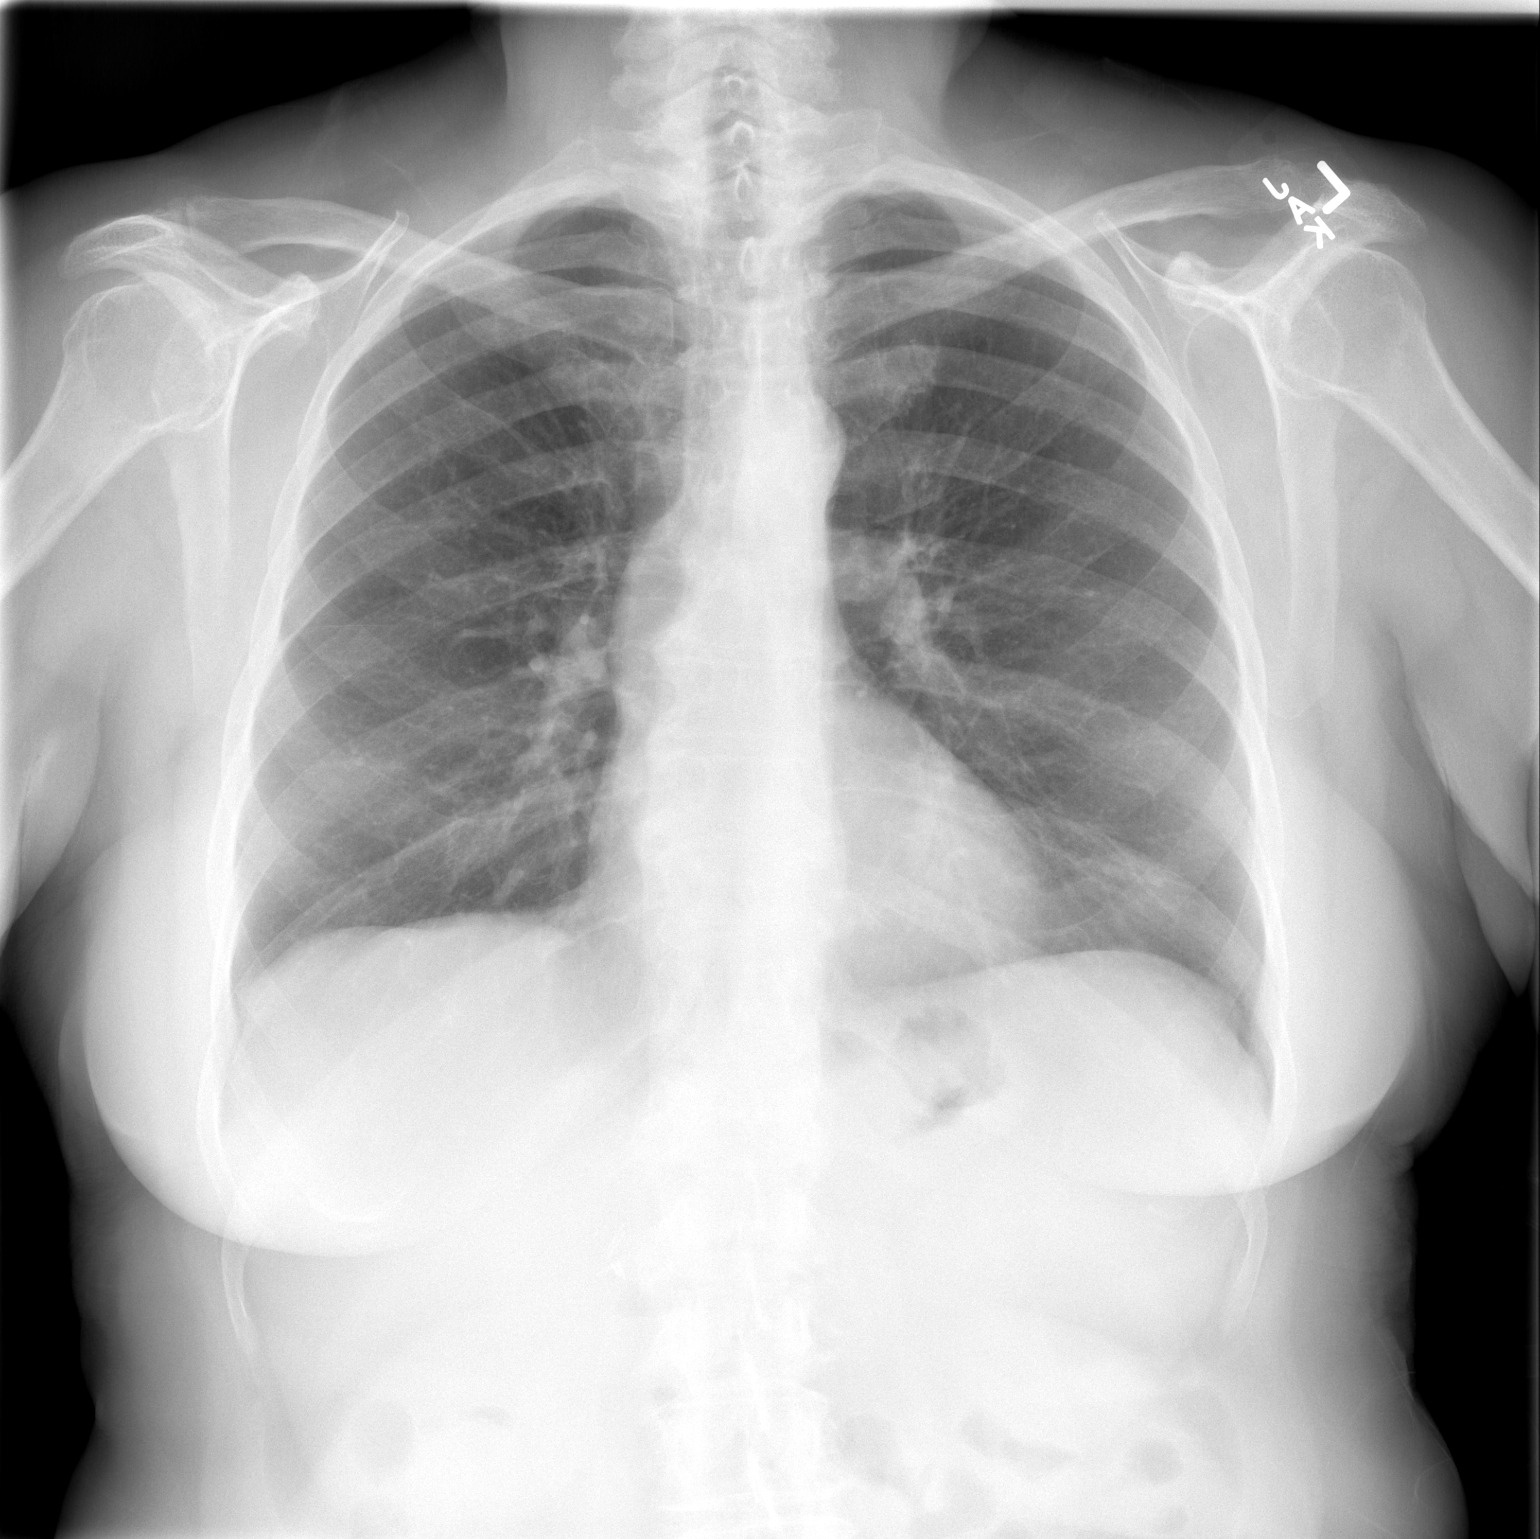

[w chest lat]
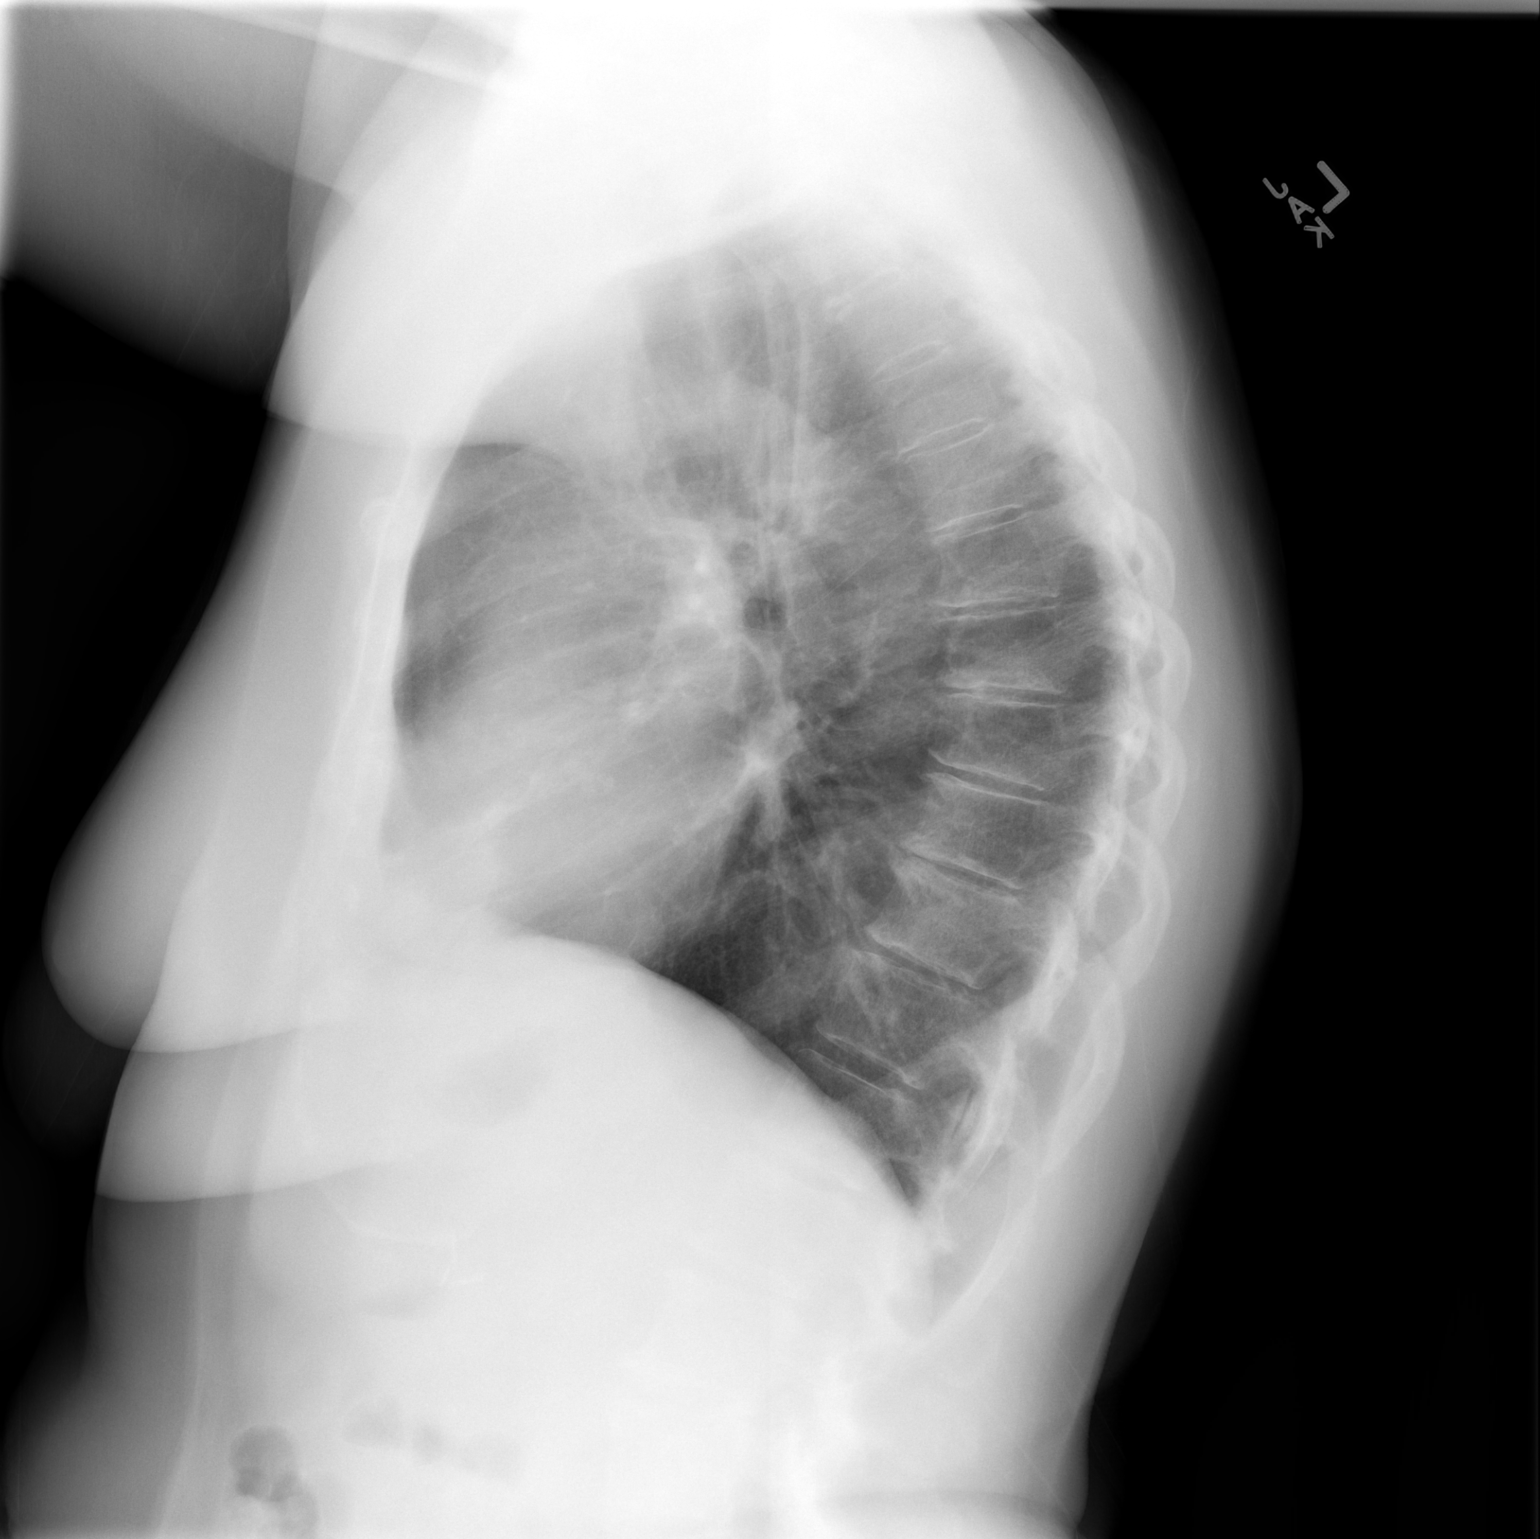

[2 of 2 positions shown; findings below may reference images not displayed]

FINDINGS: The heart size and mediastinal contours are within normal limits.
Both lungs are clear. The visualized skeletal structures are
unremarkable.
IMPRESSION: No active cardiopulmonary disease.

## 2018-10-11 IMAGING — CR DG CHEST 2V
2 series · 2 of 2 positions shown · non-contrast
Comparison: Chest radiograph August 17, 2016

CLINICAL DATA: Preoperative evaluation for aortic valve
replacement.

EXAM:
CHEST  2 VIEW

[w chest lat]
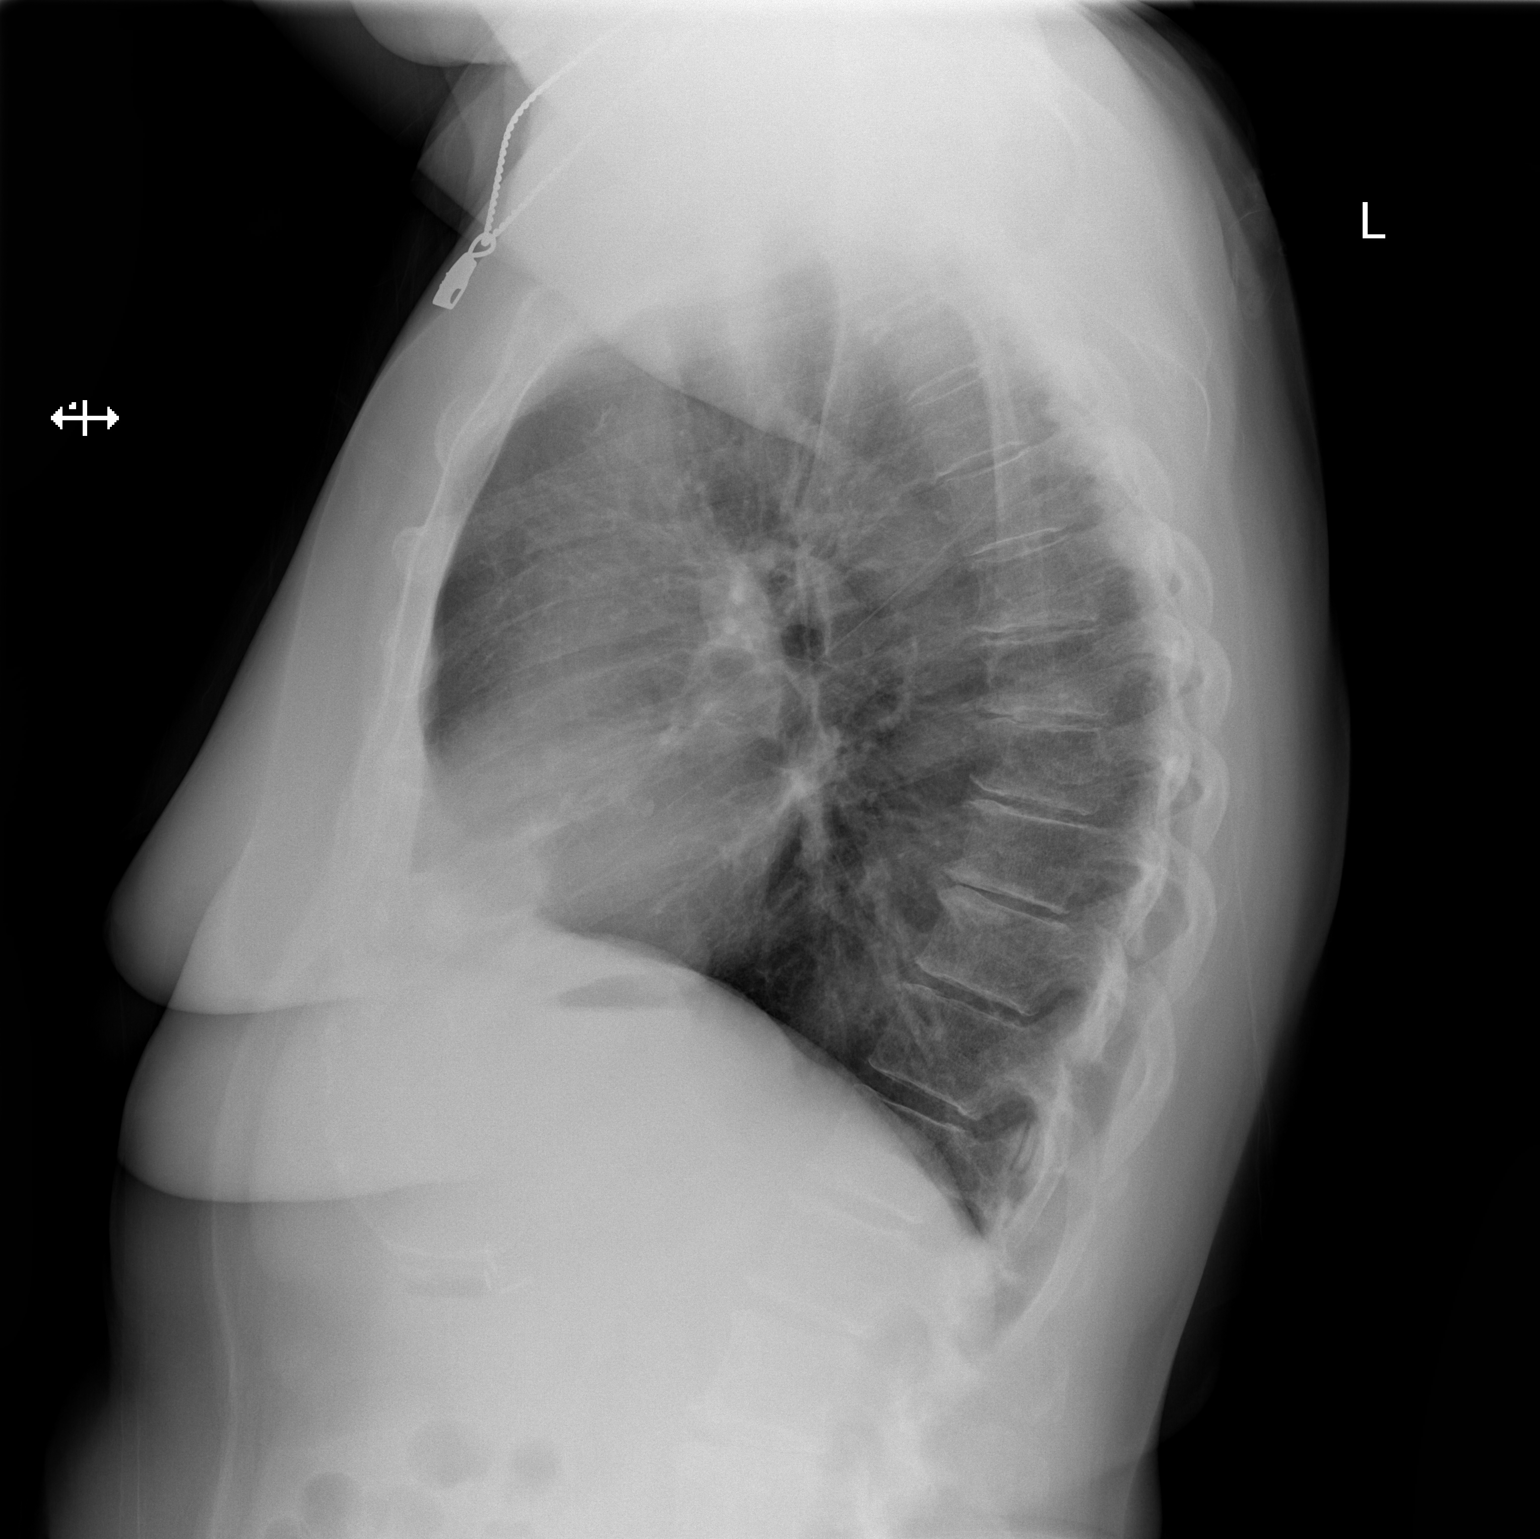

[w chest pa]
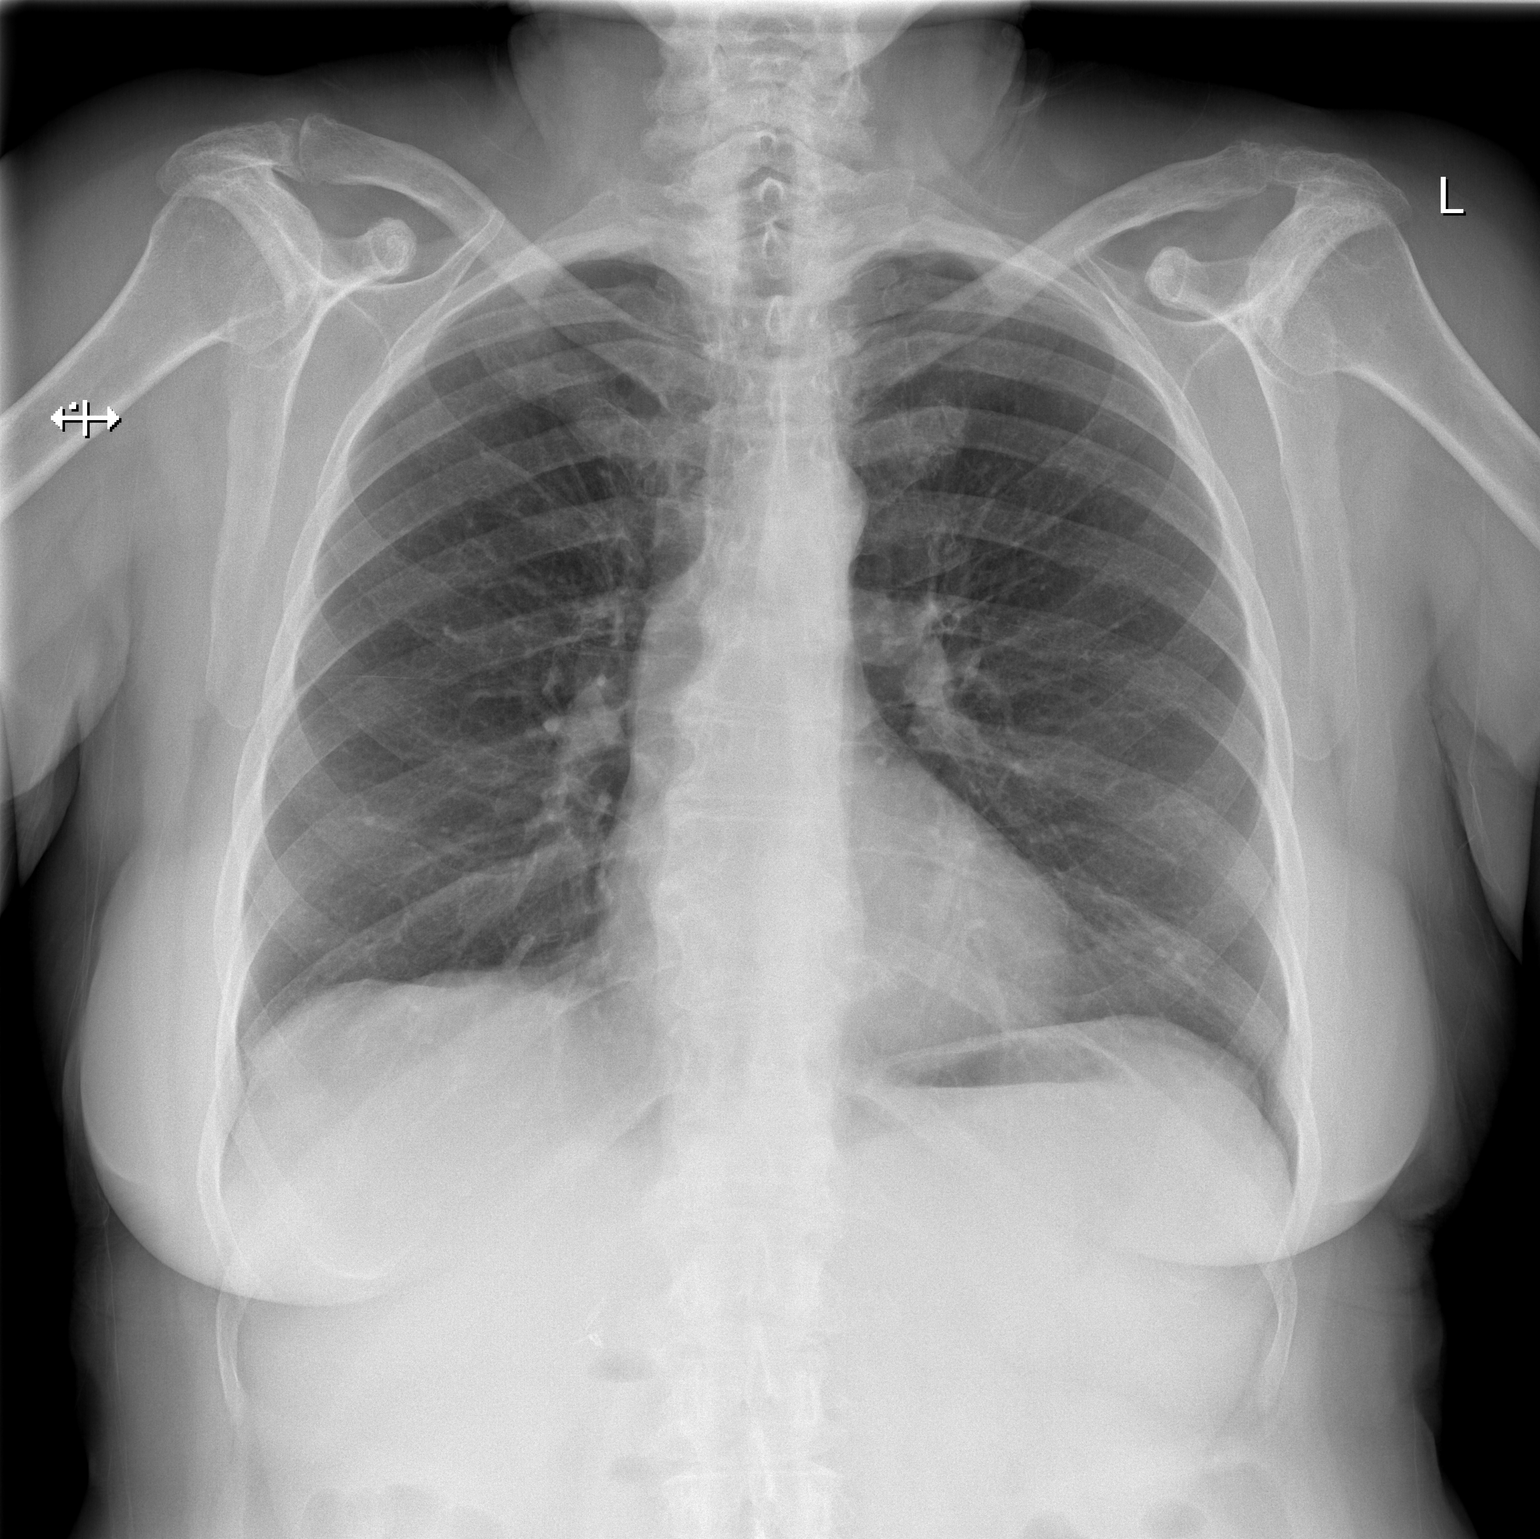

[2 of 2 positions shown; findings below may reference images not displayed]

FINDINGS: Cardiomediastinal silhouette is normal. No pleural effusions or
focal consolidations. Trachea projects midline and there is no
pneumothorax. Soft tissue planes and included osseous structures are
non-suspicious. Surgical clips in the included right abdomen
compatible with cholecystectomy. Mild degenerative change of the
thoracic spine. Moderate cervical facet arthropathy partially
characterized.
IMPRESSION: Stable examination:  No acute cardiopulmonary process.

## 2018-10-13 NOTE — Progress Notes (Signed)
Need surgery orders in epic for 5-29 surgery pre op is 5-18. thanks

## 2018-10-13 NOTE — H&P (View-Only) (Signed)
Need surgery orders in epic for 5-29 surgery pre op is 5-18. thanks

## 2018-10-15 IMAGING — CR DG CHEST 1V PORT
1 series · 1 of 1 positions shown · non-contrast
Comparison: Prior chest x-ray 09/18/2016

CLINICAL DATA: 75-year-old female status post CABG with right-sided
chest tube

EXAM:
PORTABLE CHEST 1 VIEW

[AP]
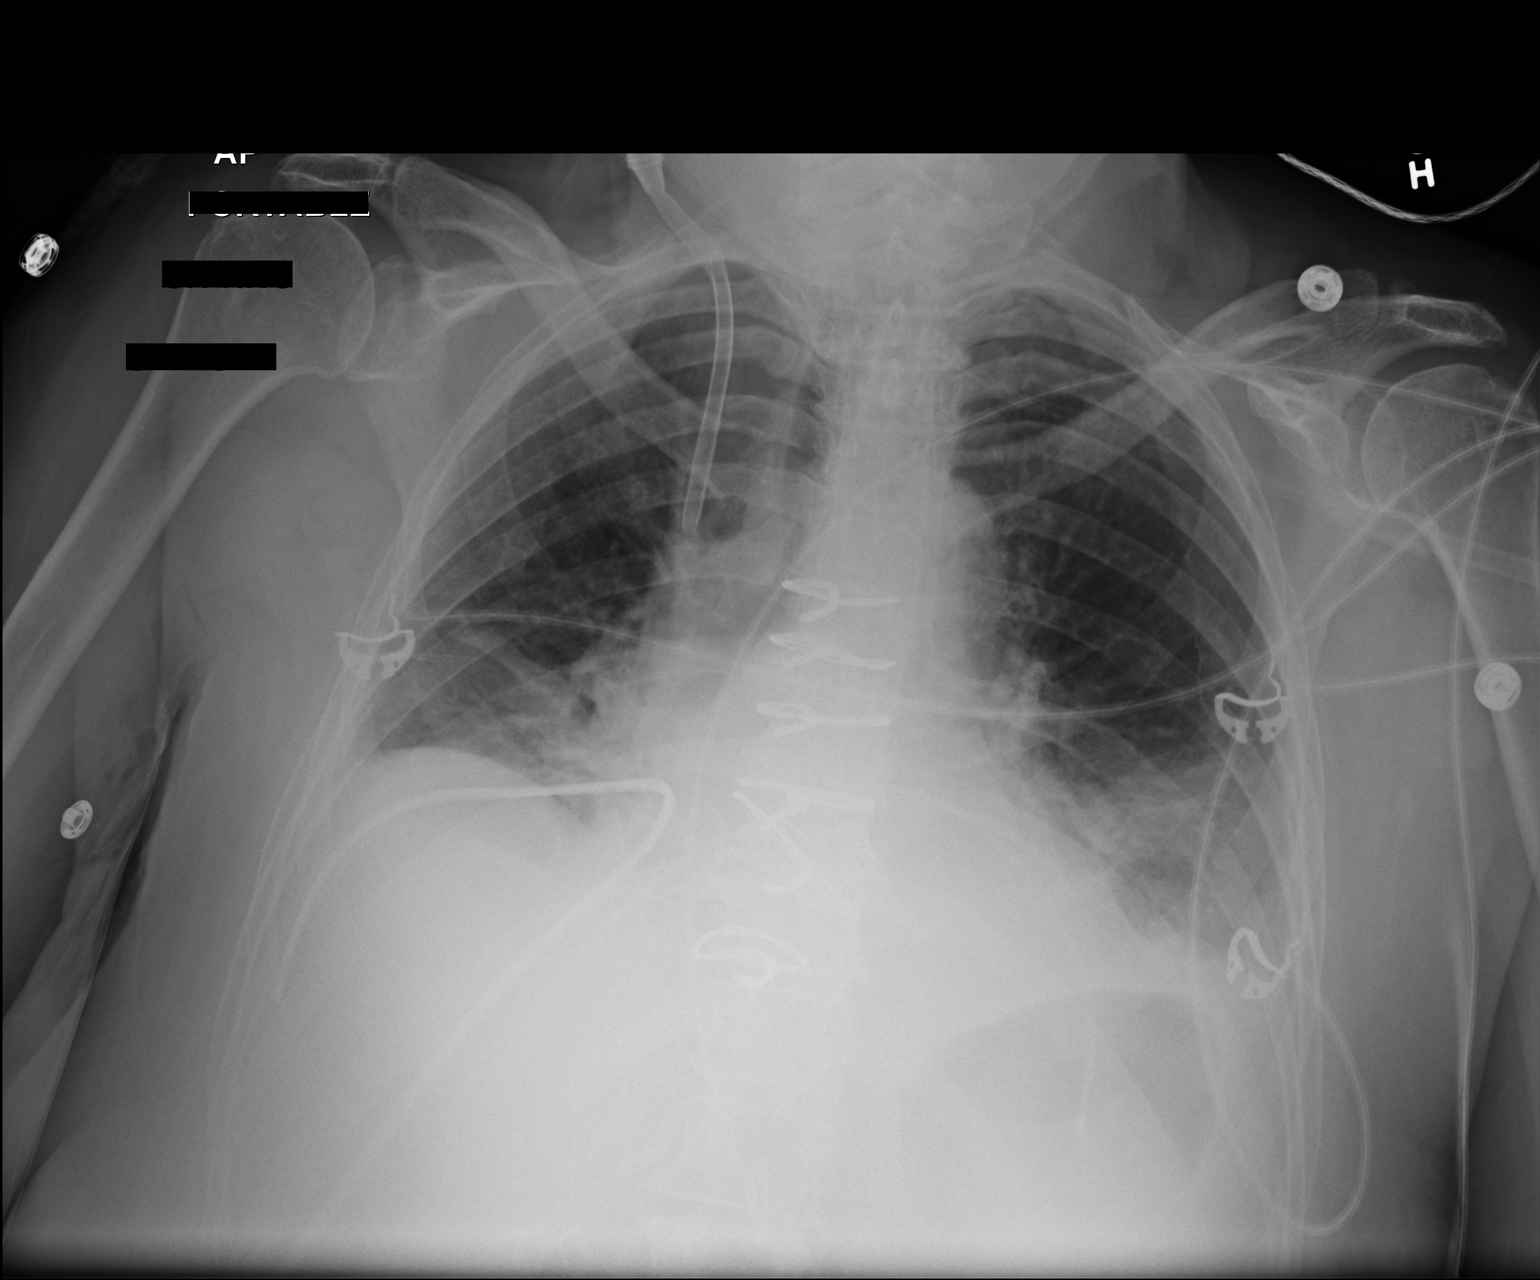

[1 of 1 positions shown; findings below may reference images not displayed]

FINDINGS: Interval removal of Swan-Ganz catheter. A right IJ approach Cordis
sheath remains in place with the tip in the mid SVC. Unchanged
position of right basilar chest tube. No evidence of pneumothorax.
Persistent very low inspiratory volumes with bibasilar atelectasis.
There is likely a small left pleural effusion. No pulmonary edema.
Surgical changes of median sternotomy and aortic valve replacement.
IMPRESSION: 1. Persistent very low inspiratory volumes with bibasilar
atelectasis and likely a small left pleural effusion. No new or
acute cardiopulmonary process.
2. Interval removal of left IJ approach Swan-Ganz catheter.
3. Stable position of right-sided chest tube and right IJ vascular
sheath.

## 2018-10-15 NOTE — Patient Instructions (Addendum)
Jasmin Allen  10/15/2018   YOU ARE REQUIRED TO BE TESTED FOR COVID-19 PRIOR TO YOUR SURGERY . YOUR TEST MUST BE COMPLETED ON Tuesday May, 26 2020. TESTING IS LOCATED AT [place] ENTRANCE FROM 9:00AM - 3:00PM. FAILURE TO COMPLETE TESTING MAY RESULT IN CANCELLATION OF YOUR SURGERY.   _____________________________________________________________________________________    Your procedure is scheduled on: 10-29-2018   Report to [facility] Main  Entrance    Report to [place] at 5:30AM      Call this number if you have problems the morning of surgery [phone]      Remember: Do not eat food or drink liquids :After Midnight. BRUSH YOUR TEETH MORNING OF SURGERY AND RINSE YOUR MOUTH OUT, NO CHEWING GUM CANDY OR MINTS.     Take these medicines the morning of surgery with A SIP OF WATER: carvedilol, allegra, Flonase, omeprazole                                  You may not have any metal on your body including hair pins and              piercings  Do not wear jewelry, make-up, lotions, powders or perfumes, deodorant             Do not wear nail polish.  Do not shave  48 hours prior to surgery.                Do not bring valuables to the hospital. [place].  Contacts, dentures or bridgework may not be worn into surgery.                Please read over the following fact sheets you were given: _____________________________________________________________________             [facility] - Preparing for Surgery Before surgery, you can play an important role.  Because skin is not sterile, your skin needs to be as free of germs as possible.  You can reduce the number of germs on your skin by washing with CHG (chlorahexidine gluconate) soap before surgery.  CHG is an antiseptic cleaner which kills germs and bonds with the skin to continue killing germs even after  washing. Please DO NOT use if you have an allergy to CHG or antibacterial soaps.  If your skin becomes reddened/irritated stop using the CHG and inform your nurse when you arrive at Short Stay. Do not shave (including legs and underarms) for at least 48 hours prior to the first CHG shower.  You may shave your face/neck. Please follow these instructions carefully:  1.  Shower with CHG Soap the night before surgery and the  morning of Surgery.  2.  If you choose to wash your hair, wash your hair first as usual with your  normal  shampoo.  3.  After you shampoo, rinse your hair and body thoroughly to remove the  shampoo.                           4.  Use CHG as you would any other liquid soap.  You can apply chg directly  to the skin and  wash                       Gently with a scrungie or clean washcloth.  5.  Apply the CHG Soap to your body ONLY FROM THE NECK DOWN.   Do not use on face/ open                           Wound or open sores. Avoid contact with eyes, ears mouth and genitals (private parts).                       Wash face,  Genitals (private parts) with your normal soap.             6.  Wash thoroughly, paying special attention to the area where your surgery  will be performed.  7.  Thoroughly rinse your body with warm water from the neck down.  8.  DO NOT shower/wash with your normal soap after using and rinsing off  the CHG Soap.                9.  Pat yourself dry with a clean towel.            10.  Wear clean pajamas.            11.  Place clean sheets on your bed the night of your first shower and do not  sleep with pets. Day of Surgery : Do not apply any lotions/deodorants the morning of surgery.  Please wear clean clothes to the hospital/surgery center.  FAILURE TO FOLLOW THESE INSTRUCTIONS MAY RESULT IN THE CANCELLATION OF YOUR SURGERY PATIENT SIGNATURE_________________________________  NURSE  SIGNATURE__________________________________  ________________________________________________________________________

## 2018-10-15 NOTE — Progress Notes (Signed)
SPOKE W/  [place]     SCREENING SYMPTOMS OF COVID 19:   COUGH--NO  RUNNY NOSE--- NO  SORE THROAT---NO  NASAL CONGESTION----NO  SNEEZING----NO  SHORTNESS OF BREATH---NO  DIFFICULTY BREATHING---NO  TEMP >100.0 -----NO  UNEXPLAINED BODY ACHES------NO  CHILLS -------- NO  HEADACHES ---------NO  LOSS OF SMELL/ TASTE --------NO   HAVE YOU OR ANY FAMILY MEMBER TRAVELLED PAST 14 DAYS OUT OF THE   COUNTY---LIVES IN [place]----LIVES IN [place]NO  HAVE YOU OR ANY FAMILY MEMBER BEEN EXPOSED TO ANYONE WITH COVID 19? NO

## 2018-10-15 NOTE — Progress Notes (Signed)
Echo 2018 epic

## 2018-10-18 ENCOUNTER — Other Ambulatory Visit: Payer: Self-pay

## 2018-10-18 ENCOUNTER — Encounter (HOSPITAL_COMMUNITY): Payer: Self-pay

## 2018-10-18 ENCOUNTER — Encounter (HOSPITAL_COMMUNITY)
Admission: RE | Admit: 2018-10-18 | Discharge: 2018-10-18 | Disposition: A | Discharge status: Home / Self Care | Payer: Medicare Other | Source: Ambulatory Visit | Attending: Specialist | Admitting: Specialist

## 2018-10-18 DIAGNOSIS — Z01818 Encounter for other preprocedural examination: Secondary | ICD-10-CM | POA: Diagnosis present

## 2018-10-18 HISTORY — DX: Unspecified cataract: H26.9

## 2018-10-18 LAB — CBC
HCT: 39.2 % (ref 36.0–46.0)
Hemoglobin: 13.2 g/dL (ref 12.0–15.0)
MCH: 33.9 pg (ref 26.0–34.0)
MCHC: 33.7 g/dL (ref 30.0–36.0)
MCV: 100.8 fL — ABNORMAL HIGH (ref 80.0–100.0)
Platelets: 146 10*3/uL — ABNORMAL LOW (ref 150–400)
RBC: 3.89 MIL/uL (ref 3.87–5.11)
RDW: 13.2 % (ref 11.5–15.5)
WBC: 4.1 10*3/uL (ref 4.0–10.5)
nRBC: 0 % (ref 0.0–0.2)

## 2018-10-18 LAB — BASIC METABOLIC PANEL
Anion gap: 8 (ref 5–15)
BUN: 18 mg/dL (ref 8–23)
CO2: 29 mmol/L (ref 22–32)
Calcium: 9.5 mg/dL (ref 8.9–10.3)
Chloride: 104 mmol/L (ref 98–111)
Creatinine, Ser: 0.93 mg/dL (ref 0.44–1.00)
GFR calc Af Amer: >60 mL/min (ref >60)
GFR calc non Af Amer: 60 mL/min — ABNORMAL LOW (ref >60)
Glucose, Bld: 118 mg/dL — ABNORMAL HIGH (ref 70–99)
Potassium: 5 mmol/L (ref 3.5–5.1)
Sodium: 141 mmol/L (ref 135–145)

## 2018-10-18 LAB — SURGICAL PCR SCREEN
MRSA, PCR: NEGATIVE
Staphylococcus aureus: NEGATIVE

## 2018-10-19 ENCOUNTER — Telehealth: Payer: Self-pay | Admitting: Cardiology

## 2018-10-19 NOTE — Progress Notes (Signed)
Anesthesia Chart Review   Case:  [id number] Date/Time:  10/29/18 0715   Procedure:  TOTAL KNEE ARTHROPLASTY (Right ) - with adductor canal   Anesthesia type:  Spinal   Pre-op diagnosis:  Right knee osteoarthritis   Location:  [facility] ROOM 08 / [facility]   Surgeon:  Sydnee Cabal, MD      DISCUSSION: 78 yo never smoker with h/o PONV, HTN, hyperlipemia, AS s/p AV replacement 4854, chronic diastolic heart failure, right knee OA scheduled for above procedure 10/29/2018 with Dr. Sydnee Cabal.   Pt is scheduled with cardiologist, Dr. Stanford Breed, 10/21/2018 for preoperative evaluation.   Addendum 10/22/2018: Pt seen by Dr. Kirk Ruths 10/21/2018.  Per his note, "Preoperative evaluation prior to knee replacement-patient is scheduled for knee replacement in 1 week.  She has not had chest pain or dyspnea.  She may proceed without further cardiac evaluation." VS: BP (!) 156/80   Pulse 79   Temp 36.8 C (Oral)   Resp 16   Ht 5\' 5"  (1.651 m)   Wt 91.1 kg   SpO2 99%   BMI 33.43 kg/m   PROVIDERS: Lonia Mad, MD is PCP   Kirk Ruths, MD is Cardiologist  LABS: Labs reviewed: Acceptable for surgery. (all labs ordered are listed, but only abnormal results are displayed)  Labs Reviewed  CBC - Abnormal; Notable for the following components:      Result Value   MCV 100.8 (*)    Platelets 146 (*)    All other components within normal limits  BASIC METABOLIC PANEL - Abnormal; Notable for the following components:   Glucose, Bld 118 (*)    GFR calc non Af Amer 60 (*)    All other components within normal limits  SURGICAL PCR SCREEN     IMAGES:   EKG: 10/18/2018 Rate 70 bpm Normal sinus rhythm  No significant change since last tracing   CV: Echo 11/27/2016 Study Conclusions  - Left ventricle: The cavity size was normal. There was mild   concentric hypertrophy. Systolic function was vigorous. The   estimated ejection fraction was in the range of 65% to 70%. Wall   motion was  normal; there were no regional wall motion   abnormalities. Doppler parameters are consistent with abnormal   left ventricular relaxation (grade 1 diastolic dysfunction).   Doppler parameters are consistent with elevated ventricular   end-diastolic filling pressure. - Aortic valve: A bioprosthesis sits well in the aortic position.   Transvalvular velocity was within the normal range. There was no   stenosis. There was no regurgitation. Peak gradient (S): 26 mm   Hg. - Mitral valve: There was no regurgitation. - Left atrium: The atrium was normal in size. - Tricuspid valve: There was mild regurgitation. - Pulmonary arteries: Systolic pressure was within the normal   range.  Impressions:  - S/P AVR with a rapid deployment bioprosthetic tissue valve.   Normal transaortic gradients, no central aortic regurgitation or   paravalvular leak. Past Medical History:  Diagnosis Date  . Aortic stenosis   . Arthritis   . Bicuspid aortic valve   . Cervical lymphadenopathy   . Chronic diastolic congestive heart failure ([place])   . H/O bronchitis 07/2013  . Heart murmur   . Hepatitis    JAUNDICE  1960  . Hyperlipemia   . Hypertension   . Immature cataract of both eyes   . PONV (postoperative nausea and vomiting)    with gallbladder surgery , reports no issues with valve surgery   .  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 ABDOMINAL HYSTERECTOMY  1985  . AORTIC VALVE REPLACEMENT N/A 09/17/2016   Procedure: MINIMALLY INVASIVE AORTIC VALVE REPLACEMENT (AVR) THROUGH PARTIAL STERNOTOMY USING A 21MM EDWARDS INTUITY ELITE VALVE;  Surgeon: Rexene Alberts, MD;  Location: [place];  Service: Open Heart Surgery;  Laterality: N/A;  . [place]   rt hand  . CHOLECYSTECTOMY  01/03/2013    Procedure: LAPAROSCOPIC CHOLECYSTECTOMY attempted cholangiogram;  Surgeon: Ralene Ok, MD;  Location: [place];  Service: General;;  . DILATION AND CURETTAGE OF UTERUS     several  . HAMMER TOE SURGERY  10/01/2011   left toe  . LYMPH NODE BIOPSY  06/14/2012   Procedure: LYMPH NODE BIOPSY;  Surgeon: Haywood Lasso, MD;  Location: [place];  Service: General;  Laterality: Left;  remove left neck lymph node  . TEE WITHOUT CARDIOVERSION N/A 09/17/2016   Procedure: TRANSESOPHAGEAL ECHOCARDIOGRAM (TEE);  Surgeon: Rexene Alberts, MD;  Location: [place];  Service: Open Heart Surgery;  Laterality: N/A;  . TONSILLECTOMY    . TUBAL LIGATION      MEDICATIONS: . clindamycin (CLEOCIN) 300 MG capsule  . ELDERBERRY PO  . acetaminophen (TYLENOL) 650 MG CR tablet  . aspirin EC 81 MG tablet  . B Complex-C (B-COMPLEX WITH VITAMIN C) tablet  . Calcium Carbonate-Vitamin D3 (CALCIUM 600+D3) 600-400 MG-UNIT TABS  . carvedilol (COREG) 6.25 MG tablet  . Cholecalciferol (VITAMIN D) 2000 UNITS tablet  . Coenzyme Q10 (CO Q-10) 200 MG CAPS  . fexofenadine (ALLEGRA) 180 MG tablet  . fluticasone (FLONASE) 50 MCG/ACT nasal spray  . folic acid (FOLVITE) 741 MCG tablet  . furosemide (LASIX) 20 MG tablet  . loratadine (CLARITIN) 10 MG tablet  . losartan (COZAAR) 100 MG tablet  . omeprazole (PRILOSEC) 20 MG capsule  . pravastatin (PRAVACHOL) 20 MG tablet  . vitamin B-12 (CYANOCOBALAMIN) 1000 MCG tablet   No current facility-administered medications for this encounter.     Maia Plan [facility] Pre-Surgical Testing [phone] 10/22/18 11:02 AM

## 2018-10-19 NOTE — Progress Notes (Signed)
Virtual Visit via Video Note   This visit type was conducted due to national recommendations for restrictions regarding the COVID-19 Pandemic (e.g. social distancing) in an effort to limit this patient's exposure and mitigate transmission in our community.  Due to her co-morbid illnesses, this patient is at least at moderate risk for complications without adequate follow up.  This format is felt to be most appropriate for this patient at this time.  All issues noted in this document were discussed and addressed.  A limited physical exam was performed with this format.  Please refer to the patient's chart for her consent to telehealth for [facility].   Date:  10/21/2018   ID:  Jasmin Allen, DOB 05-Mar-1941, MRN [id number]  Patient Location: Home Provider Location: Home  PCP:  Lonia Mad, MD  Cardiologist:  Dr Stanford Breed  Evaluation Performed:  Follow-Up Visit  Chief Complaint:  FU AVR  History of Present Illness:    FU bicuspid aortic valve and severe aortic stenosis.Echo 06/12/16 showednormal LV systolic function, mild left atrial enlargement, mild mitral regurgitation, bicuspid aortic valve with trace aortic insufficiency and severe aortic stenosis with mean gradient 43 mmHg. CTA 2/18 showed no TAA; LLL pulmonary nodule; fu 12 months. Patient had cardiac catheterization 08/14/2016 showed no coronary disease. Carotid Dopplers April 2018 showed no significant stenosis. Patient had aortic valve replacement with a pericardial tissue valve in April 2018.  Postoperative echocardiogram June 2018 showed vigorous LV function, mild diastolic dysfunction, bioprosthetic aortic valve with no aortic stenosis or aortic insufficiency and mild tricuspid regurgitation.  Follow-up chest CT January 2019 showed no nodule.  Since last seen, pt denies chest pain, palpitations, dyspnea or syncope.  The patient does not have symptoms concerning for COVID-19 infection (fever, chills, cough, or new shortness  of breath).    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   21 mm Edwards Intuity rapid deployment bovine pericardial tissue valve via partial upper mini sternotomy approach   Past Surgical History:  Procedure Laterality Date   ABDOMINAL HYSTERECTOMY  1985   AORTIC VALVE REPLACEMENT N/A 09/17/2016   Procedure: MINIMALLY INVASIVE AORTIC VALVE REPLACEMENT (AVR) THROUGH PARTIAL STERNOTOMY USING A 21MM EDWARDS INTUITY ELITE VALVE;  Surgeon: Rexene Alberts, MD;  Location: [place];  Service: Open Heart Surgery;  Laterality: N/A;   CARPAL TUNNEL RELEASE  1985   rt hand   CHOLECYSTECTOMY  01/03/2013   Procedure: LAPAROSCOPIC CHOLECYSTECTOMY attempted cholangiogram;  Surgeon: Ralene Ok, MD;  Location: [place];  Service: General;;   DILATION AND CURETTAGE OF UTERUS     several   HAMMER TOE SURGERY  10/01/2011   left toe   LYMPH NODE BIOPSY  06/14/2012   Procedure: LYMPH NODE BIOPSY;  Surgeon: Haywood Lasso, MD;  Location: [place];  Service: General;  Laterality: Left;  remove left neck lymph node   TEE WITHOUT CARDIOVERSION N/A 09/17/2016   Procedure: TRANSESOPHAGEAL ECHOCARDIOGRAM (TEE);  Surgeon:  Rexene Alberts, MD;  Location: [place];  Service: Open Heart Surgery;  Laterality: N/A;   TONSILLECTOMY     TUBAL LIGATION       Current Meds  Medication Sig   acetaminophen (TYLENOL) 650 MG CR tablet Take 650 mg by mouth every 8 (eight) hours as needed for pain. Has stopped prior to procedure   aspirin EC  81 MG tablet Take 81 mg by mouth daily.   B Complex-C (B-COMPLEX WITH VITAMIN C) tablet Take 1 tablet by mouth every evening.   Calcium Carbonate-Vitamin D3 (CALCIUM 600+D3) 600-400 MG-UNIT TABS Take 1 tablet by mouth at bedtime.   carvedilol (COREG) 6.25 MG tablet Take 6.25 mg by mouth 2 (two) times daily with a meal.    Cholecalciferol (VITAMIN D) 2000 UNITS tablet Take 2,000 Units by mouth at bedtime.    clindamycin (CLEOCIN) 300 MG capsule Take 300 mg by mouth. Take 2 capsules by mouth 1 hours prior to dental appointment   Coenzyme Q10 (CO Q-10) 200 MG CAPS Take 200 mg by mouth at bedtime.    ELDERBERRY PO Place under the tongue daily.   fexofenadine (ALLEGRA) 180 MG tablet Take 180 mg by mouth daily as needed for allergies or rhinitis.   fluticasone (FLONASE) 50 MCG/ACT nasal spray INSTILL 2 SPRAYS IN EACH NOSTRIL EVERY DAY (Patient taking differently: Place 1 spray into both nostrils daily as needed for allergies. )   folic acid (FOLVITE) 856 MCG tablet Take 400 mcg by mouth at bedtime.    furosemide (LASIX) 20 MG tablet Take 1 tablet (20 mg total) by mouth daily.   loratadine (CLARITIN) 10 MG tablet Take 10 mg by mouth daily as needed for allergies.   losartan (COZAAR) 100 MG tablet Take 100 mg by mouth daily.   omeprazole (PRILOSEC) 20 MG capsule Take 20 mg by mouth daily as needed (acid reflux).   pravastatin (PRAVACHOL) 20 MG tablet Take 20 mg by mouth at bedtime.    vitamin B-12 (CYANOCOBALAMIN) 1000 MCG tablet Take 2,000 mcg by mouth every evening.      Allergies:   Penicillins and Sulfa antibiotics   Social History   Tobacco Use   Smoking status: Never Smoker   Smokeless tobacco: Never Used  Substance Use Topics   Alcohol use: Yes    Comment: occ   Drug use: No     Family Hx: The patient's family history includes Aortic stenosis in her mother; COPD in her father; Hypertension in her mother; Stroke in her maternal grandfather.  ROS:   Please see the  history of present illness.    No fevers, chills or productive cough.  Knee pain noted. All other systems reviewed and are negative.   Recent Labs: 10/18/2018: BUN 18; Creatinine, Ser 0.93; Hemoglobin 13.2; Platelets 146; Potassium 5.0; Sodium 141    Wt Readings from Last 3 Encounters:  10/21/18 199 lb (90.3 kg)  10/18/18 200 lb 14.4 oz (91.1 kg)  09/14/17 202 lb 9.6 oz (91.9 kg)     Objective:    Vital Signs:  BP (!) 154/78    Pulse 71    Ht 5\' 5"  (1.651 m)    Wt 199 lb (90.3 kg)    SpO2 99%    BMI 33.12 kg/m    VITAL SIGNS:  reviewed No acute distress Answers questions appropriately Normal affect Remainder of physical examination not performed (telehealth visit; coronavirus pandemic)  ASSESSMENT & PLAN:    1. Status post aortic valve replacement-continue SBE prophylaxis.  Patient doing well with no dyspnea. 2. Hypertension-patient's blood pressure is elevated; however typically controlled.  We will continue present regimen and follow. 3. Palpitations-continue beta-blocker. 4. Hyperlipidemia-continue statin. 5. Preoperative evaluation prior to knee replacement-patient is scheduled for knee replacement in 1 week.  She has not had chest pain or dyspnea.  She may proceed without further cardiac evaluation.  COVID-19 Education: The importance of social distancing was discussed today.  Time:   Today, I have spent 15 minutes with the patient with telehealth technology discussing the above problems.     Medication Adjustments/Labs and Tests Ordered: Current medicines are reviewed at length with the patient today.  Concerns regarding medicines are outlined above.   Tests Ordered: No orders of the defined types were placed in this encounter.   Medication Changes: No orders of the defined types were placed in this encounter.   Disposition:  Follow up in 1 year(s)  Signed, Kirk Ruths, MD  10/21/2018 10:07 AM    [facility]

## 2018-10-20 ENCOUNTER — Ambulatory Visit: Payer: Self-pay | Admitting: Orthopedic Surgery

## 2018-10-21 ENCOUNTER — Telehealth (INDEPENDENT_AMBULATORY_CARE_PROVIDER_SITE_OTHER): Payer: Medicare Other | Admitting: Cardiology

## 2018-10-21 ENCOUNTER — Encounter: Payer: Self-pay | Admitting: Cardiology

## 2018-10-21 ENCOUNTER — Telehealth: Payer: Self-pay

## 2018-10-21 VITALS — BP 154/78 | HR 71 | Ht 65.0 in | Wt 199.0 lb

## 2018-10-21 DIAGNOSIS — I10 Essential (primary) hypertension: Secondary | ICD-10-CM

## 2018-10-21 DIAGNOSIS — Z952 Presence of prosthetic heart valve: Secondary | ICD-10-CM

## 2018-10-21 DIAGNOSIS — Z01810 Encounter for preprocedural cardiovascular examination: Secondary | ICD-10-CM

## 2018-10-21 DIAGNOSIS — E7800 Pure hypercholesterolemia, unspecified: Secondary | ICD-10-CM

## 2018-10-21 NOTE — Patient Instructions (Signed)

## 2018-10-21 NOTE — Telephone Encounter (Signed)
Virtual Visit Pre-Appointment Phone Call  "(Jasmin Allen), I am calling you today to discuss your upcoming appointment. We are currently trying to limit exposure to the virus that causes COVID-19 by seeing patients at home rather than in the office."  1. "What is the BEST phone number to call the day of the visit?" - [phone]  2. "Do you have or have access to (through a family member/friend) a smartphone with video capability that we can use for your visit?" a. If yes - list this number in appt notes as "cell" (if different from BEST phone #) and list the appointment type as a VIDEO visit in appointment notes  3. Confirm consent - "In the setting of the current Covid19 crisis, you are scheduled for a ( video) visit with your provider on (May 21) at (10:20am).  Just as we do with many in-office visits, in order for you to participate in this visit, we must obtain consent.  If you'd like, I can send this to your mychart (if signed up) or email for you to review.  Otherwise, I can obtain your verbal consent now.  All virtual visits are billed to your insurance company just like a normal visit would be.  By agreeing to a virtual visit, we'd like you to understand that the technology does not allow for your provider to perform an examination, and thus may limit your provider's ability to fully assess your condition. If your provider identifies any concerns that need to be evaluated in person, we will make arrangements to do so.  Finally, though the technology is pretty good, we cannot assure that it will always work on either your or our end, and in the setting of a video visit, we may have to convert it to a phone-only visit.  In either situation, we cannot ensure that we have a secure connection.  Are you willing to proceed?" STAFF: Did the patient verbally acknowledge consent to telehealth visit? Document YES/NO here: YES  4. Advise patient to be prepared - "Two hours prior to your appointment,  go ahead and check your blood pressure, pulse, oxygen saturation, and your weight (if you have the equipment to check those) and write them all down. When your visit starts, your provider will ask you for this information. If you have an Apple Watch or Kardia device, please plan to have heart rate information ready on the day of your appointment. Please have a pen and paper handy nearby the day of the visit as well."  5. Give patient instructions for MyChart download to smartphone OR Doximity/Doxy.me as below if video visit (depending on what platform provider is using)  6. Inform patient they will receive a phone call 15 minutes prior to their appointment time (may be from unknown caller ID) so they should be prepared to answer    TELEPHONE CALL NOTE  Jasmin Allen has been deemed a candidate for a follow-up tele-health visit to limit community exposure during the Covid-19 pandemic. I spoke with the patient via phone to ensure availability of phone/video source, confirm preferred email & phone number, and discuss instructions and expectations.  I reminded Jasmin Allen to be prepared with any vital sign and/or heart rhythm information that could potentially be obtained via home monitoring, at the time of her visit. I reminded Jasmin Allen to expect a phone call prior to her visit.  Rhetta Mura, [place] 10/21/2018 9:25 AM   INSTRUCTIONS FOR DOWNLOADING THE MYCHART APP  TO SMARTPHONE  - The patient must first make sure to have activated MyChart and know their login information - If Apple, go to [employer] and type in MyChart in the search bar and download the app. If Android, ask patient to go to [employer] and type in [place] in the search bar and download the app. The app is free but as with any other app downloads, their phone may require them to verify saved payment information or Apple/Android password.  - The patient will need to then log into the app with their MyChart  username and password, and select [facility] as their healthcare provider to link the account. When it is time for your visit, go to the MyChart app, find appointments, and click Begin Video Visit. Be sure to Select Allow for your device to access the Microphone and Camera for your visit. You will then be connected, and your provider will be with you shortly.  **If they have any issues connecting, or need assistance please contact MyChart service desk [phone])**  **If using a computer, in order to ensure the best quality for their visit they will need to use either of the following Internet Browsers: [employer], or Google Chrome**  IF USING DOXIMITY or DOXY.ME - The patient will receive a link just prior to their visit by text.     FULL LENGTH CONSENT FOR TELE-HEALTH VISIT   I hereby voluntarily request, consent and authorize [facility] and its employed or contracted physicians, physician assistants, nurse practitioners or other licensed health care professionals (the Practitioner), to provide me with telemedicine health care services (the "Services") as deemed necessary by the treating Practitioner. I acknowledge and consent to receive the Services by the Practitioner via telemedicine. I understand that the telemedicine visit will involve communicating with the Practitioner through live audiovisual communication technology and the disclosure of certain medical information by electronic transmission. I acknowledge that I have been given the opportunity to request an in-person assessment or other available alternative prior to the telemedicine visit and am voluntarily participating in the telemedicine visit.  I understand that I have the right to withhold or withdraw my consent to the use of telemedicine in the course of my care at any time, without affecting my right to future care or treatment, and that the Practitioner or I may terminate the telemedicine visit at any  time. I understand that I have the right to inspect all information obtained and/or recorded in the course of the telemedicine visit and may receive copies of available information for a reasonable fee.  I understand that some of the potential risks of receiving the Services via telemedicine include:  Marland Kitchen Delay or interruption in medical evaluation due to technological equipment failure or disruption; . Information transmitted may not be sufficient (e.g. poor resolution of images) to allow for appropriate medical decision making by the Practitioner; and/or  . In rare instances, security protocols could fail, causing a breach of personal health information.  Furthermore, I acknowledge that it is my responsibility to provide information about my medical history, conditions and care that is complete and accurate to the best of my ability. I acknowledge that Practitioner's advice, recommendations, and/or decision may be based on factors not within their control, such as incomplete or inaccurate data provided by me or distortions of diagnostic images or specimens that may result from electronic transmissions. I understand that the practice of medicine is not an exact science and that Practitioner makes no warranties  or guarantees regarding treatment outcomes. I acknowledge that I will receive a copy of this consent concurrently upon execution via email to the email address I last provided but may also request a printed copy by calling the office of [facility].    I understand that my insurance will be billed for this visit.   I have read or had this consent read to me. . I understand the contents of this consent, which adequately explains the benefits and risks of the Services being provided via telemedicine.  . I have been provided ample opportunity to ask questions regarding this consent and the Services and have had my questions answered to my satisfaction. . I give my informed consent for the services  to be provided through the use of telemedicine in my medical care  By participating in this telemedicine visit I agree to the above.

## 2018-10-22 NOTE — H&P (Signed)
TOTAL KNEE ADMISSION H&P  Patient is being admitted for right total knee arthroplasty.  Subjective:  Chief Complaint:right knee pain.  HPI: Jasmin Allen, 78 y.o. female, has a history of pain and functional disability in the right knee due to arthritis and has failed non-surgical conservative treatments for greater than 12 weeks to includeNSAID's and/or analgesics, corticosteriod injections, viscosupplementation injections and activity modification.  Onset of symptoms was gradual, starting 4 years ago with gradually worsening course since that time. The patient noted prior procedures on the knee to include  arthroscopy on the right knee(s).  Patient currently rates pain in the right knee(s) at 8 out of 10 with activity. Patient has worsening of pain with activity and weight bearing and pain with passive range of motion.  Patient has evidence of subchondral sclerosis, periarticular osteophytes and joint space narrowing by imaging studies. There is no active infection.  Patient Active Problem List   Diagnosis Date Noted  . S/P aortic valve replacement with bioprosthetic valve 09/17/2016  . S/P AVR (aortic valve replacement) 09/17/2016  . Essential hypertension   . Hyperlipidemia   . Chronic diastolic congestive heart failure ([place])   . Anemia 03/02/2016  . Lymphadenopathy of left cervical region 04/07/2012   Past Medical History:  Diagnosis Date  . Aortic stenosis   . Arthritis   . Bicuspid aortic valve   . Cervical lymphadenopathy   . Chronic diastolic congestive heart failure ([place])   . H/O bronchitis 07/2013  . Heart murmur   . Hepatitis    JAUNDICE  1960  . Hyperlipemia   . Hypertension   . Immature cataract of both eyes   . PONV (postoperative nausea and vomiting)    with gallbladder surgery , reports no issues with valve surgery   . S/P aortic valve replacement with bioprosthetic valve 09/17/2016   21 mm Edwards Intuity rapid deployment bovine pericardial tissue valve via  partial upper mini sternotomy approach    Past Surgical History:  Procedure Laterality Date  . ABDOMINAL HYSTERECTOMY  1985  . AORTIC VALVE REPLACEMENT N/A 09/17/2016   Procedure: MINIMALLY INVASIVE AORTIC VALVE REPLACEMENT (AVR) THROUGH PARTIAL STERNOTOMY USING A 21MM EDWARDS INTUITY ELITE VALVE;  Surgeon: Rexene Alberts, MD;  Location: [facility];  Service: Open Heart Surgery;  Laterality: N/A;  . [place]   rt hand  . CHOLECYSTECTOMY  01/03/2013   Procedure: LAPAROSCOPIC CHOLECYSTECTOMY attempted cholangiogram;  Surgeon: Ralene Ok, MD;  Location: [facility];  Service: General;;  . DILATION AND CURETTAGE OF UTERUS     several  . HAMMER TOE SURGERY  10/01/2011   left toe  . LYMPH NODE BIOPSY  06/14/2012   Procedure: LYMPH NODE BIOPSY;  Surgeon: Haywood Lasso, MD;  Location: [facility];  Service: General;  Laterality: Left;  remove left neck lymph node  . TEE WITHOUT CARDIOVERSION N/A 09/17/2016   Procedure: TRANSESOPHAGEAL ECHOCARDIOGRAM (TEE);  Surgeon: Rexene Alberts, MD;  Location: [facility];  Service: Open Heart Surgery;  Laterality: N/A;  . TONSILLECTOMY    . TUBAL LIGATION      No current facility-administered medications for this encounter.    Current Outpatient Medications  Medication Sig Dispense Refill Last Dose  . acetaminophen (TYLENOL) 650 MG CR tablet Take 650 mg by mouth every 8 (eight) hours as needed for pain. Has stopped prior to procedure   Taking  . aspirin EC 81 MG tablet Take 81 mg by mouth daily.   Taking  .  B Complex-C (B-COMPLEX WITH VITAMIN C) tablet Take 1 tablet by mouth every evening.   Taking  . Calcium Carbonate-Vitamin D3 (CALCIUM 600+D3) 600-400 MG-UNIT TABS Take 1 tablet by mouth at bedtime.   Taking  . carvedilol (COREG) 6.25 MG tablet Take 6.25 mg by mouth 2 (two) times daily with a meal.    Taking  . Cholecalciferol (VITAMIN D) 2000 UNITS tablet Take 2,000 Units by mouth at bedtime.    Taking  . Coenzyme Q10 (CO Q-10)  200 MG CAPS Take 200 mg by mouth at bedtime.    Taking  . fexofenadine (ALLEGRA) 180 MG tablet Take 180 mg by mouth daily as needed for allergies or rhinitis.   Taking  . fluticasone (FLONASE) 50 MCG/ACT nasal spray INSTILL 2 SPRAYS IN EACH NOSTRIL EVERY DAY (Patient taking differently: Place 1 spray into both nostrils daily as needed for allergies. ) 48 g 3 Taking  . folic acid (FOLVITE) 086 MCG tablet Take 400 mcg by mouth at bedtime.    Taking  . furosemide (LASIX) 20 MG tablet Take 1 tablet (20 mg total) by mouth daily. 90 tablet 3 Taking  . loratadine (CLARITIN) 10 MG tablet Take 10 mg by mouth daily as needed for allergies.   Taking  . losartan (COZAAR) 100 MG tablet Take 100 mg by mouth daily.   Taking  . omeprazole (PRILOSEC) 20 MG capsule Take 20 mg by mouth daily as needed (acid reflux).   Taking  . pravastatin (PRAVACHOL) 20 MG tablet Take 20 mg by mouth at bedtime.    Taking  . vitamin B-12 (CYANOCOBALAMIN) 1000 MCG tablet Take 2,000 mcg by mouth every evening.    Taking  . clindamycin (CLEOCIN) 300 MG capsule Take 300 mg by mouth. Take 2 capsules by mouth 1 hours prior to dental appointment   Taking  . ELDERBERRY PO Place under the tongue daily.   Taking   Allergies  Allergen Reactions  . Penicillins Swelling    Has patient had a PCN reaction causing immediate rash, facial/tongue/throat swelling, SOB or lightheadedness with hypotension: Unknown Has patient had a PCN reaction causing severe rash involving mucus membranes or skin necrosis: Unknown Has patient had a PCN reaction that required hospitalization No Has patient had a PCN reaction occurring within the last 10 years: No If all of the above answers are "NO", then may proceed with Cephalosporin use.   . Sulfa Antibiotics Hives    Social History   Tobacco Use  . Smoking status: Never Smoker  . Smokeless tobacco: Never Used  Substance Use Topics  . Alcohol use: Yes    Comment: occ    Family History  Problem Relation  Age of Onset  . Hypertension Mother   . Aortic stenosis Mother   . COPD Father   . Stroke Maternal Grandfather      Review of Systems  Constitutional: Negative.   HENT: Negative.   Eyes: Negative.   Respiratory: Negative.   Cardiovascular: Negative.   Skin: Negative for rash.    Objective:  Physical Exam  Constitutional: She is oriented to person, place, and time.  HENT:  Nose: Nose normal.  Cardiovascular: Normal heart sounds.  Respiratory: Effort normal.  GI: Bowel sounds are normal.  Musculoskeletal: Normal range of motion.  Neurological: She is oriented to person, place, and time.  Skin: Skin is warm.  Psychiatric: Judgment normal.    Vital signs in last 24 hours:    Labs:   Estimated body mass index is  33.12 kg/m as calculated from the following:   Height as of 10/21/18: 5\' 5"  (1.651 m).   Weight as of 10/21/18: 90.3 kg.   Imaging Review Plain radiographs demonstrate severe degenerative joint disease of the right knee(s). The overall alignment ismild varus. The bone quality appears to be good for age and reported activity level.      Assessment/Plan:  End stage arthritis, right knee   The patient history, physical examination, clinical judgment of the provider and imaging studies are consistent with end stage degenerative joint disease of the right knee(s) and total knee arthroplasty is deemed medically necessary. The treatment options including medical management, injection therapy arthroscopy and arthroplasty were discussed at length. The risks and benefits of total knee arthroplasty were presented and reviewed. The risks due to aseptic loosening, infection, stiffness, patella tracking problems, thromboembolic complications and other imponderables were discussed. The patient acknowledged the explanation, agreed to proceed with the plan and consent was signed. Patient is being admitted for inpatient treatment for surgery, pain control, PT, OT, prophylactic  antibiotics, VTE prophylaxis, progressive ambulation and ADL's and discharge planning. The patient is planning to be discharged home with home health services    Anticipated LOS equal to or greater than 2 midnights due to - Age 5 and older with one or more of the following:  - Obesity  - Expected need for hospital services (PT, OT, Nursing) required for safe  discharge  - Anticipated need for postoperative skilled nursing care or inpatient rehab  - Active co-morbidities: None OR   - Unanticipated findings during/Post Surgery: None  - Patient is a high risk of re-admission due to: None

## 2018-10-22 NOTE — Anesthesia Preprocedure Evaluation (Addendum)
Anesthesia Evaluation  Patient identified by MRN, date of birth, ID band Patient awake    Reviewed: Allergy & Precautions, NPO status , Patient's Chart, lab work & pertinent test results  Airway Mallampati: II  TM Distance: >3 FB Neck ROM: Full    Dental no notable dental hx.    Pulmonary neg pulmonary ROS,    Pulmonary exam normal breath sounds clear to auscultation       Cardiovascular hypertension, Normal cardiovascular exam Rhythm:Regular Rate:Normal  S/p AVR   Neuro/Psych negative neurological ROS  negative psych ROS   GI/Hepatic negative GI ROS, Neg liver ROS,   Endo/Other  negative endocrine ROS  Renal/GU negative Renal ROS  negative genitourinary   Musculoskeletal negative musculoskeletal ROS (+)   Abdominal   Peds negative pediatric ROS (+)  Hematology negative hematology ROS (+)   Anesthesia Other Findings   Reproductive/Obstetrics negative OB ROS                            Anesthesia Physical Anesthesia Plan  ASA: II  Anesthesia Plan: Spinal   Post-op Pain Management:  Regional for Post-op pain   Induction: Intravenous  PONV Risk Score and Plan: Ondansetron, Dexamethasone and Treatment may vary due to age or medical condition  Airway Management Planned: Simple Face Mask  Additional Equipment:   Intra-op Plan:   Post-operative Plan:   Informed Consent: I have reviewed the patients History and Physical, chart, labs and discussed the procedure including the risks, benefits and alternatives for the proposed anesthesia with the patient or authorized representative who has indicated his/her understanding and acceptance.     Dental advisory given  Plan Discussed with: CRNA and Surgeon  Anesthesia Plan Comments: (See PAT note 10/18/2018, Konrad Felix, PA-C)       Anesthesia Quick Evaluation

## 2018-10-26 ENCOUNTER — Other Ambulatory Visit (HOSPITAL_COMMUNITY)
Admission: RE | Admit: 2018-10-26 | Discharge: 2018-10-26 | Disposition: A | Discharge status: Home / Self Care | Payer: Medicare Other | Source: Ambulatory Visit | Attending: Specialist | Admitting: Specialist

## 2018-10-26 ENCOUNTER — Other Ambulatory Visit: Payer: Self-pay

## 2018-10-26 DIAGNOSIS — Z1159 Encounter for screening for other viral diseases: Secondary | ICD-10-CM | POA: Insufficient documentation

## 2018-10-27 LAB — NOVEL CORONAVIRUS, NAA (HOSP ORDER, SEND-OUT TO REF LAB; TAT 18-24 HRS): SARS-CoV-2, NAA: NOT DETECTED

## 2018-10-28 MED ORDER — BUPIVACAINE LIPOSOME 1.3 % IJ SUSP
20.0000 mL | Freq: Once | INTRAMUSCULAR | Status: DC
  Filled 2018-10-28: qty 20

## 2018-10-28 NOTE — Progress Notes (Signed)
SPOKE W/  _[name]     SCREENING SYMPTOMS OF COVID 19:   COUGH--no  RUNNY NOSE--- no  SORE THROAT---no  NASAL CONGESTION----no  SNEEZING----no  SHORTNESS OF BREATH---no  DIFFICULTY BREATHING---no  TEMP >100.0 -----no  UNEXPLAINED BODY ACHES------no  CHILLS --------no   HEADACHES ---------no  LOSS OF SMELL/ TASTE --------no    HAVE YOU OR ANY FAMILY MEMBER TRAVELLED PAST 14 DAYS OUT OF THE   COUNTY---no STATE----lives in [place].  Travels to [facility] for md appointments COUNTRY----no  HAVE YOU OR ANY FAMILY MEMBER BEEN EXPOSED TO ANYONE WITH COVID 19? no

## 2018-10-29 ENCOUNTER — Encounter (HOSPITAL_COMMUNITY)
Admission: RE | Disposition: A | Discharge status: Home Health | Payer: Self-pay | Source: Home / Self Care | Attending: Specialist

## 2018-10-29 ENCOUNTER — Inpatient Hospital Stay (HOSPITAL_COMMUNITY): Payer: Medicare Other | Admitting: Certified Registered"

## 2018-10-29 ENCOUNTER — Inpatient Hospital Stay (HOSPITAL_COMMUNITY): Payer: Medicare Other | Admitting: Physician Assistant

## 2018-10-29 ENCOUNTER — Other Ambulatory Visit: Payer: Self-pay

## 2018-10-29 ENCOUNTER — Inpatient Hospital Stay (HOSPITAL_COMMUNITY)
Admission: RE | Admit: 2018-10-29 | Discharge: 2018-10-31 | DRG: 470 | Disposition: A | Discharge status: Home Health | Payer: Medicare Other | Attending: Specialist | Admitting: Specialist

## 2018-10-29 ENCOUNTER — Encounter (HOSPITAL_COMMUNITY): Payer: Self-pay | Admitting: *Deleted

## 2018-10-29 DIAGNOSIS — Z7982 Long term (current) use of aspirin: Secondary | ICD-10-CM | POA: Diagnosis not present

## 2018-10-29 DIAGNOSIS — Z96659 Presence of unspecified artificial knee joint: Secondary | ICD-10-CM

## 2018-10-29 DIAGNOSIS — Z880 Allergy status to penicillin: Secondary | ICD-10-CM

## 2018-10-29 DIAGNOSIS — M1711 Unilateral primary osteoarthritis, right knee: Secondary | ICD-10-CM | POA: Diagnosis present

## 2018-10-29 DIAGNOSIS — Z7951 Long term (current) use of inhaled steroids: Secondary | ICD-10-CM

## 2018-10-29 DIAGNOSIS — I110 Hypertensive heart disease with heart failure: Secondary | ICD-10-CM | POA: Diagnosis present

## 2018-10-29 DIAGNOSIS — Z882 Allergy status to sulfonamides status: Secondary | ICD-10-CM | POA: Diagnosis not present

## 2018-10-29 DIAGNOSIS — E785 Hyperlipidemia, unspecified: Secondary | ICD-10-CM | POA: Diagnosis present

## 2018-10-29 DIAGNOSIS — Z952 Presence of prosthetic heart valve: Secondary | ICD-10-CM | POA: Diagnosis not present

## 2018-10-29 DIAGNOSIS — I5032 Chronic diastolic (congestive) heart failure: Secondary | ICD-10-CM | POA: Diagnosis present

## 2018-10-29 DIAGNOSIS — Z79899 Other long term (current) drug therapy: Secondary | ICD-10-CM

## 2018-10-29 DIAGNOSIS — M25761 Osteophyte, right knee: Secondary | ICD-10-CM | POA: Diagnosis present

## 2018-10-29 HISTORY — PX: TOTAL KNEE ARTHROPLASTY: SHX125

## 2018-10-29 LAB — CBC
HCT: 35.5 % — ABNORMAL LOW (ref 36.0–46.0)
Hemoglobin: 12.1 g/dL (ref 12.0–15.0)
MCH: 34.1 pg — ABNORMAL HIGH (ref 26.0–34.0)
MCHC: 34.1 g/dL (ref 30.0–36.0)
MCV: 100 fL (ref 80.0–100.0)
Platelets: 129 10*3/uL — ABNORMAL LOW (ref 150–400)
RBC: 3.55 MIL/uL — ABNORMAL LOW (ref 3.87–5.11)
RDW: 13 % (ref 11.5–15.5)
WBC: 4.2 10*3/uL (ref 4.0–10.5)
nRBC: 0 % (ref 0.0–0.2)

## 2018-10-29 LAB — BASIC METABOLIC PANEL
Anion gap: 8 (ref 5–15)
BUN: 19 mg/dL (ref 8–23)
CO2: 27 mmol/L (ref 22–32)
Calcium: 9.2 mg/dL (ref 8.9–10.3)
Chloride: 104 mmol/L (ref 98–111)
Creatinine, Ser: 1.03 mg/dL — ABNORMAL HIGH (ref 0.44–1.00)
GFR calc Af Amer: >60 mL/min (ref >60)
GFR calc non Af Amer: 53 mL/min — ABNORMAL LOW (ref >60)
Glucose, Bld: 128 mg/dL — ABNORMAL HIGH (ref 70–99)
Potassium: 4 mmol/L (ref 3.5–5.1)
Sodium: 139 mmol/L (ref 135–145)

## 2018-10-29 LAB — PROTIME-INR
INR: 1 (ref 0.8–1.2)
Prothrombin Time: 13.1 seconds (ref 11.4–15.2)

## 2018-10-29 LAB — TYPE AND SCREEN
ABO/RH(D): O POS
Antibody Screen: NEGATIVE

## 2018-10-29 LAB — APTT: aPTT: 29 seconds (ref 24–36)

## 2018-10-29 LAB — ABO/RH: ABO/RH(D): O POS

## 2018-10-29 SURGERY — ARTHROPLASTY, KNEE, TOTAL
Anesthesia: Spinal | Laterality: Right

## 2018-10-29 MED ORDER — PROPOFOL 10 MG/ML IV BOLUS
INTRAVENOUS | Status: AC
  Filled 2018-10-29: qty 60

## 2018-10-29 MED ORDER — LOSARTAN POTASSIUM 50 MG PO TABS
100.0000 mg | ORAL_TABLET | Freq: Every day | ORAL | Status: DC
  Administered 2018-10-29 – 2018-10-31 (×2): 100 mg via ORAL
  Filled 2018-10-29 (×2): qty 2

## 2018-10-29 MED ORDER — STERILE WATER FOR IRRIGATION IR SOLN
Status: DC | PRN
  Administered 2018-10-29: 1000 mL

## 2018-10-29 MED ORDER — MENTHOL 3 MG MT LOZG: 1.0000 | LOZENGE | OROMUCOSAL | Status: DC | PRN

## 2018-10-29 MED ORDER — LIDOCAINE 2% (20 MG/ML) 5 ML SYRINGE
INTRAMUSCULAR | Status: AC
  Filled 2018-10-29: qty 5

## 2018-10-29 MED ORDER — CLONIDINE HCL (ANALGESIA) 100 MCG/ML EP SOLN
EPIDURAL | Status: DC | PRN
  Administered 2018-10-29: 70 ug

## 2018-10-29 MED ORDER — PROMETHAZINE HCL 25 MG/ML IJ SOLN: 6.2500 mg | INTRAMUSCULAR | Status: DC | PRN

## 2018-10-29 MED ORDER — CHLORHEXIDINE GLUCONATE 4 % EX LIQD: 60.0000 mL | Freq: Once | CUTANEOUS | Status: DC

## 2018-10-29 MED ORDER — MIDAZOLAM HCL 2 MG/2ML IJ SOLN
INTRAMUSCULAR | Status: AC
  Filled 2018-10-29: qty 2

## 2018-10-29 MED ORDER — FENTANYL CITRATE (PF) 100 MCG/2ML IJ SOLN
INTRAMUSCULAR | Status: AC
  Filled 2018-10-29: qty 2

## 2018-10-29 MED ORDER — ONDANSETRON HCL 4 MG/2ML IJ SOLN
INTRAMUSCULAR | Status: AC
  Filled 2018-10-29: qty 2

## 2018-10-29 MED ORDER — BUPIVACAINE IN DEXTROSE 0.75-8.25 % IT SOLN
INTRATHECAL | Status: DC | PRN
  Administered 2018-10-29: 1.8 mL via INTRATHECAL

## 2018-10-29 MED ORDER — ONDANSETRON HCL 4 MG/2ML IJ SOLN: 4.0000 mg | Freq: Four times a day (QID) | INTRAMUSCULAR | Status: DC | PRN

## 2018-10-29 MED ORDER — LORATADINE 10 MG PO TABS: 10.0000 mg | ORAL_TABLET | Freq: Every day | ORAL | Status: DC | PRN

## 2018-10-29 MED ORDER — PRAVASTATIN SODIUM 20 MG PO TABS
20.0000 mg | ORAL_TABLET | Freq: Every day | ORAL | Status: DC
  Administered 2018-10-29 – 2018-10-30 (×2): 20 mg via ORAL
  Filled 2018-10-29 (×2): qty 1

## 2018-10-29 MED ORDER — PANTOPRAZOLE SODIUM 40 MG PO TBEC
40.0000 mg | DELAYED_RELEASE_TABLET | Freq: Every day | ORAL | Status: DC
  Administered 2018-10-30: 40 mg via ORAL
  Filled 2018-10-29: qty 1

## 2018-10-29 MED ORDER — DEXAMETHASONE SODIUM PHOSPHATE 10 MG/ML IJ SOLN
INTRAMUSCULAR | Status: AC
  Filled 2018-10-29: qty 1

## 2018-10-29 MED ORDER — ROPIVACAINE HCL 7.5 MG/ML IJ SOLN
INTRAMUSCULAR | Status: DC | PRN
  Administered 2018-10-29: 20 mL via PERINEURAL

## 2018-10-29 MED ORDER — SODIUM CHLORIDE (PF) 0.9 % IJ SOLN
INTRAMUSCULAR | Status: AC
  Filled 2018-10-29: qty 10

## 2018-10-29 MED ORDER — LACTATED RINGERS IV SOLN
INTRAVENOUS | Status: DC
  Administered 2018-10-29: 06:00:00 via INTRAVENOUS

## 2018-10-29 MED ORDER — METHOCARBAMOL 500 MG IVPB - SIMPLE MED
INTRAVENOUS | Status: AC
  Filled 2018-10-29: qty 50

## 2018-10-29 MED ORDER — MAGNESIUM CITRATE PO SOLN: 1.0000 | Freq: Once | ORAL | Status: DC | PRN

## 2018-10-29 MED ORDER — POLYETHYLENE GLYCOL 3350 17 G PO PACK: 17.0000 g | PACK | Freq: Every day | ORAL | Status: DC | PRN

## 2018-10-29 MED ORDER — PROPOFOL 10 MG/ML IV BOLUS
INTRAVENOUS | Status: AC
  Filled 2018-10-29: qty 20

## 2018-10-29 MED ORDER — VANCOMYCIN HCL IN DEXTROSE 1-5 GM/200ML-% IV SOLN
1000.0000 mg | Freq: Two times a day (BID) | INTRAVENOUS | Status: AC
  Administered 2018-10-29: 1000 mg via INTRAVENOUS
  Filled 2018-10-29 (×2): qty 200

## 2018-10-29 MED ORDER — HYDROMORPHONE HCL 1 MG/ML IJ SOLN
0.2500 mg | INTRAMUSCULAR | Status: DC | PRN
  Administered 2018-10-29 (×2): 0.5 mg via INTRAVENOUS

## 2018-10-29 MED ORDER — ACETAMINOPHEN 325 MG PO TABS
325.0000 mg | ORAL_TABLET | Freq: Four times a day (QID) | ORAL | Status: DC | PRN
  Administered 2018-10-30 (×2): 650 mg via ORAL
  Filled 2018-10-29 (×2): qty 2

## 2018-10-29 MED ORDER — DEXAMETHASONE SODIUM PHOSPHATE 10 MG/ML IJ SOLN
10.0000 mg | Freq: Once | INTRAMUSCULAR | Status: AC
  Administered 2018-10-30: 10:00:00 10 mg via INTRAVENOUS
  Filled 2018-10-29: qty 1

## 2018-10-29 MED ORDER — METOCLOPRAMIDE HCL 5 MG/ML IJ SOLN: 5.0000 mg | Freq: Three times a day (TID) | INTRAMUSCULAR | Status: DC | PRN

## 2018-10-29 MED ORDER — EPHEDRINE SULFATE-NACL 50-0.9 MG/10ML-% IV SOSY
PREFILLED_SYRINGE | INTRAVENOUS | Status: DC | PRN
  Administered 2018-10-29 (×2): 5 mg via INTRAVENOUS
  Administered 2018-10-29: 10 mg via INTRAVENOUS

## 2018-10-29 MED ORDER — SODIUM CHLORIDE (PF) 0.9 % IJ SOLN
INTRAMUSCULAR | Status: DC | PRN
  Administered 2018-10-29: 60 mL

## 2018-10-29 MED ORDER — OXYCODONE HCL 5 MG PO TABS
5.0000 mg | ORAL_TABLET | ORAL | Status: DC | PRN
  Administered 2018-10-29: 10 mg via ORAL
  Administered 2018-10-31 (×2): 5 mg via ORAL
  Filled 2018-10-29 (×3): qty 2
  Filled 2018-10-29: qty 1
  Filled 2018-10-29: qty 2
  Filled 2018-10-29: qty 1
  Filled 2018-10-29: qty 2

## 2018-10-29 MED ORDER — OXYCODONE HCL 5 MG PO TABS: 5.0000 mg | ORAL_TABLET | Freq: Once | ORAL | Status: DC | PRN

## 2018-10-29 MED ORDER — POVIDONE-IODINE 10 % EX SWAB: 2.0000 "application " | Freq: Once | CUTANEOUS | Status: DC

## 2018-10-29 MED ORDER — DIPHENHYDRAMINE HCL 12.5 MG/5ML PO ELIX: 12.5000 mg | ORAL_SOLUTION | ORAL | Status: DC | PRN

## 2018-10-29 MED ORDER — BUPIVACAINE LIPOSOME 1.3 % IJ SUSP
INTRAMUSCULAR | Status: DC | PRN
  Administered 2018-10-29: 20 mL

## 2018-10-29 MED ORDER — ASPIRIN EC 325 MG PO TBEC: 325.0000 mg | DELAYED_RELEASE_TABLET | Freq: Two times a day (BID) | ORAL | 0 refills | Status: AC

## 2018-10-29 MED ORDER — ONDANSETRON HCL 4 MG/2ML IJ SOLN
INTRAMUSCULAR | Status: DC | PRN
  Administered 2018-10-29: 4 mg via INTRAVENOUS

## 2018-10-29 MED ORDER — TRANEXAMIC ACID-NACL 1000-0.7 MG/100ML-% IV SOLN
INTRAVENOUS | Status: AC
  Filled 2018-10-29: qty 100

## 2018-10-29 MED ORDER — ONDANSETRON HCL 4 MG PO TABS: 4.0000 mg | ORAL_TABLET | Freq: Four times a day (QID) | ORAL | Status: DC | PRN

## 2018-10-29 MED ORDER — FERROUS SULFATE 325 (65 FE) MG PO TABS
325.0000 mg | ORAL_TABLET | Freq: Three times a day (TID) | ORAL | Status: DC
  Administered 2018-10-29: 325 mg via ORAL
  Filled 2018-10-29 (×2): qty 1

## 2018-10-29 MED ORDER — DOCUSATE SODIUM 100 MG PO CAPS
100.0000 mg | ORAL_CAPSULE | Freq: Two times a day (BID) | ORAL | Status: DC
  Administered 2018-10-29 – 2018-10-30 (×2): 100 mg via ORAL
  Filled 2018-10-29 (×3): qty 1

## 2018-10-29 MED ORDER — METHOCARBAMOL 500 MG IVPB - SIMPLE MED
500.0000 mg | Freq: Four times a day (QID) | INTRAVENOUS | Status: DC | PRN
  Administered 2018-10-29: 11:00:00 500 mg via INTRAVENOUS
  Filled 2018-10-29: qty 50

## 2018-10-29 MED ORDER — VANCOMYCIN HCL IN DEXTROSE 1-5 GM/200ML-% IV SOLN
1000.0000 mg | INTRAVENOUS | Status: AC
  Administered 2018-10-29: 1000 mg via INTRAVENOUS
  Filled 2018-10-29: qty 200

## 2018-10-29 MED ORDER — PROPOFOL 500 MG/50ML IV EMUL
INTRAVENOUS | Status: DC | PRN
  Administered 2018-10-29: 75 ug/kg/min via INTRAVENOUS

## 2018-10-29 MED ORDER — METHOCARBAMOL 500 MG PO TABS
500.0000 mg | ORAL_TABLET | Freq: Four times a day (QID) | ORAL | Status: DC | PRN
  Administered 2018-10-30 (×2): 500 mg via ORAL
  Filled 2018-10-29 (×2): qty 1

## 2018-10-29 MED ORDER — MIDAZOLAM HCL 2 MG/2ML IJ SOLN
INTRAMUSCULAR | Status: DC | PRN
  Administered 2018-10-29 (×2): 1 mg via INTRAVENOUS

## 2018-10-29 MED ORDER — ENOXAPARIN SODIUM 30 MG/0.3ML ~~LOC~~ SOLN
30.0000 mg | Freq: Two times a day (BID) | SUBCUTANEOUS | Status: DC
  Administered 2018-10-30 – 2018-10-31 (×3): 30 mg via SUBCUTANEOUS
  Filled 2018-10-29 (×3): qty 0.3

## 2018-10-29 MED ORDER — BISACODYL 5 MG PO TBEC: 5.0000 mg | DELAYED_RELEASE_TABLET | Freq: Every day | ORAL | Status: DC | PRN

## 2018-10-29 MED ORDER — FUROSEMIDE 20 MG PO TABS
20.0000 mg | ORAL_TABLET | Freq: Every day | ORAL | Status: DC
  Administered 2018-10-29: 20 mg via ORAL
  Filled 2018-10-29 (×2): qty 1

## 2018-10-29 MED ORDER — DEXAMETHASONE SODIUM PHOSPHATE 10 MG/ML IJ SOLN
INTRAMUSCULAR | Status: DC | PRN
  Administered 2018-10-29: 8 mg via INTRAVENOUS

## 2018-10-29 MED ORDER — METOCLOPRAMIDE HCL 5 MG PO TABS: 5.0000 mg | ORAL_TABLET | Freq: Three times a day (TID) | ORAL | Status: DC | PRN

## 2018-10-29 MED ORDER — PHENOL 1.4 % MT LIQD
1.0000 | OROMUCOSAL | Status: DC | PRN
  Filled 2018-10-29: qty 177

## 2018-10-29 MED ORDER — SODIUM CHLORIDE 0.9 % IR SOLN
Status: DC | PRN
  Administered 2018-10-29: 1000 mL

## 2018-10-29 MED ORDER — CARVEDILOL 6.25 MG PO TABS
6.2500 mg | ORAL_TABLET | Freq: Two times a day (BID) | ORAL | Status: DC
  Administered 2018-10-29 – 2018-10-31 (×4): 6.25 mg via ORAL
  Filled 2018-10-29 (×4): qty 1

## 2018-10-29 MED ORDER — SODIUM CHLORIDE 0.9 % IV SOLN
INTRAVENOUS | Status: DC
  Administered 2018-10-29 – 2018-10-30 (×2): via INTRAVENOUS

## 2018-10-29 MED ORDER — SODIUM CHLORIDE (PF) 0.9 % IJ SOLN
INTRAMUSCULAR | Status: AC
  Filled 2018-10-29: qty 50

## 2018-10-29 MED ORDER — OXYCODONE HCL 5 MG/5ML PO SOLN: 5.0000 mg | Freq: Once | ORAL | Status: DC | PRN

## 2018-10-29 MED ORDER — OXYCODONE HCL 5 MG PO TABS
10.0000 mg | ORAL_TABLET | ORAL | Status: DC | PRN
  Administered 2018-10-29 – 2018-10-30 (×4): 10 mg via ORAL

## 2018-10-29 MED ORDER — 0.9 % SODIUM CHLORIDE (POUR BTL) OPTIME
TOPICAL | Status: DC | PRN
  Administered 2018-10-29: 08:00:00 1000 mL

## 2018-10-29 MED ORDER — OXYCODONE HCL 5 MG PO TABS: 5.0000 mg | ORAL_TABLET | ORAL | 0 refills | Status: AC | PRN

## 2018-10-29 MED ORDER — HYDROMORPHONE HCL 1 MG/ML IJ SOLN
INTRAMUSCULAR | Status: AC
  Filled 2018-10-29: qty 1

## 2018-10-29 MED ORDER — ALUM & MAG HYDROXIDE-SIMETH 200-200-20 MG/5ML PO SUSP: 30.0000 mL | ORAL | Status: DC | PRN

## 2018-10-29 MED ORDER — HYDROMORPHONE HCL 1 MG/ML IJ SOLN: 0.5000 mg | INTRAMUSCULAR | Status: DC | PRN

## 2018-10-29 MED ORDER — LOSARTAN POTASSIUM 50 MG PO TABS: 100.0000 mg | ORAL_TABLET | Freq: Every day | ORAL | Status: DC

## 2018-10-29 MED ORDER — ACETAMINOPHEN 500 MG PO TABS
1000.0000 mg | ORAL_TABLET | Freq: Four times a day (QID) | ORAL | Status: AC
  Administered 2018-10-29 – 2018-10-30 (×4): 1000 mg via ORAL
  Filled 2018-10-29 (×4): qty 2

## 2018-10-29 MED ORDER — PROPOFOL 10 MG/ML IV BOLUS
INTRAVENOUS | Status: DC | PRN
  Administered 2018-10-29: 10 mg via INTRAVENOUS

## 2018-10-29 MED ORDER — FENTANYL CITRATE (PF) 100 MCG/2ML IJ SOLN
INTRAMUSCULAR | Status: DC | PRN
  Administered 2018-10-29: 25 ug via INTRAVENOUS
  Administered 2018-10-29: 50 ug via INTRAVENOUS
  Administered 2018-10-29: 25 ug via INTRAVENOUS

## 2018-10-29 MED ORDER — FLUTICASONE PROPIONATE 50 MCG/ACT NA SUSP
1.0000 | Freq: Every day | NASAL | Status: DC | PRN
  Filled 2018-10-29: qty 16

## 2018-10-29 MED ORDER — METHOCARBAMOL 500 MG PO TABS: 500.0000 mg | ORAL_TABLET | Freq: Three times a day (TID) | ORAL | 1 refills | Status: DC | PRN

## 2018-10-29 SURGICAL SUPPLY — 62 items
ATTUNE MED DOME PAT 32 KNEE (Knees) ×2 IMPLANT
ATTUNE MED DOME PAT 32MM KNEE (Knees) ×1 IMPLANT
ATTUNE PSFEM RTSZ4 NARCEM KNEE (Femur) ×3 IMPLANT
ATTUNE PSRP INSR SZ4 6 KNEE (Insert) ×2 IMPLANT
ATTUNE PSRP INSR SZ4 6MM KNEE (Insert) ×1 IMPLANT
BAG DECANTER FOR FLEXI CONT (MISCELLANEOUS) IMPLANT
BAG ZIPLOCK 12X15 (MISCELLANEOUS) ×6 IMPLANT
BANDAGE ACE 4X5 VEL STRL LF (GAUZE/BANDAGES/DRESSINGS) ×3 IMPLANT
BANDAGE ACE 6X5 VEL STRL LF (GAUZE/BANDAGES/DRESSINGS) ×3 IMPLANT
BASE TIBIAL ROT PLAT SZ 5 KNEE (Knees) ×1 IMPLANT
BLADE SAG 18X100X1.27 (BLADE) ×3 IMPLANT
BLADE SAW SGTL 11.0X1.19X90.0M (BLADE) ×3 IMPLANT
BOWL SMART MIX CTS (DISPOSABLE) ×3 IMPLANT
CEMENT HV SMART SET (Cement) ×6 IMPLANT
COVER SURGICAL LIGHT HANDLE (MISCELLANEOUS) ×3 IMPLANT
COVER WAND RF STERILE (DRAPES) IMPLANT
CUFF TOURN SGL QUICK 34 (TOURNIQUET CUFF) ×2
CUFF TRNQT CYL 34X4.125X (TOURNIQUET CUFF) ×1 IMPLANT
DECANTER SPIKE VIAL GLASS SM (MISCELLANEOUS) ×3 IMPLANT
DERMABOND ADVANCED (GAUZE/BANDAGES/DRESSINGS) ×4
DERMABOND ADVANCED .7 DNX12 (GAUZE/BANDAGES/DRESSINGS) ×2 IMPLANT
DRAPE U-SHAPE 47X51 STRL (DRAPES) ×3 IMPLANT
DRSG AQUACEL AG ADV 3.5X10 (GAUZE/BANDAGES/DRESSINGS) ×3 IMPLANT
DRSG TEGADERM 4X4.75 (GAUZE/BANDAGES/DRESSINGS) ×3 IMPLANT
DURAPREP 26ML APPLICATOR (WOUND CARE) ×6 IMPLANT
ELECT REM PT RETURN 15FT ADLT (MISCELLANEOUS) ×3 IMPLANT
EVACUATOR 1/8 PVC DRAIN (DRAIN) ×3 IMPLANT
GAUZE SPONGE 2X2 8PLY STRL LF (GAUZE/BANDAGES/DRESSINGS) ×1 IMPLANT
GLOVE BIO SURGEON STRL SZ7.5 (GLOVE) ×6 IMPLANT
GLOVE BIOGEL PI IND STRL 8 (GLOVE) ×2 IMPLANT
GLOVE BIOGEL PI INDICATOR 8 (GLOVE) ×4
GLOVE ECLIPSE 8.0 STRL XLNG CF (GLOVE) ×6 IMPLANT
GLOVE SURG ORTHO 9.0 STRL STRW (GLOVE) ×3 IMPLANT
GOWN STRL REUS W/TWL XL LVL3 (GOWN DISPOSABLE) ×6 IMPLANT
HANDPIECE INTERPULSE COAX TIP (DISPOSABLE) ×2
HOLDER FOLEY CATH W/STRAP (MISCELLANEOUS) IMPLANT
KIT TURNOVER KIT A (KITS) IMPLANT
NS IRRIG 1000ML POUR BTL (IV SOLUTION) ×3 IMPLANT
PACK TOTAL KNEE CUSTOM (KITS) ×3 IMPLANT
PIN STEINMAN FIXATION KNEE (PIN) ×3 IMPLANT
PROTECTOR NERVE ULNAR (MISCELLANEOUS) ×3 IMPLANT
SET HNDPC FAN SPRY TIP SCT (DISPOSABLE) ×1 IMPLANT
SET PAD KNEE POSITIONER (MISCELLANEOUS) ×3 IMPLANT
SPONGE GAUZE 2X2 STER 10/PKG (GAUZE/BANDAGES/DRESSINGS) ×2
SPONGE LAP 18X18 RF (DISPOSABLE) IMPLANT
SPONGE SURGIFOAM ABS GEL 100 (HEMOSTASIS) ×3 IMPLANT
STOCKINETTE 6  STRL (DRAPES) ×2
STOCKINETTE 6 STRL (DRAPES) ×1 IMPLANT
SUT BONE WAX W31G (SUTURE) IMPLANT
SUT MNCRL AB 3-0 PS2 18 (SUTURE) ×3 IMPLANT
SUT VIC AB 1 CT1 27 (SUTURE) ×6
SUT VIC AB 1 CT1 27XBRD ANTBC (SUTURE) ×3 IMPLANT
SUT VIC AB 2-0 CT1 27 (SUTURE) ×4
SUT VIC AB 2-0 CT1 TAPERPNT 27 (SUTURE) ×2 IMPLANT
SUT VLOC 180 0 24IN GS25 (SUTURE) ×3 IMPLANT
SYR 50ML LL SCALE MARK (SYRINGE) IMPLANT
TAPE STRIPS DRAPE STRL (GAUZE/BANDAGES/DRESSINGS) ×3 IMPLANT
TIBIAL BASE ROT PLAT SZ 5 KNEE (Knees) ×3 IMPLANT
TRAY FOLEY MTR SLVR 16FR STAT (SET/KITS/TRAYS/PACK) ×3 IMPLANT
WATER STERILE IRR 1000ML POUR (IV SOLUTION) ×6 IMPLANT
WRAP KNEE MAXI GEL POST OP (GAUZE/BANDAGES/DRESSINGS) ×3 IMPLANT
YANKAUER SUCT BULB TIP 10FT TU (MISCELLANEOUS) ×3 IMPLANT

## 2018-10-29 NOTE — Anesthesia Procedure Notes (Signed)
Anesthesia Regional Block: Adductor canal block   Pre-Anesthetic Checklist: ,, timeout performed, Correct Patient, Correct Site, Correct Laterality, Correct Procedure, Correct Position, site marked, Risks and benefits discussed,  Surgical consent,  Pre-op evaluation,  At surgeon's request and post-op pain management  Laterality: Right  Prep: chloraprep       Needles:  Injection technique: Single-shot  Needle Type: Echogenic Needle     Needle Length: 9cm      Additional Needles:   Procedures:,,,, ultrasound used (permanent image in chart),,,,  Narrative:  Start time: 10/29/2018 6:58 AM End time: 10/29/2018 7:05 AM Injection made incrementally with aspirations every 5 mL.  Performed by: Personally  Anesthesiologist: Myrtie Soman, MD  Additional Notes: Patient tolerated the procedure well without complications

## 2018-10-29 NOTE — Anesthesia Postprocedure Evaluation (Signed)
Anesthesia Post Note  Patient: Jasmin Allen  Procedure(s) Performed: TOTAL KNEE ARTHROPLASTY (Right )     Patient location during evaluation: PACU Anesthesia Type: Spinal Level of consciousness: oriented and awake and alert Pain management: pain level controlled Vital Signs Assessment: post-procedure vital signs reviewed and stable Respiratory status: spontaneous breathing, respiratory function stable and patient connected to nasal cannula oxygen Cardiovascular status: blood pressure returned to baseline and stable Postop Assessment: no headache, no backache and no apparent nausea or vomiting Anesthetic complications: no    Last Vitals:  Vitals:   10/29/18 1045 10/29/18 1100  BP: 114/68 120/73  Pulse: 62 (!) 58  Resp: 11 17  Temp:  36.7 C  SpO2: 100% 100%    Last Pain:  Vitals:   10/29/18 1100  TempSrc:   PainSc: 0-No pain                 Jawanda Passey S

## 2018-10-29 NOTE — Anesthesia Procedure Notes (Signed)
Anesthesia Procedure Image    

## 2018-10-29 NOTE — Transfer of Care (Signed)
Immediate Anesthesia Transfer of Care Note  Patient: Jasmin Allen  Procedure(s) Performed: TOTAL KNEE ARTHROPLASTY (Right )  Patient Location: PACU  Anesthesia Type:Spinal  Level of Consciousness: awake, alert  and oriented  Airway & Oxygen Therapy: Patient Spontanous Breathing and Patient connected to face mask oxygen  Post-op Assessment: Report given to RN and Post -op Vital signs reviewed and stable  Post vital signs: Reviewed and stable  Last Vitals:  Vitals Value Taken Time  BP    Temp    Pulse    Resp    SpO2      Last Pain:  Vitals:   10/29/18 0554  TempSrc: Oral  PainSc:          Complications: No apparent anesthesia complications

## 2018-10-29 NOTE — Anesthesia Procedure Notes (Signed)
Spinal  Patient location during procedure: OR Start time: 10/29/2018 7:40 AM End time: 10/29/2018 7:45 AM Staffing Anesthesiologist: Myrtie Soman, MD Preanesthetic Checklist Completed: patient identified, site marked, surgical consent, pre-op evaluation, timeout performed, IV checked, risks and benefits discussed and monitors and equipment checked Spinal Block Patient position: sitting Prep: DuraPrep Patient monitoring: heart rate, cardiac monitor, continuous pulse ox and blood pressure Approach: midline Location: L3-4 Injection technique: single-shot Needle Needle type: Sprotte  Needle gauge: 24 G Needle length: 9 cm

## 2018-10-29 NOTE — Anesthesia Procedure Notes (Signed)
Procedure Name: MAC Date/Time: 10/29/2018 7:42 AM Performed by: Eben Burow, CRNA Pre-anesthesia Checklist: Patient identified, Emergency Drugs available, Suction available, Patient being monitored and Timeout performed Oxygen Delivery Method: Simple face mask Dental Injury: Teeth and Oropharynx as per pre-operative assessment

## 2018-10-29 NOTE — Evaluation (Signed)
Physical Therapy Evaluation Patient Details Name: Jasmin Allen MRN: [id number] DOB: 06-26-1940 Today's Date: 10/29/2018   History of Present Illness  Pt s/p R TKR and with hx of chronic diastolic CHF and aortic valve replacement  Clinical Impression  Pt s/p R TKR and presents with decreased R LE strength/ROM and post op pain limiting functional mobility.  Pt should progress to dc home where she lives alone and will be assisted by friends.    Follow Up Recommendations Home health PT;Follow surgeon's recommendation for DC plan and follow-up therapies    Equipment Recommendations  None recommended by PT    Recommendations for Other Services       Precautions / Restrictions Precautions Precautions: Knee;Fall Restrictions Weight Bearing Restrictions: No Other Position/Activity Restrictions: WBAT      Mobility  Bed Mobility Overal bed mobility: Needs Assistance Bed Mobility: Supine to Sit;Sit to Supine     Supine to sit: Min assist Sit to supine: Min assist   General bed mobility comments: cues for sequence and use of L LE to self assist  Transfers Overall transfer level: Needs assistance Equipment used: Rolling walker (2 wheeled) Transfers: Sit to/from Stand Sit to Stand: Mod assist         General transfer comment: cues for LE management and use of UEs to self assist.  Physical assist to bring wt up and fwd and to balance in initial standing  Ambulation/Gait Ambulation/Gait assistance: Min assist;Mod assist Gait Distance (Feet): 4 Feet Assistive device: Rolling walker (2 wheeled) Gait Pattern/deviations: Step-to pattern;Decreased step length - right;Decreased step length - left;Shuffle;Trunk flexed Gait velocity: decr   General Gait Details: Pt side-stepped up side of bed only 2* increasing dizziness with standing  Stairs            Wheelchair Mobility    Modified Rankin (Stroke Patients Only)       Balance Overall balance assessment: Needs  assistance Sitting-balance support: Feet supported;No upper extremity supported Sitting balance-Leahy Scale: Good     Standing balance support: Bilateral upper extremity supported Standing balance-Leahy Scale: Poor                               Pertinent Vitals/Pain Pain Assessment: 0-10 Pain Score: 5  Pain Location: R knee Pain Descriptors / Indicators: Aching;Sore Pain Intervention(s): Limited activity within patient's tolerance;Monitored during session;Premedicated before session;Ice applied    Home Living Family/patient expects to be discharged to:: Private residence Living Arrangements: Alone Available Help at Discharge: Friend(s);Available 24 hours/day;Available PRN/intermittently Type of Home: House Home Access: Stairs to enter   Entrance Stairs-Number of Steps: 1 Home Layout: One level Home Equipment: Grab bars - toilet;Grab bars - tub/shower;Walker - 2 wheels;Walker - 4 wheels      Prior Function Level of Independence: Independent               Hand Dominance        Extremity/Trunk Assessment   Upper Extremity Assessment Upper Extremity Assessment: Overall WFL for tasks assessed    Lower Extremity Assessment Lower Extremity Assessment: RLE deficits/detail       Communication   Communication: No difficulties  Cognition Arousal/Alertness: Awake/alert Behavior During Therapy: WFL for tasks assessed/performed Overall Cognitive Status: Within Functional Limits for tasks assessed  General Comments      Exercises Total Joint Exercises Ankle Circles/Pumps: AROM;Both;15 reps;Supine   Assessment/Plan    PT Assessment Patient needs continued PT services  PT Problem List Decreased strength;Decreased range of motion;Decreased activity tolerance;Decreased mobility;Decreased balance;Decreased knowledge of use of DME;Pain;Decreased knowledge of precautions       PT Treatment  Interventions DME instruction;Gait training;Stair training;Functional mobility training;Therapeutic activities;Therapeutic exercise;Patient/family education    PT Goals (Current goals can be found in the Care Plan section)  Acute Rehab PT Goals Patient Stated Goal: Regain IND PT Goal Formulation: With patient Time For Goal Achievement: 11/05/18 Potential to Achieve Goals: Good    Frequency 7X/week   Barriers to discharge        Co-evaluation               AM-PAC PT "6 Clicks" Mobility  Outcome Measure Help needed turning from your back to your side while in a flat bed without using bedrails?: A Little Help needed moving from lying on your back to sitting on the side of a flat bed without using bedrails?: A Little Help needed moving to and from a bed to a chair (including a wheelchair)?: A Lot Help needed standing up from a chair using your arms (e.g., wheelchair or bedside chair)?: A Lot Help needed to walk in hospital room?: A Lot Help needed climbing 3-5 steps with a railing? : A Lot 6 Click Score: 14    End of Session Equipment Utilized During Treatment: Gait belt Activity Tolerance: Patient limited by fatigue;Other (comment)(dizziness) Patient left: in bed;with call bell/phone within reach Nurse Communication: Mobility status PT Visit Diagnosis: Difficulty in walking, not elsewhere classified (R26.2)    Time: 1550-1620 PT Time Calculation (min) (ACUTE ONLY): 30 min   Charges:   PT Evaluation $PT Eval Low Complexity: 1 Low PT Treatments $Therapeutic Activity: 8-22 mins        Debe Coder PT Acute Rehabilitation Services Pager [phone] Office [phone]   Terrea Bruster 10/29/2018, 5:22 PM

## 2018-10-29 NOTE — Interval H&P Note (Signed)
History and Physical Interval Note:  10/29/2018 7:33 AM  Jasmin Allen  has presented today for surgery, with the diagnosis of Right knee osteoarthritis.  The various methods of treatment have been discussed with the patient and family. After consideration of risks, benefits and other options for treatment, the patient has consented to  Procedure(s) with comments: TOTAL KNEE ARTHROPLASTY (Right) - with adductor canal as a surgical intervention.  The patient's history has been reviewed, patient examined, no change in status, stable for surgery.  I have reviewed the patient's chart and labs.  Questions were answered to the patient's satisfaction.     Westley Blass ANDREW

## 2018-10-29 NOTE — Op Note (Signed)
DATE OF SURGERY:  10/29/2018  TIME: 9:35 AM  PATIENT NAME:  Jasmin Allen    AGE: 78 y.o.   PRE-OPERATIVE DIAGNOSIS:  Right knee osteoarthritis  POST-OPERATIVE DIAGNOSIS:  Right knee osteoarthritis  PROCEDURE:  Procedure(s): TOTAL KNEE ARTHROPLASTY  SURGEON:  Undra Trembath ANDREW  ASSISTANT:  Bryson Stilwell, PA-C, present and scrubbed throughout the case, critical for assistance with exposure, retraction, instrumentation, and closure.  OPERATIVE IMPLANTS: Depuy PFC Attune  Rotating Platform.  Femur size 4, Tibia size 5, Patella size 32 3-peg oval button, with a 6 mm polyethylene insert.   PREOPERATIVE INDICATIONS:   Jasmin Allen is a 79 y.o. year old female with end stage bone on bone arthritis of the knee who failed conservative treatment and elected for Total Knee Arthroplasty.   The risks, benefits, and alternatives were discussed at length including but not limited to the risks of infection, bleeding, nerve injury, stiffness, blood clots, the need for revision surgery, cardiopulmonary complications, among others, and they were willing to proceed.  OPERATIVE DESCRIPTION:  The patient was brought to the operative room and placed in a supine position.  Spinal anesthesia was administered.  IV antibiotics were given.  The lower extremity was prepped and draped in the usual sterile fashion.  Time out was performed.  The leg was elevated and exsanguinated and the tourniquet was inflated.  Anterior quadriceps tendon splitting approach was performed.  The patella was retracted and osteophytes were removed.  The anterior horn of the medial and lateral meniscus was removed and cruciate ligaments resected.   The distal femur was opened with the drill and the intramedullary distal femoral cutting jig was utilized, set at 5 degrees resecting 10 mm off the distal femur.  Care was taken to protect the collateral ligaments.  The distal femoral sizing jig was applied, taking care to  avoid notching.  Then the 4-in-1 cutting jig was applied and the anterior and posterior femur was cut, along with the chamfer cuts.    Then the extramedullary tibial cutting jig was utilized making the appropriate cut using the anterior tibial crest as a reference building in appropriate posterior slope.  Care was taken during the cut to protect the medial and collateral ligaments.  The proximal tibia was removed along with the posterior horns of the menisci.   The posterior medial femoral osteophytes and posterior lateral femoral osteophytes were removed.    The flexion gap was then measured and was symmetric with the extension gap, measured at 6.  I completed the distal femoral preparation using the appropriate jig to prepare the box.  The patella was then measured, and cut with the saw.    The proximal tibia sized and prepared accordingly with the reamer and the punch, and then all components were trialed with the trial insert.  The knee was found to have excellent balance and full motion.    The above named components were then cemented into place and all excess cement was removed.  The trial polyethylene component was in place during cementation, and then was exchanged for the real polyethylene component.    The knee was easily taken through a range of motion and the patella tracked well and the knee irrigated copiously and the parapatellar and subcutaneous tissue closed with vicryl, and monocryl with steri strips for the skin.  The arthrotomy was closed at 90 of flexion. The wounds were dressed with sterile gauze and the tourniquet released and the patient was awakened and returned to the PACU  in stable and satisfactory condition.  There were no complications.  Total tourniquet time was 80 minutes.

## 2018-10-30 LAB — CBC
HCT: 29.8 % — ABNORMAL LOW (ref 36.0–46.0)
Hemoglobin: 10 g/dL — ABNORMAL LOW (ref 12.0–15.0)
MCH: 33.9 pg (ref 26.0–34.0)
MCHC: 33.6 g/dL (ref 30.0–36.0)
MCV: 101 fL — ABNORMAL HIGH (ref 80.0–100.0)
Platelets: 148 10*3/uL — ABNORMAL LOW (ref 150–400)
RBC: 2.95 MIL/uL — ABNORMAL LOW (ref 3.87–5.11)
RDW: 12.9 % (ref 11.5–15.5)
WBC: 7.7 10*3/uL (ref 4.0–10.5)
nRBC: 0 % (ref 0.0–0.2)

## 2018-10-30 LAB — BASIC METABOLIC PANEL
Anion gap: 7 (ref 5–15)
BUN: 13 mg/dL (ref 8–23)
CO2: 25 mmol/L (ref 22–32)
Calcium: 8.5 mg/dL — ABNORMAL LOW (ref 8.9–10.3)
Chloride: 105 mmol/L (ref 98–111)
Creatinine, Ser: 0.87 mg/dL (ref 0.44–1.00)
GFR calc Af Amer: >60 mL/min (ref >60)
GFR calc non Af Amer: >60 mL/min (ref >60)
Glucose, Bld: 141 mg/dL — ABNORMAL HIGH (ref 70–99)
Potassium: 5.1 mmol/L (ref 3.5–5.1)
Sodium: 137 mmol/L (ref 135–145)

## 2018-10-30 NOTE — Progress Notes (Signed)
Physical Therapy Treatment Patient Details Name: Jasmin Allen MRN: [id number] DOB: 1940/12/28 Today's Date: 10/30/2018    History of Present Illness Pt s/p R TKR and with hx of chronic diastolic CHF and aortic valve replacement    PT Comments    Pt very motivated and with marked improvement in activity tolerance and performance of all mobility tasks.  Pt hopeful for return home tomorrow.  Follow Up Recommendations  Home health PT;Follow surgeon's recommendation for DC plan and follow-up therapies     Equipment Recommendations  None recommended by PT    Recommendations for Other Services       Precautions / Restrictions Precautions Precautions: Knee;Fall Restrictions Weight Bearing Restrictions: No Other Position/Activity Restrictions: WBAT    Mobility  Bed Mobility Overal bed mobility: Needs Assistance Bed Mobility: Supine to Sit     Supine to sit: Min assist     General bed mobility comments: cues for sequence and use of L LE to self assist  Transfers Overall transfer level: Needs assistance Equipment used: Rolling walker (2 wheeled) Transfers: Sit to/from Stand Sit to Stand: Min assist;Mod assist         General transfer comment: cues for LE management and use of UEs to self assist.  Physical assist to bring wt up and fwd and to balance in initial standing  Ambulation/Gait Ambulation/Gait assistance: Min assist Gait Distance (Feet): 58 Feet Assistive device: Rolling walker (2 wheeled) Gait Pattern/deviations: Step-to pattern;Decreased step length - right;Decreased step length - left;Shuffle;Trunk flexed Gait velocity: decr   General Gait Details: cues for sequence, posture and position from RW; pt with ltd WB tolerance R LE   Stairs             Wheelchair Mobility    Modified Rankin (Stroke Patients Only)       Balance Overall balance assessment: Needs assistance Sitting-balance support: Feet supported;No upper extremity  supported Sitting balance-Leahy Scale: Good     Standing balance support: Bilateral upper extremity supported Standing balance-Leahy Scale: Poor                              Cognition Arousal/Alertness: Awake/alert Behavior During Therapy: WFL for tasks assessed/performed Overall Cognitive Status: Within Functional Limits for tasks assessed                                        Exercises Total Joint Exercises Ankle Circles/Pumps: AROM;Both;15 reps;Supine Quad Sets: AROM;Both;10 reps;Supine Heel Slides: AAROM;Right;15 reps;Supine Straight Leg Raises: AAROM;Right;10 reps;Supine Goniometric ROM: AAROM R knee -10 - 35 with ++ muscle guarding    General Comments        Pertinent Vitals/Pain Pain Assessment: 0-10 Pain Score: 4  Pain Location: R knee Pain Descriptors / Indicators: Aching;Sore Pain Intervention(s): Limited activity within patient's tolerance;Monitored during session;Premedicated before session;Ice applied    Home Living                      Prior Function            PT Goals (current goals can now be found in the care plan section) Acute Rehab PT Goals Patient Stated Goal: Regain IND PT Goal Formulation: With patient Time For Goal Achievement: 11/05/18 Potential to Achieve Goals: Good Progress towards PT goals: Progressing toward goals    Frequency    7X/week  PT Plan Current plan remains appropriate    Co-evaluation              AM-PAC PT "6 Clicks" Mobility   Outcome Measure  Help needed turning from your back to your side while in a flat bed without using bedrails?: A Little Help needed moving from lying on your back to sitting on the side of a flat bed without using bedrails?: A Little Help needed moving to and from a bed to a chair (including a wheelchair)?: A Little Help needed standing up from a chair using your arms (e.g., wheelchair or bedside chair)?: A Little Help needed to walk in  hospital room?: A Little Help needed climbing 3-5 steps with a railing? : A Lot 6 Click Score: 17    End of Session Equipment Utilized During Treatment: Gait belt Activity Tolerance: Patient limited by fatigue;Other (comment) Patient left: in chair;with call bell/phone within reach Nurse Communication: Mobility status PT Visit Diagnosis: Difficulty in walking, not elsewhere classified (R26.2)     Time: 2330-0762 PT Time Calculation (min) (ACUTE ONLY): 42 min  Charges:  $Gait Training: 8-22 mins $Therapeutic Exercise: 8-22 mins $Therapeutic Activity: 8-22 mins                     [place] Pager [phone] Office [phone]    Braddock Servellon 10/30/2018, 12:57 PM

## 2018-10-30 NOTE — TOC Initial Note (Signed)
Transition of Care [facility]) - Initial/Assessment Note    Patient Details  Name: Jasmin Allen MRN: [id number] Date of Birth: [birth year]  [facility] CM/SW Contact:    Apolonio Schneiders, RN Phone Number: 10/30/2018, 12:12 PM  Clinical Narrative:   CM spoke with the patient at the bedside. Patient states her husband passed away in 18-Sep-2014. She lives alone. She has arranged for a friend to come to stay with her when she is discharged home. States her neighbors have offered to bring her food. She has an ice therapy unit, 3N1, Rolator and a cane. CM provided the patient with a list of home health agencies servicing her area from CMS compare. The patient will review the list and provide her choice tomorrow.               Expected Discharge Plan: [place] Barriers to Discharge: No Barriers Identified   Patient Goals and CMS Choice   CMS Medicare.gov Compare Post Acute Care list provided to:: Patient Choice offered to / list presented to : Patient  Expected Discharge Plan and Services Expected Discharge Plan: [place]         Expected Discharge Date: 10/31/18                                    Prior Living Arrangements/Services                Care giver support system in place?: Yes (comment)(a friend will be staying with the patient when she is discharged home)      Activities of Daily Living Home Assistive Devices/Equipment: Eyeglasses ADL Screening (condition at time of admission) Patient's cognitive ability adequate to safely complete daily activities?: Yes Is the patient deaf or have difficulty hearing?: No Does the patient have difficulty seeing, even when wearing glasses/contacts?: No Does the patient have difficulty concentrating, remembering, or making decisions?: No Patient able to express need for assistance with ADLs?: Yes Does the patient have difficulty dressing or bathing?: No Independently  performs ADLs?: Yes (appropriate for developmental age) Does the patient have difficulty walking or climbing stairs?: No Weakness of Legs: Right Weakness of Arms/Hands: None  Permission Sought/Granted                  Emotional Assessment Appearance:: Appears stated age Attitude/Demeanor/Rapport: Engaged Affect (typically observed): Accepting        Admission diagnosis:  S/P knee replacement [Z96.659] Patient Active Problem List   Diagnosis Date Noted  . Primary osteoarthritis of one knee, right 10/29/2018  . S/P knee replacement 10/29/2018  . S/P aortic valve replacement with bioprosthetic valve 09/17/2016  . S/P AVR (aortic valve replacement) 09/17/2016  . Essential hypertension   . Hyperlipidemia   . Chronic diastolic congestive heart failure ([place])   . Anemia 03/02/2016  . Lymphadenopathy of left cervical region 04/07/2012   PCP:  Lonia Mad, MD Pharmacy:   [id number] - [place], [place] [address] Phone: [phone] Fax: [phone]     Social Determinants of Health (SDOH) Interventions    Readmission Risk Interventions No flowsheet data found.

## 2018-10-30 NOTE — Plan of Care (Signed)
  Problem: Activity: Goal: Ability to avoid complications of mobility impairment will improve Outcome: Progressing Goal: Ability to tolerate increased activity will improve Outcome: Progressing   Problem: Education: Goal: Verbalization of understanding the information provided will improve Outcome: Progressing   Problem: Coping: Goal: Level of anxiety will decrease Outcome: Progressing   Problem: Physical Regulation: Goal: Postoperative complications will be avoided or minimized Outcome: Progressing   Problem: Respiratory: Goal: Ability to maintain a clear airway will improve Outcome: Progressing   Problem: Pain Management: Goal: Pain level will decrease Outcome: Progressing   Problem: Skin Integrity: Goal: Signs of wound healing will improve Outcome: Progressing   Problem: Tissue Perfusion: Goal: Ability to maintain adequate tissue perfusion will improve Outcome: Progressing  Pt progressing as expected.  Home tomorrow.  Care plan discussed with pt.

## 2018-10-30 NOTE — Progress Notes (Signed)
Physical Therapy Treatment Patient Details Name: Jasmin Allen MRN: [id number] DOB: 01-08-1941 Today's Date: 10/30/2018    History of Present Illness Pt s/p R TKR and with hx of chronic diastolic CHF and aortic valve replacement    PT Comments    Pt continues cooperative and progressing steadily with mobility.  Pt hopeful for dc home tomorrow.   Follow Up Recommendations  Home health PT;Follow surgeon's recommendation for DC plan and follow-up therapies     Equipment Recommendations  None recommended by PT    Recommendations for Other Services       Precautions / Restrictions Precautions Precautions: Knee;Fall Restrictions Weight Bearing Restrictions: No Other Position/Activity Restrictions: WBAT    Mobility  Bed Mobility Overal bed mobility: Needs Assistance Bed Mobility: Sit to Supine     Supine to sit: Min assist Sit to supine: Min assist   General bed mobility comments: cues for sequence and use of L LE to self assist  Transfers Overall transfer level: Needs assistance Equipment used: Rolling walker (2 wheeled) Transfers: Sit to/from Stand Sit to Stand: Min assist         General transfer comment: cues for LE management and use of UEs to self assist.  Physical assist to bring wt up and fwd and to balance in initial standing  Ambulation/Gait Ambulation/Gait assistance: Min assist Gait Distance (Feet): 100 Feet Assistive device: Rolling walker (2 wheeled) Gait Pattern/deviations: Step-to pattern;Decreased step length - right;Decreased step length - left;Shuffle;Trunk flexed Gait velocity: decr   General Gait Details: cues for sequence, posture and position from RW; pt with ltd WB tolerance R LE   Stairs             Wheelchair Mobility    Modified Rankin (Stroke Patients Only)       Balance Overall balance assessment: Needs assistance Sitting-balance support: Feet supported;No upper extremity supported Sitting balance-Leahy Scale:  Good     Standing balance support: Bilateral upper extremity supported Standing balance-Leahy Scale: Fair                              Cognition Arousal/Alertness: Awake/alert Behavior During Therapy: WFL for tasks assessed/performed Overall Cognitive Status: Within Functional Limits for tasks assessed                                        Exercises Total Joint Exercises Ankle Circles/Pumps: AROM;Both;15 reps;Supine Quad Sets: AROM;Both;10 reps;Supine Heel Slides: AAROM;Right;15 reps;Supine Straight Leg Raises: AAROM;Right;10 reps;Supine Goniometric ROM: AAROM R knee -10 - 35 with ++ muscle guarding    General Comments        Pertinent Vitals/Pain Pain Assessment: 0-10 Pain Score: 4  Pain Location: R knee Pain Descriptors / Indicators: Aching;Sore Pain Intervention(s): Limited activity within patient's tolerance;Monitored during session;Premedicated before session;Ice applied    Home Living                      Prior Function            PT Goals (current goals can now be found in the care plan section) Acute Rehab PT Goals Patient Stated Goal: Regain IND PT Goal Formulation: With patient Time For Goal Achievement: 11/05/18 Potential to Achieve Goals: Good Progress towards PT goals: Progressing toward goals    Frequency    7X/week      PT  Plan Current plan remains appropriate    Co-evaluation              AM-PAC PT "6 Clicks" Mobility   Outcome Measure  Help needed turning from your back to your side while in a flat bed without using bedrails?: A Little Help needed moving from lying on your back to sitting on the side of a flat bed without using bedrails?: A Little Help needed moving to and from a bed to a chair (including a wheelchair)?: A Little Help needed standing up from a chair using your arms (e.g., wheelchair or bedside chair)?: A Little Help needed to walk in hospital room?: A Little Help needed  climbing 3-5 steps with a railing? : A Lot 6 Click Score: 17    End of Session Equipment Utilized During Treatment: Gait belt Activity Tolerance: Patient tolerated treatment well Patient left: in bed;with call bell/phone within reach Nurse Communication: Mobility status PT Visit Diagnosis: Difficulty in walking, not elsewhere classified (R26.2)     Time: 6433-2951 PT Time Calculation (min) (ACUTE ONLY): 42 min  Charges:  $Gait Training: 8-22 mins $Therapeutic Exercise: 8-22 mins $Therapeutic Activity: 8-22 mins                     [place] Pager [phone] Office [phone]    Jasmin Allen 10/30/2018, 3:17 PM

## 2018-10-30 NOTE — H&P (Signed)
Jasmin Allen  MRN: [id number] DOB/Age: 1940-07-02 78 y.o. Physician: Jasmin Allen, M.D. 1 Day Post-Op Procedure(s) (LRB): TOTAL KNEE ARTHROPLASTY (Right)  Subjective: Resting comfortably in bed.  Therapist is currently working with patient on knee motion.  Reports minimal pain. Vital Signs Temp:  [97.4 F (36.3 C)-98.1 F (36.7 C)] 97.5 F (36.4 C) (05/30 0535) Pulse Rate:  [57-66] 62 (05/30 0535) Resp:  [11-20] 18 (05/30 0535) BP: (102-131)/(54-78) 109/54 (05/30 0535) SpO2:  [96 %-100 %] 100 % (05/30 0535) Weight:  [89.8 kg] 89.8 kg (05/29 1259)  Lab Results Recent Labs    10/29/18 0600 10/30/18 0410  WBC 4.2 7.7  HGB 12.1 10.0*  HCT 35.5* 29.8*  PLT 129* 148*   BMET Recent Labs    10/29/18 0600 10/30/18 0410  NA 139 137  K 4.0 5.1  CL 104 105  CO2 27 25  GLUCOSE 128* 141*  BUN 19 13  CREATININE 1.03* 0.87  CALCIUM 9.2 8.5*   INR  Date Value Ref Range Status  10/29/2018 1.0 0.8 - 1.2 Final    Comment:    (NOTE) INR goal varies based on device and disease states. Performed at [facility], [place] [address], [place], [place] [address]      Exam  Postop dressings are clean and dry.  Hemovac is removed without difficulty.  Neurovascular intact distally in the right lower extremity.  Plan Advance rehabilitation per TKA protocol.  Anticipate discharge tomorrow to home. Jasmin Allen 10/30/2018, 8:24 AM    Contact # [phone]

## 2018-10-31 ENCOUNTER — Encounter (HOSPITAL_COMMUNITY): Payer: Self-pay | Admitting: Specialist

## 2018-10-31 LAB — CBC
HCT: 26.3 % — ABNORMAL LOW (ref 36.0–46.0)
Hemoglobin: 8.9 g/dL — ABNORMAL LOW (ref 12.0–15.0)
MCH: 34.6 pg — ABNORMAL HIGH (ref 26.0–34.0)
MCHC: 33.8 g/dL (ref 30.0–36.0)
MCV: 102.3 fL — ABNORMAL HIGH (ref 80.0–100.0)
Platelets: 126 10*3/uL — ABNORMAL LOW (ref 150–400)
RBC: 2.57 MIL/uL — ABNORMAL LOW (ref 3.87–5.11)
RDW: 13.3 % (ref 11.5–15.5)
WBC: 7.3 10*3/uL (ref 4.0–10.5)
nRBC: 0 % (ref 0.0–0.2)

## 2018-10-31 MED ORDER — OXYCODONE HCL 5 MG PO TABS: 5.0000 mg | ORAL_TABLET | ORAL | 0 refills | Status: DC | PRN

## 2018-10-31 NOTE — TOC Transition Note (Signed)
Transition of Care [facility]) - CM/SW Discharge Note   Patient Details  Name: Jasmin Allen MRN: [id number] Date of Birth: 05-Sep-1940  Transition of Care [facility]) CM/SW Contact:  Joaquin Courts, RN Phone Number: 10/31/2018, 10:30 AM   Clinical Narrative: CM spoke with pt at bedside. Pt selects [facility] for HHPT services. Agency contacted and referral given.       Barriers to Discharge: No Barriers Identified   Patient Goals and CMS Choice   CMS Medicare.gov Compare Post Acute Care list provided to:: Patient Choice offered to / list presented to : Patient  Discharge Placement                       Discharge Plan and Services     Post Acute Care Choice: Home Health                    HH Arranged: PT [place] Agency: ([facility]) Date [place]: 10/31/18 Time [place]: 1030 Representative spoke with at [place]: [place] ([place]) Interventions     Readmission Risk Interventions No flowsheet data found.

## 2018-10-31 NOTE — Progress Notes (Signed)
Physical Therapy Treatment Patient Details Name: Jasmin Allen MRN: [id number] DOB: 1941-03-29 Today's Date: 10/31/2018    History of Present Illness Pt s/p R TKR and with hx of chronic diastolic CHF and aortic valve replacement    PT Comments    Pt with continued steady progress with mobility but struggling with knee ROM.  Pt eager for dc home this date.   Follow Up Recommendations  Home health PT;Follow surgeon's recommendation for DC plan and follow-up therapies     Equipment Recommendations  None recommended by PT    Recommendations for Other Services       Precautions / Restrictions Precautions Precautions: Knee;Fall Restrictions Weight Bearing Restrictions: No Other Position/Activity Restrictions: WBAT    Mobility  Bed Mobility Overal bed mobility: Needs Assistance Bed Mobility: Supine to Sit       Sit to supine: Min assist   General bed mobility comments: min assist to bring R LE onto bed  Transfers Overall transfer level: Needs assistance Equipment used: Rolling walker (2 wheeled) Transfers: Sit to/from Stand Sit to Stand: Min guard;Supervision         General transfer comment: cues for LE management and use of UEs to self assist.   Ambulation/Gait Ambulation/Gait assistance: Min guard;Supervision Gait Distance (Feet): 150 Feet Assistive device: Rolling walker (2 wheeled) Gait Pattern/deviations: Step-to pattern;Decreased step length - right;Decreased step length - left;Shuffle;Trunk flexed Gait velocity: decr   General Gait Details: cues for sequence, posture and position from RW; pt with ltd WB tolerance R LE   Stairs             Wheelchair Mobility    Modified Rankin (Stroke Patients Only)       Balance Overall balance assessment: Needs assistance Sitting-balance support: Feet supported;No upper extremity supported Sitting balance-Leahy Scale: Good     Standing balance support: Bilateral upper extremity  supported Standing balance-Leahy Scale: Fair                              Cognition Arousal/Alertness: Awake/alert Behavior During Therapy: WFL for tasks assessed/performed Overall Cognitive Status: Within Functional Limits for tasks assessed                                        Exercises Total Joint Exercises Ankle Circles/Pumps: AROM;Both;15 reps;Supine Quad Sets: AROM;Both;Supine;15 reps Heel Slides: AAROM;Right;Supine;20 reps Straight Leg Raises: AAROM;Right;Supine;20 reps Goniometric ROM: AAROM R knee -8 - 35 with ++ muscle guarding    General Comments        Pertinent Vitals/Pain Pain Assessment: 0-10 Pain Score: 4  Pain Location: R knee Pain Descriptors / Indicators: Aching;Sore Pain Intervention(s): Limited activity within patient's tolerance;Monitored during session;Premedicated before session    Home Living                      Prior Function            PT Goals (current goals can now be found in the care plan section) Acute Rehab PT Goals Patient Stated Goal: Regain IND PT Goal Formulation: With patient Time For Goal Achievement: 11/05/18 Potential to Achieve Goals: Good Progress towards PT goals: Progressing toward goals    Frequency    7X/week      PT Plan Current plan remains appropriate    Co-evaluation  AM-PAC PT "6 Clicks" Mobility   Outcome Measure  Help needed turning from your back to your side while in a flat bed without using bedrails?: A Little Help needed moving from lying on your back to sitting on the side of a flat bed without using bedrails?: A Little Help needed moving to and from a bed to a chair (including a wheelchair)?: A Little Help needed standing up from a chair using your arms (e.g., wheelchair or bedside chair)?: A Little Help needed to walk in hospital room?: A Little Help needed climbing 3-5 steps with a railing? : A Lot 6 Click Score: 17    End of  Session Equipment Utilized During Treatment: Gait belt Activity Tolerance: Patient tolerated treatment well Patient left: in bed;with call bell/phone within reach Nurse Communication: Mobility status PT Visit Diagnosis: Difficulty in walking, not elsewhere classified (R26.2)     Time: 7290-2111 PT Time Calculation (min) (ACUTE ONLY): 46 min  Charges:  $Gait Training: 8-22 mins $Therapeutic Exercise: 8-22 mins $Therapeutic Activity: 8-22 mins                     Debe Coder PT Acute Rehabilitation Services Pager [phone] Office [phone]    Jasmin Allen 10/31/2018, 12:40 PM

## 2018-10-31 NOTE — Progress Notes (Signed)
   Subjective: 2 Days Post-Op Procedure(s) (LRB): TOTAL KNEE ARTHROPLASTY (Right) Patient reports pain as mild.   Plan is to go Home after hospital stay.  Objective: Vital signs in last 24 hours: Temp:  [97.6 F (36.4 C)-98.4 F (36.9 C)] 97.6 F (36.4 C) (05/31 0650) Pulse Rate:  [58-73] 73 (05/31 0650) Resp:  [14-16] 16 (05/31 0650) BP: (118-130)/(49-83) 130/83 (05/31 0650) SpO2:  [100 %] 100 % (05/31 0650)  Intake/Output from previous day:  Intake/Output Summary (Last 24 hours) at 10/31/2018 0834 Last data filed at 10/31/2018 0600 Gross per 24 hour  Intake 750 ml  Output 1400 ml  Net -650 ml    Intake/Output this shift: No intake/output data recorded.  Labs: Recent Labs    10/29/18 0600 10/30/18 0410 10/31/18 0304  HGB 12.1 10.0* 8.9*   Recent Labs    10/30/18 0410 10/31/18 0304  WBC 7.7 7.3  RBC 2.95* 2.57*  HCT 29.8* 26.3*  PLT 148* 126*   Recent Labs    10/29/18 0600 10/30/18 0410  NA 139 137  K 4.0 5.1  CL 104 105  CO2 27 25  BUN 19 13  CREATININE 1.03* 0.87  GLUCOSE 128* 141*  CALCIUM 9.2 8.5*   Recent Labs    10/29/18 0600  INR 1.0    EXAM General - Patient is Alert, Appropriate and Oriented Extremity - Neurologically intact Neurovascular intact No cellulitis present Compartment soft Dressing/Incision - clean, dry, no drainage Motor Function - intact, moving foot and toes well on exam.   Past Medical History:  Diagnosis Date  . Aortic stenosis   . Arthritis   . Bicuspid aortic valve   . Cervical lymphadenopathy   . Chronic diastolic congestive heart failure ([place])   . H/O bronchitis 07/2013  . Heart murmur   . Hepatitis    JAUNDICE  1960  . Hyperlipemia   . Hypertension   . Immature cataract of both eyes   . PONV (postoperative nausea and vomiting)    with gallbladder surgery , reports no issues with valve surgery   . 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

## 2018-11-02 NOTE — Progress Notes (Signed)
Received call from [facility] home [facility] requesting OP note. Note faxed to gail at [facility] home health.

## 2018-11-08 NOTE — Discharge Summary (Signed)
Physician Discharge Summary  Patient ID: Jasmin Allen MRN: [id number] DOB/AGE: 11-05-1940 78 y.o.  Admit date: 10/29/2018 Discharge date: 11/08/2018  Admission Diagnoses: none  Discharge Diagnoses: right TKR  Discharged Condition: stable  Hospital Course: stable Consultsnone Significant Diagnostic Studies: labs:   Operative ProcedureRight TKA  Disposition: home  Discharge Instructions    Call MD / Call 911   Complete by:  As directed    If you experience chest pain or shortness of breath, CALL 911 and be transported to the hospital emergency room.  If you develope a fever above 101 F, pus (white drainage) or increased drainage or redness at the wound, or calf pain, call your surgeon's office.   Constipation Prevention   Complete by:  As directed    Drink plenty of fluids.  Prune juice may be helpful.  You may use a stool softener, such as Colace (over the counter) 100 mg twice a day.  Use MiraLax (over the counter) for constipation as needed.   Diet - low sodium heart healthy   Complete by:  As directed    Discharge instructions   Complete by:  As directed    INSTRUCTIONS AFTER JOINT REPLACEMENT   Remove items at home which could result in a fall. This includes throw rugs or furniture in walking pathways ICE to the affected joint every three hours while awake for 30 minutes at a time, for at least the first 3-5 days, and then as needed for pain and swelling.  Continue to use ice for pain and swelling. You may notice swelling that will progress down to the foot and ankle.  This is normal after surgery.  Elevate your leg when you are not up walking on it.   Continue to use the breathing machine you got in the hospital (incentive spirometer) which will help keep your temperature down.  It is common for your temperature to cycle up and down following surgery, especially at night when you are not up moving around and exerting yourself.  The breathing machine keeps your lungs expanded  and your temperature down.   DIET:  As you were doing prior to hospitalization, we recommend a well-balanced diet.  DRESSING / WOUND CARE / SHOWERING  Keep the surgical dressing until follow up.  The dressing is water proof, so you can shower without any extra covering.  IF THE DRESSING FALLS OFF or the wound gets wet inside, change the dressing with sterile gauze.  Please use good hand washing techniques before changing the dressing.  Do not use any lotions or creams on the incision until instructed by your surgeon.    ACTIVITY  Increase activity slowly as tolerated, but follow the weight bearing instructions below.   No driving for 6 weeks or until further direction given by your physician.  You cannot drive while taking narcotics.  No lifting or carrying greater than 10 lbs. until further directed by your surgeon. Avoid periods of inactivity such as sitting longer than an hour when not asleep. This helps prevent blood clots.  You may return to work once you are authorized by your doctor.     WEIGHT BEARING   Weight bearing as tolerated with assist device (walker, cane, etc) as directed, use it as long as suggested by your surgeon or therapist, typically at least 4-6 weeks.   EXERCISES  Results after joint replacement surgery are often greatly improved when you follow the exercise, range of motion and muscle strengthening exercises prescribed by your doctor.  Safety measures are also important to protect the joint from further injury. Any time any of these exercises cause you to have increased pain or swelling, decrease what you are doing until you are comfortable again and then slowly increase them. If you have problems or questions, call your caregiver or physical therapist for advice.   Rehabilitation is important following a joint replacement. After just a few days of immobilization, the muscles of the leg can become weakened and shrink (atrophy).  These exercises are designed to  build up the tone and strength of the thigh and leg muscles and to improve motion. Often times heat used for twenty to thirty minutes before working out will loosen up your tissues and help with improving the range of motion but do not use heat for the first two weeks following surgery (sometimes heat can increase post-operative swelling).   These exercises can be done on a training (exercise) mat, on the floor, on a table or on a bed. Use whatever works the best and is most comfortable for you.    Use music or television while you are exercising so that the exercises are a pleasant break in your day. This will make your life better with the exercises acting as a break in your routine that you can look forward to.   Perform all exercises about fifteen times, three times per day or as directed.  You should exercise both the operative leg and the other leg as well.   Exercises include:   Quad Sets - Tighten up the muscle on the front of the thigh (Quad) and hold for 5-10 seconds.   Straight Leg Raises - With your knee straight (if you were given a brace, keep it on), lift the leg to 60 degrees, hold for 3 seconds, and slowly lower the leg.  Perform this exercise against resistance later as your leg gets stronger.  Leg Slides: Lying on your back, slowly slide your foot toward your buttocks, bending your knee up off the floor (only go as far as is comfortable). Then slowly slide your foot back down until your leg is flat on the floor again.  Angel Wings: Lying on your back spread your legs to the side as far apart as you can without causing discomfort.  Hamstring Strength:  Lying on your back, push your heel against the floor with your leg straight by tightening up the muscles of your buttocks.  Repeat, but this time bend your knee to a comfortable angle, and push your heel against the floor.  You may put a pillow under the heel to make it more comfortable if necessary.   A rehabilitation program following  joint replacement surgery can speed recovery and prevent re-injury in the future due to weakened muscles. Contact your doctor or a physical therapist for more information on knee rehabilitation.    CONSTIPATION  Constipation is defined medically as fewer than three stools per week and severe constipation as less than one stool per week.  Even if you have a regular bowel pattern at home, your normal regimen is likely to be disrupted due to multiple reasons following surgery.  Combination of anesthesia, postoperative narcotics, change in appetite and fluid intake all can affect your bowels.   YOU MUST use at least one of the following options; they are listed in order of increasing strength to get the job done.  They are all available over the counter, and you may need to use some, POSSIBLY even all of  these options:    Drink plenty of fluids (prune juice may be helpful) and high fiber foods Colace 100 mg by mouth twice a day  Senokot for constipation as directed and as needed Dulcolax (bisacodyl), take with full glass of water  Miralax (polyethylene glycol) once or twice a day as needed.  If you have tried all these things and are unable to have a bowel movement in the first 3-4 days after surgery call either your surgeon or your primary doctor.    If you experience loose stools or diarrhea, hold the medications until you stool forms back up.  If your symptoms do not get better within 1 week or if they get worse, check with your doctor.  If you experience "the worst abdominal pain ever" or develop nausea or vomiting, please contact the office immediately for further recommendations for treatment.   ITCHING:  If you experience itching with your medications, try taking only a single pain pill, or even half a pain pill at a time.  You can also use Benadryl over the counter for itching or also to help with sleep.   TED HOSE STOCKINGS:  Use stockings on both legs until for at least 2 weeks or as  directed by physician office. They may be removed at night for sleeping.  MEDICATIONS:  See your medication summary on the "After Visit Summary" that nursing will review with you.  You may have some home medications which will be placed on hold until you complete the course of blood thinner medication.  It is important for you to complete the blood thinner medication as prescribed.  PRECAUTIONS:  If you experience chest pain or shortness of breath - call 911 immediately for transfer to the hospital emergency department.   If you develop a fever greater that 101 F, purulent drainage from wound, increased redness or drainage from wound, foul odor from the wound/dressing, or calf pain - CONTACT YOUR SURGEON.                                                   FOLLOW-UP APPOINTMENTS:  If you do not already have a post-op appointment, please call the office for an appointment to be seen by your surgeon.  Guidelines for how soon to be seen are listed in your "After Visit Summary", but are typically between 1-4 weeks after surgery.  OTHER INSTRUCTIONS:   Knee Replacement:  Do not place pillow under knee, focus on keeping the knee straight while resting. CPM instructions: 0-90 degrees, 2 hours in the morning, 2 hours in the afternoon, and 2 hours in the evening. Place foam block, curve side up under heel at all times except when in CPM or when walking.  DO NOT modify, tear, cut, or change the foam block in any way.  MAKE SURE YOU:  Understand these instructions.  Get help right away if you are not doing well or get worse.    Thank you for letting us be a part of your medical care team.  It is a privilege we respect greatly.  We hope these instructions will help you stay on track for a fast and full recovery!   Increase activity slowly as tolerated   Complete by:  As directed    TED hose   Complete by:  As directed    Use stockings (  TED hose) for 2 weeks on right leg(s).  You may remove them at night  for sleeping.     Allergies as of 10/31/2018      Reactions   Penicillins Swelling   Has patient had a PCN reaction causing immediate rash, facial/tongue/throat swelling, SOB or lightheadedness with hypotension: Unknown Has patient had a PCN reaction causing severe rash involving mucus membranes or skin necrosis: Unknown Has patient had a PCN reaction that required hospitalization No Has patient had a PCN reaction occurring within the last 10 years: No If all of the above answers are "NO", then may proceed with Cephalosporin use.   Sulfa Antibiotics Hives      Medication List    STOP taking these medications   fexofenadine 180 MG tablet Commonly known as:  ALLEGRA     TAKE these medications   acetaminophen 650 MG CR tablet Commonly known as:  TYLENOL Take 650 mg by mouth every 8 (eight) hours as needed for pain. Has stopped prior to procedure   aspirin EC 325 MG tablet Take 1 tablet (325 mg total) by mouth 2 (two) times daily for 30 days. What changed:    medication strength  how much to take  when to take this   B-complex with vitamin C tablet Take 1 tablet by mouth every evening.   Calcium 600+D3 600-400 MG-UNIT Tabs Generic drug:  Calcium Carbonate-Vitamin D3 Take 1 tablet by mouth at bedtime.   carvedilol 6.25 MG tablet Commonly known as:  COREG Take 6.25 mg by mouth 2 (two) times daily with a meal.   clindamycin 300 MG capsule Commonly known as:  CLEOCIN Take 300 mg by mouth. Take 2 capsules by mouth 1 hours prior to dental appointment   Co Q-10 200 MG Caps Take 200 mg by mouth at bedtime.   ELDERBERRY PO Place under the tongue daily.   fluticasone 50 MCG/ACT nasal spray Commonly known as:  FLONASE INSTILL 2 SPRAYS IN EACH NOSTRIL EVERY DAY What changed:  See the new instructions.   folic acid 941 MCG tablet Commonly known as:  FOLVITE Take 400 mcg by mouth at bedtime.   furosemide 20 MG tablet Commonly known as:  LASIX Take 1 tablet (20 mg  total) by mouth daily.   loratadine 10 MG tablet Commonly known as:  CLARITIN Take 10 mg by mouth daily as needed for allergies.   losartan 100 MG tablet Commonly known as:  COZAAR Take 100 mg by mouth daily.   methocarbamol 500 MG tablet Commonly known as:  ROBAXIN Take 1 tablet (500 mg total) by mouth every 8 (eight) hours as needed for muscle spasms.   omeprazole 20 MG capsule Commonly known as:  PRILOSEC Take 20 mg by mouth daily as needed (acid reflux).   oxyCODONE 5 MG immediate release tablet Commonly known as:  Oxy IR/ROXICODONE Take 1-2 tablets (5-10 mg total) by mouth every 4 (four) hours as needed for moderate pain (pain score 4-6).   pravastatin 20 MG tablet Commonly known as:  PRAVACHOL Take 20 mg by mouth at bedtime.   vitamin B-12 1000 MCG tablet Commonly known as:  CYANOCOBALAMIN Take 2,000 mcg by mouth every evening.   Vitamin D 50 MCG (2000 UT) tablet Take 2,000 Units by mouth at bedtime.     ASK your doctor about these medications   oxyCODONE 5 MG immediate release tablet Commonly known as:  Roxicodone Take 1 tablet (5 mg total) by mouth every 4 (four) hours as needed for  up to 7 days. Ask about: Should I take this medication?      Follow-up Information    [facility]. Call.   Why:  Home Health physical therapy agency will contact you to set up an appointment time. Contact information: [phone]          Discharge Instructions: Given and explained to patient.  Signed: Cynda Familia 11/08/2018, 5:23 PM

## 2019-10-18 NOTE — Progress Notes (Signed)
HPI: FU bicuspid aortic valve and severe aortic stenosis.Echo1/11/18 showednormal LV systolic function, mild left atrial enlargement, mild mitral regurgitation, bicuspid aortic valve with trace aortic insufficiency and severe aortic stenosis with mean gradient 43 mmHg. CTA 2/18 showed no TAA; LLL pulmonary nodule; fu 12 months.Patient had cardiac catheterization 08/14/2016 showed no coronary disease. Carotid Dopplers April 2018 showed no significant stenosis. Patient had aortic valve replacement with a pericardial tissue valve in April 2018.  Postoperative echocardiogram June 2018 showed vigorous LV function, mild diastolic dysfunction, bioprosthetic aortic valve with no aortic stenosis or aortic insufficiency and mild tricuspid regurgitation.  Follow-up chest CT January 2019 showed no nodule.  Since last seen, she denies dyspnea, chest pain, palpitations or syncope.  Current Outpatient Medications  Medication Sig Dispense Refill  . acetaminophen (TYLENOL) 650 MG CR tablet Take 650 mg by mouth every 8 (eight) hours as needed for pain. Has stopped prior to procedure    . B Complex-C (B-COMPLEX WITH VITAMIN C) tablet Take 1 tablet by mouth every evening.    . Calcium Carbonate-Vitamin D3 (CALCIUM 600+D3) 600-400 MG-UNIT TABS Take 1 tablet by mouth at bedtime.    . carvedilol (COREG) 6.25 MG tablet Take 6.25 mg by mouth 2 (two) times daily with a meal.     . Cholecalciferol (VITAMIN D) 2000 UNITS tablet Take 2,000 Units by mouth at bedtime.     . clindamycin (CLEOCIN) 300 MG capsule Take 300 mg by mouth. Take 2 capsules by mouth 1 hours prior to dental appointment    . Coenzyme Q10 (CO Q-10) 200 MG CAPS Take 200 mg by mouth at bedtime.     Marland Kitchen ELDERBERRY PO Place under the tongue daily.    . fluticasone (FLONASE) 50 MCG/ACT nasal spray INSTILL 2 SPRAYS IN EACH NOSTRIL EVERY DAY (Patient taking differently: Place 1 spray into both nostrils daily as needed for allergies. ) 48 g 3  . folic acid  (FOLVITE) A999333 MCG tablet Take 400 mcg by mouth at bedtime.     . furosemide (LASIX) 20 MG tablet Take 1 tablet (20 mg total) by mouth daily. 90 tablet 3  . loratadine (CLARITIN) 10 MG tablet Take 10 mg by mouth daily as needed for allergies.    Marland Kitchen losartan (COZAAR) 100 MG tablet Take 100 mg by mouth daily.    . methocarbamol (ROBAXIN) 500 MG tablet Take 1 tablet (500 mg total) by mouth every 8 (eight) hours as needed for muscle spasms. 50 tablet 1  . omeprazole (PRILOSEC) 20 MG capsule Take 20 mg by mouth daily as needed (acid reflux).    Marland Kitchen oxyCODONE (OXY IR/ROXICODONE) 5 MG immediate release tablet Take 1-2 tablets (5-10 mg total) by mouth every 4 (four) hours as needed for moderate pain (pain score 4-6). 30 tablet 0  . pravastatin (PRAVACHOL) 20 MG tablet Take 20 mg by mouth at bedtime.     . vitamin B-12 (CYANOCOBALAMIN) 1000 MCG tablet Take 2,000 mcg by mouth every evening.      No current facility-administered medications for this visit.     Past Medical History:  Diagnosis Date  . Aortic stenosis   . Arthritis   . Bicuspid aortic valve   . Cervical lymphadenopathy   . Chronic diastolic congestive heart failure ([place])   . H/O bronchitis 07/2013  . Heart murmur   . Hepatitis    JAUNDICE  1960  . Hyperlipemia   . Hypertension   . Immature cataract of both eyes   .  PONV (postoperative nausea and vomiting)    with gallbladder surgery , reports no issues with valve surgery   . 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 ABDOMINAL HYSTERECTOMY  1985  . AORTIC VALVE REPLACEMENT N/A 09/17/2016   Procedure: MINIMALLY INVASIVE AORTIC VALVE REPLACEMENT (AVR) THROUGH PARTIAL STERNOTOMY USING A 21MM EDWARDS INTUITY ELITE VALVE;  Surgeon: Rexene Alberts, MD;  Location: [facility];  Service: Open Heart  Surgery;  Laterality: N/A;  . [place]   rt hand  . CHOLECYSTECTOMY  01/03/2013   Procedure: LAPAROSCOPIC CHOLECYSTECTOMY attempted cholangiogram;  Surgeon: Ralene Ok, MD;  Location: [place];  Service: General;;  . DILATION AND CURETTAGE OF UTERUS     several  . HAMMER TOE SURGERY  10/01/2011   left toe  . LYMPH NODE BIOPSY  06/14/2012   Procedure: LYMPH NODE BIOPSY;  Surgeon: Haywood Lasso, MD;  Location: [facility];  Service: General;  Laterality: Left;  remove left neck lymph node  . TEE WITHOUT CARDIOVERSION N/A 09/17/2016   Procedure: TRANSESOPHAGEAL ECHOCARDIOGRAM (TEE);  Surgeon: Rexene Alberts, MD;  Location: [facility];  Service: Open Heart Surgery;  Laterality: N/A;  . TONSILLECTOMY    . TOTAL KNEE ARTHROPLASTY Right 10/29/2018   Procedure: TOTAL KNEE ARTHROPLASTY;  Surgeon: Sydnee Cabal, MD;  Location: [facility];  Service: Orthopedics;  Laterality: Right;  with adductor canal  . TUBAL LIGATION      Social History   Socioeconomic History  . Marital status: Widowed    Spouse name: Not on file  . Number of children: 1  . Years of education: Not on file  . Highest education level: Not on file  Occupational History  . Not on file  Tobacco Use  . Smoking status: Never Smoker  . Smokeless tobacco: Never Used  Substance and Sexual Activity  . Alcohol use: Yes    Comment: occ  . Drug use: No  . Sexual activity: Not Currently  Other Topics Concern  . Not on file  Social History Narrative  . Not on file   Social Determinants of Health   Financial Resource Strain:   . Difficulty of Paying Living Expenses:   Food Insecurity:   . Worried About Charity fundraiser in the Last Year:   . Arboriculturist in the Last Year:   Transportation Needs:   . Film/video editor (Medical):   Marland Kitchen Lack of Transportation (Non-Medical):   Physical Activity:   . Days of Exercise per Week:   . Minutes of Exercise per Session:   Stress:   . Feeling  of Stress :   Social Connections:   . Frequency of Communication with Friends and Family:   . Frequency of Social Gatherings with Friends and Family:   . Attends Religious Services:   . Active Member of Clubs or Organizations:   . Attends Archivist Meetings:   Marland Kitchen Marital Status:   Intimate Partner Violence:   . Fear of Current or Ex-Partner:   . Emotionally Abused:   Marland Kitchen Physically Abused:   . Sexually Abused:     Family History  Problem Relation Age of Onset  . Hypertension Mother   . Aortic stenosis Mother   . COPD Father   . Stroke Maternal Grandfather  ROS: no fevers or chills, productive cough, hemoptysis, dysphasia, odynophagia, melena, hematochezia, dysuria, hematuria, rash, seizure activity, orthopnea, PND, pedal edema, claudication. Remaining systems are negative.  Physical Exam: Well-developed well-nourished in no acute distress.  Skin is warm and dry.  HEENT is normal.  Neck is supple.  Chest is clear to auscultation with normal expansion.  Cardiovascular exam is regular rate and rhythm.  Abdominal exam nontender or distended. No masses palpated. Extremities show no edema. neuro grossly intact  ECG-sinus rhythm at a rate of 71, RV conduction delay, left axis deviation.  Personally reviewed  A/P  1 status post aortic valve replacement-continue SBE prophylaxis.  Patient is doing well with no dyspnea.  2 hypertension-patient's blood pressure is controlled.  Continue present medications and follow.  3 hyperlipidemia-continue statin.  Labs are followed by primary care.  4 palpitations-symptoms are well controlled.  Continue beta-blocker.  Kirk Ruths, MD

## 2019-10-25 ENCOUNTER — Encounter: Payer: Self-pay | Admitting: Cardiology

## 2019-10-25 ENCOUNTER — Ambulatory Visit (INDEPENDENT_AMBULATORY_CARE_PROVIDER_SITE_OTHER): Payer: Medicare Other | Admitting: Cardiology

## 2019-10-25 ENCOUNTER — Other Ambulatory Visit: Payer: Self-pay

## 2019-10-25 VITALS — BP 120/83 | HR 71 | Temp 98.2°F | Ht 65.0 in | Wt 198.2 lb

## 2019-10-25 DIAGNOSIS — I10 Essential (primary) hypertension: Secondary | ICD-10-CM | POA: Diagnosis not present

## 2019-10-25 DIAGNOSIS — E7800 Pure hypercholesterolemia, unspecified: Secondary | ICD-10-CM

## 2019-10-25 DIAGNOSIS — Z952 Presence of prosthetic heart valve: Secondary | ICD-10-CM

## 2019-10-25 NOTE — Patient Instructions (Signed)

## 2020-10-23 NOTE — Progress Notes (Signed)
HPI: FU bicuspid aortic valve and severe aortic stenosis s/p AVR.Echo1/11/18 showednormal LV systolic function, mild left atrial enlargement, mild mitral regurgitation, bicuspid aortic valve with trace aortic insufficiency and severe aortic stenosis with mean gradient 43 mmHg. CTA 2/18 showed no TAA; LLL pulmonary nodule; fu 12 months.Patient had cardiac catheterization 08/14/2016 showed no coronary disease. Carotid Dopplers April 2018 showed no significant stenosis. Patient had aortic valve replacement with a pericardial tissue valve in April 2018.Postoperative echocardiogram June 2018 showed vigorous LV function, mild diastolic dysfunction, bioprosthetic aortic valve with no aortic stenosis or aortic insufficiency and mild tricuspid regurgitation. Follow-up chest CT January 2019 showed no nodule. Since last seen,  she denies dyspnea, chest pain, palpitations or syncope.  Current Outpatient Medications  Medication Sig Dispense Refill  . acetaminophen (TYLENOL) 650 MG CR tablet Take 650 mg by mouth every 8 (eight) hours as needed for pain. Has stopped prior to procedure    . B Complex-C (B-COMPLEX WITH VITAMIN C) tablet Take 1 tablet by mouth every evening.    . Calcium Carbonate-Vitamin D3 600-400 MG-UNIT TABS Take 1 tablet by mouth at bedtime.    . carvedilol (COREG) 6.25 MG tablet Take 6.25 mg by mouth 2 (two) times daily with a meal.     . Cholecalciferol (VITAMIN D) 2000 UNITS tablet Take 2,000 Units by mouth at bedtime.     . clindamycin (CLEOCIN) 300 MG capsule Take 300 mg by mouth. Take 2 capsules by mouth 1 hours prior to dental appointment    . Coenzyme Q10 (CO Q-10) 200 MG CAPS Take 200 mg by mouth at bedtime.     . fluticasone (FLONASE) 50 MCG/ACT nasal spray INSTILL 2 SPRAYS IN EACH NOSTRIL EVERY DAY (Patient taking differently: Place 1 spray into both nostrils daily as needed for allergies.) 48 g 3  . folic acid (FOLVITE) 086 MCG tablet Take 400 mcg by mouth at bedtime.      . furosemide (LASIX) 20 MG tablet Take 1 tablet (20 mg total) by mouth daily. 90 tablet 3  . loratadine (CLARITIN) 10 MG tablet Take 10 mg by mouth daily as needed for allergies.    Marland Kitchen losartan (COZAAR) 100 MG tablet Take 100 mg by mouth daily.    . methocarbamol (ROBAXIN) 500 MG tablet Take 1 tablet (500 mg total) by mouth every 8 (eight) hours as needed for muscle spasms. 50 tablet 1  . omeprazole (PRILOSEC) 20 MG capsule Take 20 mg by mouth daily as needed (acid reflux).    Marland Kitchen oxyCODONE (OXY IR/ROXICODONE) 5 MG immediate release tablet Take 1-2 tablets (5-10 mg total) by mouth every 4 (four) hours as needed for moderate pain (pain score 4-6). 30 tablet 0  . pravastatin (PRAVACHOL) 20 MG tablet Take 20 mg by mouth at bedtime.     . vitamin B-12 (CYANOCOBALAMIN) 1000 MCG tablet Take 2,000 mcg by mouth every evening.     Marland Kitchen ELDERBERRY PO Place under the tongue daily. (Patient not taking: Reported on 11/06/2020)     No current facility-administered medications for this visit.     Past Medical History:  Diagnosis Date  . Aortic stenosis   . Arthritis   . Bicuspid aortic valve   . Cervical lymphadenopathy   . Chronic diastolic congestive heart failure ([place])   . H/O bronchitis 07/2013  . Heart murmur   . Hepatitis    JAUNDICE  1960  . Hyperlipemia   . Hypertension   . Immature cataract of both eyes   .  PONV (postoperative nausea and vomiting)    with gallbladder surgery , reports no issues with valve surgery   . 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 ABDOMINAL HYSTERECTOMY  1985  . AORTIC VALVE REPLACEMENT N/A 09/17/2016   Procedure: MINIMALLY INVASIVE AORTIC VALVE REPLACEMENT (AVR) THROUGH PARTIAL STERNOTOMY USING A 21MM EDWARDS INTUITY ELITE VALVE;  Surgeon: Rexene Alberts, MD;  Location: [facility];  Service: Open Heart Surgery;  Laterality: N/A;  . [place]   rt hand  . CHOLECYSTECTOMY  01/03/2013   Procedure: LAPAROSCOPIC CHOLECYSTECTOMY attempted cholangiogram;  Surgeon: Ralene Ok, MD;  Location: [facility];  Service: General;;  . DILATION AND CURETTAGE OF UTERUS     several  . HAMMER TOE SURGERY  10/01/2011   left toe  . LYMPH NODE BIOPSY  06/14/2012   Procedure: LYMPH NODE BIOPSY;  Surgeon: Haywood Lasso, MD;  Location: [facility];  Service: General;  Laterality: Left;  remove left neck lymph node  . TEE WITHOUT CARDIOVERSION N/A 09/17/2016   Procedure: TRANSESOPHAGEAL ECHOCARDIOGRAM (TEE);  Surgeon: Rexene Alberts, MD;  Location: [facility];  Service: Open Heart Surgery;  Laterality: N/A;  . TONSILLECTOMY    . TOTAL KNEE ARTHROPLASTY Right 10/29/2018   Procedure: TOTAL KNEE ARTHROPLASTY;  Surgeon: Sydnee Cabal, MD;  [facility];  Service: Orthopedics;  Laterality: Right;  with adductor canal  . TUBAL LIGATION      Social History   Socioeconomic History  . Marital status: Widowed    Spouse name: Not on file  . Number of children: 1  . Years of education: Not on file  . Highest education level: Not on file  Occupational History  . Not on file  Tobacco Use  . Smoking status: Never Smoker  . Smokeless tobacco: Never Used  Vaping Use  . Vaping Use: Never used  Substance and Sexual Activity  . Alcohol use: Yes    Comment: occ  . Drug use: No  . Sexual activity: Not Currently  Other Topics Concern  . Not on file  Social History Narrative  . Not on file   Social Determinants of Health   Financial Resource Strain: Not on file  Food Insecurity: Not on file  Transportation Needs: Not on file  Physical Activity: Not on file  Stress: Not on file  Social Connections: Not on file  Intimate Partner Violence: Not on file    Family History  Problem Relation Age of Onset  . Hypertension Mother   . Aortic stenosis Mother   .  COPD Father   . Stroke Maternal Grandfather     ROS: no fevers or chills, productive cough, hemoptysis, dysphasia, odynophagia, melena, hematochezia, dysuria, hematuria, rash, seizure activity, orthopnea, PND, pedal edema, claudication. Remaining systems are negative.  Physical Exam: Well-developed well-nourished in no acute distress.  Skin is warm and dry.  HEENT is normal.  Neck is supple.  Chest is clear to auscultation with normal expansion.  Cardiovascular exam is regular rate and rhythm.  2/6 systolic murmur left sternal border.  No diastolic murmur. Abdominal exam nontender or distended. No masses palpated. Extremities show no edema. neuro grossly intact  ECG-normal sinus rhythm at a rate of 73, RV conduction delay, no ST changes.  Personally reviewed  A/P  1 previous aortic  valve replacement-continue SBE prophylaxis.  2 hypertension-patient's blood pressure is controlled today.  Continue present medical regimen.  3 hyperlipidemia-continue statin.  4 palpitations-continue beta-blocker at present dose.  Symptoms are reasonably well controlled.  Kirk Ruths, MD

## 2020-11-06 ENCOUNTER — Encounter: Payer: Self-pay | Admitting: Cardiology

## 2020-11-06 ENCOUNTER — Ambulatory Visit (INDEPENDENT_AMBULATORY_CARE_PROVIDER_SITE_OTHER): Payer: Medicare Other | Admitting: Cardiology

## 2020-11-06 ENCOUNTER — Other Ambulatory Visit: Payer: Self-pay

## 2020-11-06 VITALS — BP 124/62 | HR 73 | Ht 65.0 in | Wt 197.6 lb

## 2020-11-06 DIAGNOSIS — I10 Essential (primary) hypertension: Secondary | ICD-10-CM | POA: Diagnosis not present

## 2020-11-06 DIAGNOSIS — Z952 Presence of prosthetic heart valve: Secondary | ICD-10-CM | POA: Diagnosis not present

## 2020-11-06 DIAGNOSIS — E7800 Pure hypercholesterolemia, unspecified: Secondary | ICD-10-CM | POA: Diagnosis not present

## 2020-11-06 NOTE — Patient Instructions (Signed)

## 2021-11-14 ENCOUNTER — Ambulatory Visit (INDEPENDENT_AMBULATORY_CARE_PROVIDER_SITE_OTHER): Payer: Medicare Other | Admitting: Nurse Practitioner

## 2021-11-14 ENCOUNTER — Encounter: Payer: Self-pay | Admitting: Nurse Practitioner

## 2021-11-14 VITALS — BP 118/64 | HR 69 | Ht 65.0 in | Wt 198.2 lb

## 2021-11-14 DIAGNOSIS — R002 Palpitations: Secondary | ICD-10-CM

## 2021-11-14 DIAGNOSIS — I350 Nonrheumatic aortic (valve) stenosis: Secondary | ICD-10-CM | POA: Diagnosis not present

## 2021-11-14 DIAGNOSIS — Z952 Presence of prosthetic heart valve: Secondary | ICD-10-CM

## 2021-11-14 DIAGNOSIS — E782 Mixed hyperlipidemia: Secondary | ICD-10-CM

## 2021-11-14 DIAGNOSIS — I10 Essential (primary) hypertension: Secondary | ICD-10-CM | POA: Diagnosis not present

## 2021-11-14 NOTE — Progress Notes (Signed)
Office Visit    Patient Name: Jasmin Allen Date of Encounter: 11/14/2021  Primary Care Provider:  Lonia Mad, MD Primary Cardiologist:  Kirk Ruths, MD  Chief Complaint    81 year old female with a history of severe aortic stenosis s/p AVR in 2018, palpitations, hypertension, hyperlipidemia, and arthritis who presents for follow-up related to aortic stenosis.  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   21 mm Edwards Intuity rapid deployment bovine pericardial tissue valve via partial upper mini sternotomy approach   Past Surgical History:  Procedure Laterality Date   ABDOMINAL HYSTERECTOMY  1985   AORTIC VALVE REPLACEMENT N/A 09/17/2016   Procedure: MINIMALLY INVASIVE AORTIC VALVE REPLACEMENT (AVR) THROUGH PARTIAL STERNOTOMY USING A 21MM EDWARDS INTUITY ELITE VALVE;  Surgeon: Rexene Alberts, MD;  Location: [facility];  Service: Open Heart Surgery;  Laterality: N/A;   CARPAL TUNNEL RELEASE  1985   rt hand   CHOLECYSTECTOMY  01/03/2013   Procedure: LAPAROSCOPIC CHOLECYSTECTOMY attempted cholangiogram;  Surgeon: Ralene Ok, MD;  Location: [facility];  Service: General;;   DILATION AND CURETTAGE OF UTERUS     several   HAMMER TOE SURGERY  10/01/2011   left toe   LYMPH NODE BIOPSY  06/14/2012   Procedure: LYMPH NODE BIOPSY;  Surgeon: Haywood Lasso, MD;  Location: [facility];  Service: General;  Laterality: Left;  remove left neck lymph node   TEE WITHOUT CARDIOVERSION N/A 09/17/2016    Procedure: TRANSESOPHAGEAL ECHOCARDIOGRAM (TEE);  Surgeon: Rexene Alberts, MD;  Location: [facility];  Service: Open Heart Surgery;  Laterality: N/A;   TONSILLECTOMY     TOTAL KNEE ARTHROPLASTY Right 10/29/2018   Procedure: TOTAL KNEE ARTHROPLASTY;  Surgeon: Sydnee Cabal, MD;  [facility];  Service: Orthopedics;  Laterality: Right;  with adductor canal   TUBAL LIGATION      Allergies  Allergies  Allergen Reactions   Penicillins Swelling    Has patient had a PCN reaction causing immediate rash, facial/tongue/throat swelling, SOB or lightheadedness with hypotension: Unknown Has patient had a PCN reaction causing severe rash involving mucus membranes or skin necrosis: Unknown Has patient had a PCN reaction that required hospitalization No Has patient had a PCN reaction occurring within the last 10 years: No If all of the above answers are "NO", then may proceed with Cephalosporin use.    Sulfa Antibiotics Hives    History of Present Illness    81 year old female with the above past medical history including severe aortic stenosis s/p AVR in 2018, palpitations, hypertension, hyperlipidemia, and arthritis.  She has a history of bicuspid aortic valve with severe aortic stenosis.  Echocardiogram in January 2018 showed normal LV systolic function, mild left atrial enlargement, mild mitral valve regurgitation, bicuspid aortic valve with trace aortic insufficiency and severe aortic stenosis with mean gradient 43 mmHg.  CTA in 2018 showed no TAA, L LL pulmonary nodule was noted.  Cardiac catheterization in March 2018 showed normal coronary arteries.  Carotid Dopplers in 2018 showed no significant stenosis.  She underwent aortic valve replacement with pericardial tissue valve in April 2018.  Postoperative echocardiogram in June 2018 showed vigorous LV function, mild diastolic dysfunction with no aortic stenosis or aortic insufficiency and mild tricuspid valve regurgitation.  Follow-up CT chest in  January 2021 showed no evidence of pulmonary nodule.  Additionally ,she has a history of palpitations controlled with beta-blocker.  She was last seen in the office on 11/06/2020 and was doing well from a cardiac standpoint.    She presents today for follow-up.  Since her last visit she has been stable overall from a cardiac standpoint.  She denies worsening edema, dyspnea, PND, orthopnea, dizziness, palpitations, denies symptoms concerning for angina.  Note that she has had over the past 6 months and intermittent "hot sensation on her face."  This lasts for approximately 1 minute at a time, occurs spontaneously, sometimes associated with emotional stress.  Her symptoms improve with fanning or a cold drink of water.  No other associated symptoms. Otherwise, she reports feeling well denies any additional concerns today.  Home Medications    Current Outpatient Medications  Medication Sig Dispense Refill   acetaminophen (TYLENOL) 650 MG CR tablet Take 650 mg by mouth every 8 (eight) hours as needed for pain. Has stopped prior to procedure     B Complex-C (B-COMPLEX WITH VITAMIN C) tablet Take 1 tablet by mouth every evening.     Calcium Carbonate-Vitamin D3 600-400 MG-UNIT TABS Take 1 tablet by mouth at bedtime.     carvedilol (COREG) 6.25 MG tablet Take 6.25 mg by mouth 2 (two) times daily with a meal.      Cholecalciferol (VITAMIN D) 2000 UNITS tablet Take 2,000 Units by mouth at bedtime.      clindamycin (CLEOCIN) 300 MG capsule Take 300 mg by mouth. Take 2 capsules by mouth 1 hours prior to dental appointment     Coenzyme Q10 (CO Q-10) 200 MG CAPS Take 200 mg by mouth at bedtime.      fluticasone (FLONASE) 50 MCG/ACT nasal spray INSTILL 2 SPRAYS IN EACH NOSTRIL EVERY DAY (Patient taking differently: Place 1 spray into both nostrils daily as needed for allergies.) 48 g 3   folic acid (FOLVITE) 001 MCG tablet Take 400 mcg by mouth at bedtime.      furosemide (LASIX) 20 MG tablet Take 1 tablet (20 mg  total) by mouth daily. 90 tablet 3   loratadine (CLARITIN) 10 MG tablet Take 10 mg by mouth daily as needed for allergies.     losartan (COZAAR) 100 MG tablet Take 100 mg by mouth daily.     omeprazole (PRILOSEC) 20 MG capsule Take 20 mg by mouth daily as needed (acid reflux).     pravastatin (PRAVACHOL) 20 MG tablet Take 20 mg by mouth at bedtime.      vitamin B-12 (CYANOCOBALAMIN) 1000 MCG tablet Take 2,000 mcg by mouth every evening.      No current facility-administered medications for this visit.     Review of Systems    She denies chest pain, palpitations, dyspnea, pnd, orthopnea, n, v, dizziness, syncope, edema, weight gain, or early satiety. All other systems reviewed and are otherwise negative except as noted above.   Physical Exam    VS:  BP 118/64   Pulse 69   Ht '5\' 5"'$  (1.651 m)   Wt 198 lb 3.2 oz (89.9 kg)  SpO2 99%   BMI 32.98 kg/m   GEN: Well nourished, well developed, in no acute distress. HEENT: normal. Neck: Supple, no JVD, carotid bruits, or masses. Cardiac: RRR, aortic click, no murmurs, rubs, or gallops. No clubbing, cyanosis, edema.  Radials/DP/PT 2+ and equal bilaterally.  Respiratory:  Respirations regular and unlabored, clear to auscultation bilaterally. GI: Soft, nontender, nondistended, BS + x 4. MS: no deformity or atrophy. Skin: warm and dry, no rash. Neuro:  Strength and sensation are intact. Psych: Normal affect.  Accessory Clinical Findings    ECG personally reviewed by me today -NSR, 69 bpm, LAD- no acute changes.  Lab Results  Component Value Date   WBC 7.3 10/31/2018   HGB 8.9 (L) 10/31/2018   HCT 26.3 (L) 10/31/2018   MCV 102.3 (H) 10/31/2018   PLT 126 (L) 10/31/2018   Lab Results  Component Value Date   CREATININE 0.87 10/30/2018   BUN 13 10/30/2018   NA 137 10/30/2018   K 5.1 10/30/2018   CL 105 10/30/2018   CO2 25 10/30/2018   Lab Results  Component Value Date   ALT 16 03/05/2017   AST 20 03/05/2017   ALKPHOS 100  03/05/2017   BILITOT 0.9 03/05/2017   No results found for: "CHOL", "HDL", "LDLCALC", "LDLDIRECT", "TRIG", "CHOLHDL"  Lab Results  Component Value Date   HGBA1C 5.0 09/15/2016    Assessment & Plan    1. Severe aortic stenosis: S/p aortic valve replacement with pericardial tissue valve in April 2018. Postoperative echocardiogram in June 2018 showed vigorous LV function, mild diastolic dysfunction with no aortic stenosis or aortic insufficiency and mild tricuspid valve regurgitation.  She has occasional bilateral lower extremity edema, she takes Lasix 20 mg  daily with 40 mg daily on Monday, Wednesday, Friday.  She denies dyspnea, dizziness.  Over the past 6 months she gets an intermittent "hot sensation on her face" last for approximately a minute at a time, resolved spontaneously.  She denies any associated symptoms.  She does state that this sometimes occurs in the setting of emotional stress.  She is concerned that this could be related to her heart.  She denies any exertional symptoms concerning for angina.  No indication for ischemic evaluation at this time.  Discussed possibility of repeat carotid Dopplers, however, patient declines at this time.  Will repeat echocardiogram for routine monitoring of valve.  Continue SBE prophylaxis.       2. Hypertension: BP well controlled. Continue current antihypertensive regimen.   3. Hyperlipidemia: LDL was 77 on 11/08/2021.  Continue pravastatin.  4. Palpitations: Stable on beta-blocker.    5. Disposition: Follow-up in 1 year.   Lenna Sciara, NP 11/14/2021, 2:01 PM

## 2021-11-14 NOTE — Patient Instructions (Signed)
Medication Instructions:  Your physician recommends that you continue on your current medications as directed. Please refer to the Current Medication list given to you today.  *If you need a refill on your cardiac medications before your next appointment, please call your pharmacy*  Lab Work: NONE ordered at this time of appointment   If you have labs (blood work) drawn today and your tests are completely normal, you will receive your results only by: [place] (if you have MyChart) OR A paper copy in the mail If you have any lab test that is abnormal or we need to change your treatment, we will call you to review the results.  Testing/Procedures: Your physician has requested that you have an echocardiogram. Echocardiography is a painless test that uses sound waves to create images of your heart. It provides your doctor with information about the size and shape of your heart and how well your heart's chambers and valves are working. This procedure takes approximately one hour. There are no restrictions for this procedure.  Follow-Up: At [facility], you and your health needs are our priority.  As part of our continuing mission to provide you with exceptional heart care, we have created designated Provider Care Teams.  These Care Teams include your primary Cardiologist (physician) and Advanced Practice Providers (APPs -  Physician Assistants and Nurse Practitioners) who all work together to provide you with the care you need, when you need it.   Your next appointment:   1 year(s)  The format for your next appointment:   In Person  Provider:   Kirk Ruths, MD     Other Instructions   Important Information About Sugar

## 2021-11-21 ENCOUNTER — Ambulatory Visit (HOSPITAL_COMMUNITY)
Admission: RE | Admit: 2021-11-21 | Discharge: 2021-11-21 | Disposition: A | Discharge status: Home / Self Care | Payer: Medicare Other | Source: Ambulatory Visit | Attending: Nurse Practitioner | Admitting: Nurse Practitioner

## 2021-11-21 DIAGNOSIS — Z952 Presence of prosthetic heart valve: Secondary | ICD-10-CM

## 2021-11-21 DIAGNOSIS — I350 Nonrheumatic aortic (valve) stenosis: Secondary | ICD-10-CM | POA: Diagnosis present

## 2021-11-21 LAB — ECHOCARDIOGRAM COMPLETE
AR max vel: 1.3 cm2
AV Area VTI: 1.39 cm2
AV Area mean vel: 1.43 cm2
AV Mean grad: 9 mmHg
AV Peak grad: 17.3 mmHg
Ao pk vel: 2.08 m/s
Area-P 1/2: 3.17 cm2
S' Lateral: 2.2 cm

## 2021-11-21 NOTE — Progress Notes (Signed)
*  PRELIMINARY RESULTS* Echocardiogram 2D Echocardiogram has been performed.  Jasmin Allen 11/21/2021, 3:57 PM

## 2021-11-26 ENCOUNTER — Telehealth: Payer: Self-pay

## 2021-11-26 ENCOUNTER — Telehealth: Payer: Self-pay | Admitting: Cardiology

## 2021-11-26 ENCOUNTER — Encounter: Payer: Self-pay | Admitting: Cardiology

## 2021-11-26 NOTE — Telephone Encounter (Signed)
Follow Up:     Patient was returning Jasmin Allen's call from today.

## 2022-11-26 NOTE — Progress Notes (Signed)
HPI: FU bicuspid aortic valve and severe aortic stenosis s/p AVR. Echo 06/12/16 showed normal LV systolic function, mild left atrial enlargement, mild mitral regurgitation, bicuspid aortic valve with trace aortic insufficiency and severe aortic stenosis with mean gradient 43 mmHg. Patient had cardiac catheterization 08/14/2016 showed no coronary disease. Carotid Dopplers April 2018 showed no significant stenosis. Patient had aortic valve replacement with a pericardial tissue valve in April 2018.  Postoperative echocardiogram June 2018 showed vigorous LV function, mild diastolic dysfunction, bioprosthetic aortic valve with no aortic stenosis or aortic insufficiency and mild tricuspid regurgitation. Echocardiogram June 2023 showed ejection fraction 65 to 70%, mild left ventricular hypertrophy, status post aortic valve replacement with normal function (mean gradient 9 mmHg).  Since last seen, she denies dyspnea, exertional chest pain or syncope.  She has noticed worsening bilateral lower extremity edema.  Current Outpatient Medications  Medication Sig Dispense Refill   acetaminophen (TYLENOL) 650 MG CR tablet Take 650 mg by mouth every 8 (eight) hours as needed for pain. Has stopped prior to procedure     B Complex-C (B-COMPLEX WITH VITAMIN C) tablet Take 1 tablet by mouth every evening.     Calcium Carbonate-Vitamin D3 600-400 MG-UNIT TABS Take 1 tablet by mouth at bedtime.     carvedilol (COREG) 6.25 MG tablet Take 6.25 mg by mouth 2 (two) times daily with a meal.      Cholecalciferol (VITAMIN D) 2000 UNITS tablet Take 2,000 Units by mouth at bedtime.      clindamycin (CLEOCIN) 300 MG capsule Take 300 mg by mouth. Take 2 capsules by mouth 1 hours prior to dental appointment     Coenzyme Q10 (CO Q-10) 200 MG CAPS Take 200 mg by mouth at bedtime.      fluticasone (FLONASE) 50 MCG/ACT nasal spray INSTILL 2 SPRAYS IN EACH NOSTRIL EVERY DAY (Patient taking differently: Place 1 spray into both nostrils  daily as needed for allergies.) 48 g 3   folic acid (FOLVITE) 400 MCG tablet Take 400 mcg by mouth at bedtime.      furosemide (LASIX) 20 MG tablet Take 1 tablet (20 mg total) by mouth daily. 90 tablet 3   loratadine (CLARITIN) 10 MG tablet Take 10 mg by mouth daily as needed for allergies.     losartan (COZAAR) 100 MG tablet Take 100 mg by mouth daily.     omeprazole (PRILOSEC) 20 MG capsule Take 20 mg by mouth daily as needed (acid reflux).     pravastatin (PRAVACHOL) 20 MG tablet Take 20 mg by mouth at bedtime.      vitamin B-12 (CYANOCOBALAMIN) 1000 MCG tablet Take 2,000 mcg by mouth every evening.      No current facility-administered medications for this visit.     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   21 mm Edwards Intuity rapid deployment bovine pericardial tissue valve via partial upper mini sternotomy approach    Past Surgical History:  Procedure Laterality Date   ABDOMINAL HYSTERECTOMY  1985   AORTIC VALVE REPLACEMENT N/A 09/17/2016   Procedure: MINIMALLY INVASIVE AORTIC VALVE REPLACEMENT (AVR) THROUGH PARTIAL STERNOTOMY USING A EDWARDS INTUITY ELITE VALVE;  Surgeon: Purcell Nails, MD;  Location: [facility] OR;  Service: Open Heart Surgery;  Laterality: N/A;   CARPAL TUNNEL RELEASE  1985   rt hand   CHOLECYSTECTOMY  01/03/2013   Procedure: LAPAROSCOPIC CHOLECYSTECTOMY attempted cholangiogram;  Surgeon: Axel Filler, MD;  Location: [facility] OR;  Service: General;;   DILATION AND CURETTAGE OF UTERUS     several   HAMMER TOE  SURGERY  10/01/2011   left toe   LYMPH NODE BIOPSY  06/14/2012   Procedure: LYMPH NODE BIOPSY;  Surgeon: Currie Paris, MD;  Location: [facility];  Service: General;  Laterality: Left;  remove left neck lymph node   TEE WITHOUT CARDIOVERSION N/A 09/17/2016   Procedure: TRANSESOPHAGEAL ECHOCARDIOGRAM (TEE);  Surgeon: Purcell Nails, MD;  Location: [facility] OR;  Service: Open Heart Surgery;  Laterality: N/A;   TONSILLECTOMY     TOTAL KNEE ARTHROPLASTY Right 10/29/2018   Procedure: TOTAL KNEE ARTHROPLASTY;  Surgeon: Eugenia Mcalpine, MD;  Location: [facility]S;  Service: Orthopedics;  Laterality: Right;  with adductor canal   TUBAL LIGATION      Social History   Socioeconomic History   Marital status: Widowed    Spouse name: Not on file   Number of children: 1   Years of education: Not on file   Highest education level: Not on file  Occupational History   Not on file  Tobacco Use   Smoking status: Never   Smokeless tobacco: Never  Vaping Use   Vaping Use: Never used  Substance and Sexual Activity   Alcohol use: Yes    Comment: occ   Drug use: No   Sexual activity: Not Currently  Other Topics Concern   Not on file  Social History Narrative   Not on file   Social Determinants of Health   Financial Resource Strain: Not on file  Food Insecurity: Not on file  Transportation Needs: Not on file  Physical Activity: Not on file  Stress: Not on file  Social Connections: Not on file  Intimate Partner Violence: Not on file    Family History  Problem Relation Age of Onset   Hypertension Mother    Aortic stenosis Mother    COPD Father    Stroke Maternal Grandfather     ROS: no fevers or chills, productive cough, hemoptysis, dysphasia, odynophagia, melena, hematochezia, dysuria, hematuria, rash, seizure activity, orthopnea, PND, pedal edema, claudication. Remaining systems are negative.  Physical Exam: Well-developed well-nourished in no acute distress.  Skin is warm  and dry.  HEENT is normal.  Neck is supple.  Chest is clear to auscultation with normal expansion.  Cardiovascular exam is regular rate and rhythm.  Abdominal exam nontender or distended. No masses palpated. Extremities show no edema. neuro grossly intact  EKG Interpretation Date/Time:  Monday December 08 2022 09:15:46 EDT Ventricular Rate:  72 PR Interval:  136 QRS Duration:  82 QT Interval:  394 QTC Calculation: 431 R Axis:   -13  Text Interpretation: Normal sinus rhythm Normal ECG When compared with ECG of 18-Oct-2018 11:42, No significant change was found Confirmed by Olga Millers ([id number]) on 12/08/2022 9:18:39 AM    A/P  1 status post aortic valve replacement-most recent echocardiogram showed normally functioning valve.  Continue SBE prophylaxis.  2 hypertension-blood pressure controlled.  Continue present medications and follow-up.  3 hyperlipidemia-continue statin.  Check lipids and liver.  4 history of palpitations-symptoms are controlled.  Continue beta-blocker.  5 lower extremity edema-increased compared to previous.  Will increase Lasix to 40 mg daily.  Check potassium and renal function in 1 week.  We also discussed the importance of feet elevation and she will try compression hose.  Olga Millers, MD

## 2022-12-08 ENCOUNTER — Ambulatory Visit: Payer: Medicare Other | Attending: Cardiology | Admitting: Cardiology

## 2022-12-08 ENCOUNTER — Encounter: Payer: Self-pay | Admitting: Cardiology

## 2022-12-08 VITALS — BP 140/82 | HR 81 | Ht 65.0 in | Wt 201.6 lb

## 2022-12-08 DIAGNOSIS — Z952 Presence of prosthetic heart valve: Secondary | ICD-10-CM | POA: Diagnosis present

## 2022-12-08 DIAGNOSIS — R002 Palpitations: Secondary | ICD-10-CM | POA: Diagnosis present

## 2022-12-08 DIAGNOSIS — I10 Essential (primary) hypertension: Secondary | ICD-10-CM | POA: Insufficient documentation

## 2022-12-08 DIAGNOSIS — E782 Mixed hyperlipidemia: Secondary | ICD-10-CM | POA: Diagnosis present

## 2022-12-08 MED ORDER — FUROSEMIDE 20 MG PO TABS: 40.0000 mg | ORAL_TABLET | Freq: Every day | ORAL | 3 refills | Status: DC

## 2022-12-08 NOTE — Patient Instructions (Signed)
Medication Instructions:   INCREASE FUROSEMIDE TO 40 MG ONCE DAILY= 2 OF THE 20 MG TABLETS ONCE DAILY  *If you need a refill on your cardiac medications before your next appointment, please call your pharmacy*   Lab Work:  Your physician recommends that you return for lab work in: [facility]  If you have labs (blood work) drawn today and your tests are completely normal, you will receive your results only by: MyChart Message (if you have MyChart) OR A paper copy in the mail If you have any lab test that is abnormal or we need to change your treatment, we will call you to review the results.   Follow-Up: At [facility], you and your health needs are our priority.  As part of our continuing mission to provide you with exceptional heart care, we have created designated Provider Care Teams.  These Care Teams include your primary Cardiologist (physician) and Advanced Practice Providers (APPs -  Physician Assistants and Nurse Practitioners) who all work together to provide you with the care you need, when you need it.  We recommend signing up for the patient portal called "MyChart".  Sign up information is provided on this After Visit Summary.  MyChart is used to connect with patients for Virtual Visits (Telemedicine).  Patients are able to view lab/test results, encounter notes, upcoming appointments, etc.  Non-urgent messages can be sent to your provider as well.   To learn more about what you can do with MyChart, go to [email or web address].    Your next appointment:   12 month(s)  Provider:   Olga Millers, MD

## 2022-12-23 ENCOUNTER — Other Ambulatory Visit (HOSPITAL_COMMUNITY)
Admission: RE | Admit: 2022-12-23 | Discharge: 2022-12-23 | Disposition: A | Discharge status: Home / Self Care | Payer: Medicare Other | Source: Ambulatory Visit | Attending: Internal Medicine | Admitting: Internal Medicine

## 2022-12-23 ENCOUNTER — Other Ambulatory Visit (HOSPITAL_COMMUNITY)
Admission: RE | Admit: 2022-12-23 | Discharge: 2022-12-23 | Disposition: A | Discharge status: Home / Self Care | Payer: Medicare Other | Source: Ambulatory Visit | Attending: Cardiology | Admitting: Cardiology

## 2022-12-23 DIAGNOSIS — R5383 Other fatigue: Secondary | ICD-10-CM | POA: Insufficient documentation

## 2022-12-23 DIAGNOSIS — E559 Vitamin D deficiency, unspecified: Secondary | ICD-10-CM | POA: Diagnosis not present

## 2022-12-23 DIAGNOSIS — M130 Polyarthritis, unspecified: Secondary | ICD-10-CM | POA: Insufficient documentation

## 2022-12-23 DIAGNOSIS — Z79899 Other long term (current) drug therapy: Secondary | ICD-10-CM | POA: Insufficient documentation

## 2022-12-23 LAB — SEDIMENTATION RATE: Sed Rate: 14 mm/hr (ref 0–22)

## 2022-12-23 LAB — CBC WITH DIFFERENTIAL/PLATELET
Abs Immature Granulocytes: 0.01 10*3/uL (ref 0.00–0.07)
Basophils Absolute: 0 10*3/uL (ref 0.0–0.1)
Basophils Relative: 1 %
Eosinophils Absolute: 0 10*3/uL (ref 0.0–0.5)
Eosinophils Relative: 0 %
HCT: 36.1 % (ref 36.0–46.0)
Hemoglobin: 12.7 g/dL (ref 12.0–15.0)
Immature Granulocytes: 0 %
Lymphocytes Relative: 35 %
Lymphs Abs: 1.5 10*3/uL (ref 0.7–4.0)
MCH: 33.8 pg (ref 26.0–34.0)
MCHC: 35.2 g/dL (ref 30.0–36.0)
MCV: 96 fL (ref 80.0–100.0)
Monocytes Absolute: 0.3 10*3/uL (ref 0.1–1.0)
Monocytes Relative: 6 %
Neutro Abs: 2.4 10*3/uL (ref 1.7–7.7)
Neutrophils Relative %: 58 %
Platelets: 163 10*3/uL (ref 150–400)
RBC: 3.76 MIL/uL — ABNORMAL LOW (ref 3.87–5.11)
RDW: 13.1 % (ref 11.5–15.5)
WBC: 4.3 10*3/uL (ref 4.0–10.5)
nRBC: 0 % (ref 0.0–0.2)

## 2022-12-23 LAB — COMPREHENSIVE METABOLIC PANEL
ALT: 19 U/L (ref 0–44)
AST: 21 U/L (ref 15–41)
Albumin: 4.3 g/dL (ref 3.5–5.0)
Alkaline Phosphatase: 98 U/L (ref 38–126)
Anion gap: 11 (ref 5–15)
BUN: 22 mg/dL (ref 8–23)
CO2: 28 mmol/L (ref 22–32)
Calcium: 9.1 mg/dL (ref 8.9–10.3)
Chloride: 99 mmol/L (ref 98–111)
Creatinine, Ser: 0.94 mg/dL (ref 0.44–1.00)
GFR, Estimated: >60 mL/min (ref >60)
Glucose, Bld: 123 mg/dL — ABNORMAL HIGH (ref 70–99)
Potassium: 4 mmol/L (ref 3.5–5.1)
Sodium: 138 mmol/L (ref 135–145)
Total Bilirubin: 1.3 mg/dL — ABNORMAL HIGH (ref 0.3–1.2)
Total Protein: 6.9 g/dL (ref 6.5–8.1)

## 2022-12-23 LAB — URINALYSIS, W/ REFLEX TO CULTURE (INFECTION SUSPECTED)
Bacteria, UA: NONE SEEN
Bilirubin Urine: NEGATIVE
Glucose, UA: NEGATIVE mg/dL
Hgb urine dipstick: NEGATIVE
Ketones, ur: NEGATIVE mg/dL
Nitrite: NEGATIVE
Protein, ur: NEGATIVE mg/dL
Specific Gravity, Urine: 1.016 (ref 1.005–1.030)
pH: 6 (ref 5.0–8.0)

## 2022-12-23 LAB — T4, FREE: Free T4: 1.1 ng/dL (ref 0.61–1.12)

## 2022-12-23 LAB — LIPID PANEL
Cholesterol: 147 mg/dL (ref 0–200)
HDL: 45 mg/dL (ref >40)
LDL Cholesterol: 77 mg/dL (ref 0–99)
Total CHOL/HDL Ratio: 3.3 RATIO
Triglycerides: 125 mg/dL (ref <150)
VLDL: 25 mg/dL (ref 0–40)

## 2022-12-23 LAB — TSH: TSH: 2.124 u[IU]/mL (ref 0.350–4.500)

## 2022-12-25 LAB — RHEUMATOID FACTOR: Rheumatoid fact SerPl-aCnc: <10 IU/mL (ref <14.0)

## 2023-10-30 ENCOUNTER — Other Ambulatory Visit: Payer: Self-pay | Admitting: Cardiology

## 2023-11-22 ENCOUNTER — Other Ambulatory Visit: Payer: Self-pay | Admitting: Cardiology

## 2023-12-01 NOTE — Progress Notes (Signed)
 HPI: FU bicuspid aortic valve and severe aortic stenosis s/p AVR. Echo 06/12/16 showed normal LV systolic function, mild left atrial enlargement, mild mitral regurgitation, bicuspid aortic valve with trace aortic insufficiency and severe aortic stenosis with mean gradient 43 mmHg. Patient had cardiac catheterization 08/14/2016 showed no coronary disease. Carotid Dopplers April 2018 showed no significant stenosis. Patient had aortic valve replacement with a pericardial tissue valve in April 2018.  Postoperative echocardiogram June 2018 showed vigorous LV function, mild diastolic dysfunction, bioprosthetic aortic valve with no aortic stenosis or aortic insufficiency and mild tricuspid regurgitation. Echocardiogram June 2023 showed ejection fraction 65 to 70%, mild left ventricular hypertrophy, status post aortic valve replacement with normal function (mean gradient 9 mmHg).  Since last seen, she has dyspnea with more vigorous activities but not routine activities.  No orthopnea or PND.  No chest pain or syncope.  Chronic mild pedal edema.  Current Outpatient Medications  Medication Sig Dispense Refill   acetaminophen  (TYLENOL ) 650 MG CR tablet Take 650 mg by mouth every 8 (eight) hours as needed for pain. Has stopped prior to procedure     B Complex-C (B-COMPLEX WITH VITAMIN C) tablet Take 1 tablet by mouth every evening.     Calcium Carbonate-Vitamin D3 600-400 MG-UNIT TABS Take 1 tablet by mouth at bedtime.     carvedilol  (COREG ) 6.25 MG tablet Take 6.25 mg by mouth 2 (two) times daily with a meal.      Cholecalciferol 1.25 MG (50000 UT) capsule Take 50,000 Units by mouth once a week.     clindamycin (CLEOCIN) 300 MG capsule Take 300 mg by mouth. Take 2 capsules by mouth 1 hours prior to dental appointment     Coenzyme Q10 (CO Q-10) 200 MG CAPS Take 200 mg by mouth at bedtime.      fluticasone  (FLONASE ) 50 MCG/ACT nasal spray INSTILL 2 SPRAYS IN EACH NOSTRIL EVERY DAY (Patient taking differently:  Place 1 spray into both nostrils daily as needed for allergies.) 48 g 3   folic acid  (FOLVITE ) 400 MCG tablet Take 400 mcg by mouth at bedtime.      furosemide  (LASIX ) 20 MG tablet Take 2 tablets (40 mg total) by mouth daily. 180 tablet 0   loratadine  (CLARITIN ) 10 MG tablet Take 10 mg by mouth daily as needed for allergies.     losartan  (COZAAR ) 100 MG tablet Take 100 mg by mouth daily.     omeprazole (PRILOSEC) 20 MG capsule Take 20 mg by mouth daily as needed (acid reflux).     pravastatin  (PRAVACHOL ) 20 MG tablet Take 20 mg by mouth at bedtime.      vitamin B-12 (CYANOCOBALAMIN ) 1000 MCG tablet Take 2,000 mcg by mouth every evening.      Cholecalciferol (VITAMIN D) 2000 UNITS tablet Take 2,000 Units by mouth at bedtime.  (Patient not taking: Reported on 12/09/2023)     No current facility-administered medications for this visit.     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   21 mm Edwards Intuity rapid deployment bovine pericardial tissue valve via partial upper mini sternotomy approach    Past Surgical History:  Procedure Laterality Date   ABDOMINAL HYSTERECTOMY  1985   AORTIC VALVE REPLACEMENT N/A 09/17/2016   Procedure: MINIMALLY INVASIVE AORTIC VALVE REPLACEMENT (AVR) THROUGH PARTIAL STERNOTOMY USING A EDWARDS INTUITY ELITE VALVE;  Surgeon: Sudie VEAR Laine, MD;  Location: [facility] OR;  Service: Open Heart Surgery;  Laterality: N/A;   CARPAL TUNNEL RELEASE  1985   rt hand   CHOLECYSTECTOMY  01/03/2013   Procedure: LAPAROSCOPIC  CHOLECYSTECTOMY attempted cholangiogram;  Surgeon: Lynda Leos, MD;  Location: [facility] OR;  Service: General;;   DILATION AND CURETTAGE OF UTERUS     several   HAMMER TOE SURGERY  10/01/2011   left toe   LYMPH NODE BIOPSY  06/14/2012   Procedure: LYMPH NODE BIOPSY;  Surgeon: Sherlean JINNY Laughter, MD;  Location:  SURGERY CENTER;  Service: General;  Laterality: Left;  remove left neck lymph node   TEE WITHOUT CARDIOVERSION N/A 09/17/2016   Procedure: TRANSESOPHAGEAL ECHOCARDIOGRAM (TEE);  Surgeon: Sudie VEAR Laine, MD;  Location[facility];  Service: Open Heart Surgery;  Laterality: N/A;   TONSILLECTOMY     TOTAL KNEE ARTHROPLASTY Right 10/29/2018   Procedure: TOTAL KNEE ARTHROPLASTY;  Surgeon: Gerome Charleston, MD;  Location: [facility];  Service: Orthopedics;  Laterality: Right;  with adductor canal   TUBAL LIGATION      Social History   Socioeconomic History   Marital status: Widowed    Spouse name: Not on file   Number of children: 1   Years of education: Not on file   Highest education level: Not on file  Occupational History   Not on file  Tobacco Use   Smoking status: Never   Smokeless tobacco: Never  Vaping Use   Vaping status: Never Used  Substance and Sexual Activity   Alcohol use: Yes    Comment: occ   Drug use: No   Sexual activity: Not Currently  Other Topics Concern   Not on file  Social History Narrative   Not on file   Social Drivers of Health   Financial Resource Strain: Not on file  Food Insecurity: Not on file  Transportation Needs: Not on file  Physical Activity: Not on file  Stress: Not on file  Social Connections: Not on file  Intimate Partner Violence: Not on file    Family History  Problem Relation Age of Onset   Hypertension Mother    Aortic stenosis Mother    COPD Father    Stroke Maternal Grandfather     ROS: no fevers or chills, productive cough, hemoptysis, dysphasia, odynophagia, melena, hematochezia, dysuria, hematuria, rash, seizure  activity, orthopnea, PND, pedal edema, claudication. Remaining systems are negative.  Physical Exam: Well-developed well-nourished in no acute distress.  Skin is warm and dry.  HEENT is normal.  Neck is supple.  Chest is clear to auscultation with normal expansion.  Cardiovascular exam is regular rate and rhythm.  2/6 systolic murmur left sternal border.  No diastolic murmur noted. Abdominal exam nontender or distended. No masses palpated. Extremities show trace edema. neuro grossly intact  EKG Interpretation Date/Time:  Wednesday December 09 2023 11:09:04 EDT Ventricular Rate:  72 PR Interval:  144 QRS Duration:  78 QT Interval:  372 QTC Calculation: 407 R Axis:   -27  Text Interpretation: Normal sinus rhythm Normal ECG Confirmed by Pietro,  Redell ([id number]) on 12/09/2023 11:10:07 AM    A/P  1 status post aortic valve replacement-continue SBE prophylaxis.  2 hyperlipidemia-continue statin.   3 hypertension-patient's blood pressure is controlled.  Continue present medical regimen.  4 palpitations-symptoms are controlled.  Continue beta-blocker at present dose.  5 lower extremity edema-continue diuretic at present dose.  Potassium and renal function monitored by primary care and rheumatology.  Redell Shallow, MD

## 2023-12-09 ENCOUNTER — Ambulatory Visit: Attending: Cardiovascular Disease | Admitting: Cardiology

## 2023-12-09 ENCOUNTER — Encounter: Payer: Self-pay | Admitting: Cardiology

## 2023-12-09 VITALS — BP 120/62 | HR 72 | Ht 65.0 in | Wt 197.0 lb

## 2023-12-09 DIAGNOSIS — Z952 Presence of prosthetic heart valve: Secondary | ICD-10-CM | POA: Diagnosis present

## 2023-12-09 DIAGNOSIS — I10 Essential (primary) hypertension: Secondary | ICD-10-CM | POA: Insufficient documentation

## 2023-12-09 DIAGNOSIS — E782 Mixed hyperlipidemia: Secondary | ICD-10-CM | POA: Diagnosis present

## 2023-12-09 DIAGNOSIS — R002 Palpitations: Secondary | ICD-10-CM | POA: Insufficient documentation

## 2023-12-09 NOTE — Patient Instructions (Signed)
 Medication Instructions:  Your physician recommends that you continue on your current medications as directed. Please refer to the Current Medication list given to you today.  *If you need a refill on your cardiac medications before your next appointment, please call your pharmacy*   Follow-Up: At [facility], you and your health needs are our priority.  As part of our continuing mission to provide you with exceptional heart care, our providers are all part of one team.  This team includes your primary Cardiologist (physician) and Advanced Practice Providers or APPs (Physician Assistants and Nurse Practitioners) who all work together to provide you with the care you need, when you need it.  Your next appointment:   1 year(s)  Provider:   Redell Shallow, MD

## 2024-02-22 ENCOUNTER — Other Ambulatory Visit: Payer: Self-pay | Admitting: Cardiology

## 2024-02-24 LAB — LAB REPORT - SCANNED
EGFR: 63
TSH: 1.33 (ref 0.41–5.90)
